# Patient Record
Sex: Male | Born: 1950 | Race: Black or African American | Hispanic: No | Marital: Single | Smoking: Current every day smoker
Health system: Southern US, Community
[De-identification: ages and names within clinical notes are randomized; demographics above are authoritative.]

## PROBLEM LIST (undated history)

## (undated) DIAGNOSIS — K469 Unspecified abdominal hernia without obstruction or gangrene: Secondary | ICD-10-CM

## (undated) DIAGNOSIS — K769 Liver disease, unspecified: Secondary | ICD-10-CM

## (undated) DIAGNOSIS — N1330 Unspecified hydronephrosis: Secondary | ICD-10-CM

## (undated) DIAGNOSIS — R569 Unspecified convulsions: Secondary | ICD-10-CM

## (undated) DIAGNOSIS — K8020 Calculus of gallbladder without cholecystitis without obstruction: Secondary | ICD-10-CM

## (undated) DIAGNOSIS — I8500 Esophageal varices without bleeding: Secondary | ICD-10-CM

## (undated) DIAGNOSIS — N888 Other specified noninflammatory disorders of cervix uteri: Secondary | ICD-10-CM

## (undated) DIAGNOSIS — F1020 Alcohol dependence, uncomplicated: Secondary | ICD-10-CM

## (undated) DIAGNOSIS — E7800 Pure hypercholesterolemia, unspecified: Secondary | ICD-10-CM

## (undated) DIAGNOSIS — C799 Secondary malignant neoplasm of unspecified site: Secondary | ICD-10-CM

## (undated) DIAGNOSIS — R918 Other nonspecific abnormal finding of lung field: Secondary | ICD-10-CM

## (undated) DIAGNOSIS — E46 Unspecified protein-calorie malnutrition: Secondary | ICD-10-CM

## (undated) DIAGNOSIS — N400 Enlarged prostate without lower urinary tract symptoms: Secondary | ICD-10-CM

## (undated) DIAGNOSIS — D649 Anemia, unspecified: Secondary | ICD-10-CM

## (undated) HISTORY — PX: APPENDECTOMY: SHX54

---

## 2000-11-24 ENCOUNTER — Encounter: Payer: Self-pay | Admitting: *Deleted

## 2000-11-24 ENCOUNTER — Emergency Department (HOSPITAL_COMMUNITY): Admission: EM | Admit: 2000-11-24 | Discharge: 2000-11-24 | Payer: Self-pay | Admitting: *Deleted

## 2003-06-19 ENCOUNTER — Emergency Department (HOSPITAL_COMMUNITY): Admission: EM | Admit: 2003-06-19 | Discharge: 2003-06-19 | Payer: Self-pay | Admitting: Emergency Medicine

## 2004-02-26 ENCOUNTER — Emergency Department (HOSPITAL_COMMUNITY): Admission: EM | Admit: 2004-02-26 | Discharge: 2004-02-26 | Payer: Self-pay | Admitting: Emergency Medicine

## 2004-06-23 ENCOUNTER — Emergency Department (HOSPITAL_COMMUNITY): Admission: EM | Admit: 2004-06-23 | Discharge: 2004-06-23 | Payer: Self-pay | Admitting: Emergency Medicine

## 2004-07-18 ENCOUNTER — Emergency Department (HOSPITAL_COMMUNITY): Admission: EM | Admit: 2004-07-18 | Discharge: 2004-07-18 | Payer: Self-pay | Admitting: Emergency Medicine

## 2006-12-03 ENCOUNTER — Emergency Department (HOSPITAL_COMMUNITY): Admission: EM | Admit: 2006-12-03 | Discharge: 2006-12-03 | Payer: Self-pay | Admitting: Emergency Medicine

## 2008-04-29 ENCOUNTER — Inpatient Hospital Stay (HOSPITAL_COMMUNITY): Admission: EM | Admit: 2008-04-29 | Discharge: 2008-05-07 | Payer: Self-pay | Admitting: Emergency Medicine

## 2008-05-04 ENCOUNTER — Ambulatory Visit: Payer: Self-pay | Admitting: Internal Medicine

## 2008-05-06 ENCOUNTER — Ambulatory Visit: Payer: Self-pay | Admitting: Internal Medicine

## 2008-06-03 ENCOUNTER — Encounter: Payer: Self-pay | Admitting: Internal Medicine

## 2008-07-31 ENCOUNTER — Emergency Department (HOSPITAL_COMMUNITY): Admission: EM | Admit: 2008-07-31 | Discharge: 2008-07-31 | Payer: Self-pay | Admitting: Emergency Medicine

## 2008-08-13 ENCOUNTER — Inpatient Hospital Stay (HOSPITAL_COMMUNITY): Admission: EM | Admit: 2008-08-13 | Discharge: 2008-08-20 | Payer: Self-pay | Admitting: Emergency Medicine

## 2008-08-21 ENCOUNTER — Emergency Department (HOSPITAL_COMMUNITY): Admission: EM | Admit: 2008-08-21 | Discharge: 2008-08-21 | Payer: Self-pay | Admitting: Emergency Medicine

## 2009-08-19 ENCOUNTER — Emergency Department (HOSPITAL_COMMUNITY): Admission: RE | Admit: 2009-08-19 | Discharge: 2009-08-19 | Payer: Self-pay | Admitting: Family Medicine

## 2010-01-03 ENCOUNTER — Ambulatory Visit: Payer: Self-pay | Admitting: Cardiology

## 2010-01-03 ENCOUNTER — Inpatient Hospital Stay (HOSPITAL_COMMUNITY): Admission: EM | Admit: 2010-01-03 | Discharge: 2010-01-06 | Payer: Self-pay | Admitting: Emergency Medicine

## 2010-01-04 ENCOUNTER — Encounter: Payer: Self-pay | Admitting: Cardiology

## 2010-01-13 ENCOUNTER — Ambulatory Visit: Payer: Self-pay | Admitting: Cardiology

## 2010-01-13 ENCOUNTER — Encounter (HOSPITAL_COMMUNITY)
Admission: RE | Admit: 2010-01-13 | Discharge: 2010-02-12 | Payer: Self-pay | Source: Home / Self Care | Admitting: Cardiology

## 2010-05-22 ENCOUNTER — Inpatient Hospital Stay (HOSPITAL_COMMUNITY)
Admission: RE | Admit: 2010-05-22 | Discharge: 2010-05-26 | DRG: 638 | Disposition: A | Discharge status: Home Health | Payer: Medicare Other | Source: Ambulatory Visit | Attending: Internal Medicine | Admitting: Internal Medicine

## 2010-05-22 DIAGNOSIS — N390 Urinary tract infection, site not specified: Secondary | ICD-10-CM | POA: Diagnosis present

## 2010-05-22 DIAGNOSIS — IMO0001 Reserved for inherently not codable concepts without codable children: Principal | ICD-10-CM | POA: Diagnosis present

## 2010-05-22 DIAGNOSIS — E871 Hypo-osmolality and hyponatremia: Secondary | ICD-10-CM | POA: Diagnosis present

## 2010-05-22 DIAGNOSIS — B379 Candidiasis, unspecified: Secondary | ICD-10-CM | POA: Diagnosis present

## 2010-05-22 DIAGNOSIS — R269 Unspecified abnormalities of gait and mobility: Secondary | ICD-10-CM | POA: Diagnosis present

## 2010-05-22 DIAGNOSIS — I10 Essential (primary) hypertension: Secondary | ICD-10-CM | POA: Diagnosis present

## 2010-05-22 DIAGNOSIS — Z794 Long term (current) use of insulin: Secondary | ICD-10-CM

## 2010-05-22 LAB — CBC
HCT: 41.5 % (ref 39.0–52.0)
Hemoglobin: 13.7 g/dL (ref 13.0–17.0)
MCHC: 33 g/dL (ref 30.0–36.0)
MCV: 79.3 fL (ref 78.0–100.0)
RDW: 13.1 % (ref 11.5–15.5)

## 2010-05-22 LAB — BASIC METABOLIC PANEL
CO2: 26 mEq/L (ref 19–32)
Calcium: 9.2 mg/dL (ref 8.4–10.5)
Creatinine, Ser: 0.96 mg/dL (ref 0.4–1.5)
GFR calc Af Amer: >60 mL/min (ref >60)
GFR calc non Af Amer: >60 mL/min (ref >60)
Sodium: 124 mEq/L — ABNORMAL LOW (ref 135–145)

## 2010-05-22 LAB — URINE MICROSCOPIC-ADD ON

## 2010-05-22 LAB — URINALYSIS, ROUTINE W REFLEX MICROSCOPIC
Bilirubin Urine: NEGATIVE
Glucose, UA: >1000 mg/dL — AB
Hgb urine dipstick: NEGATIVE
Specific Gravity, Urine: <1.005 — ABNORMAL LOW (ref 1.005–1.030)
Urobilinogen, UA: 0.2 mg/dL (ref 0.0–1.0)

## 2010-05-22 LAB — DIFFERENTIAL
Eosinophils Relative: 1 % (ref 0–5)
Lymphocytes Relative: 43 % (ref 12–46)
Lymphs Abs: 2.9 10*3/uL (ref 0.7–4.0)
Monocytes Absolute: 0.3 10*3/uL (ref 0.1–1.0)

## 2010-05-22 LAB — GLUCOSE, CAPILLARY
Glucose-Capillary: 309 mg/dL — ABNORMAL HIGH (ref 70–99)
Glucose-Capillary: 503 mg/dL — ABNORMAL HIGH (ref 70–99)
Glucose-Capillary: >600 mg/dL (ref 70–99)

## 2010-05-23 ENCOUNTER — Inpatient Hospital Stay (HOSPITAL_COMMUNITY): Payer: Medicare Other

## 2010-05-23 LAB — GLUCOSE, CAPILLARY
Glucose-Capillary: 198 mg/dL — ABNORMAL HIGH (ref 70–99)
Glucose-Capillary: 148 mg/dL — ABNORMAL HIGH (ref 70–99)
Glucose-Capillary: 159 mg/dL — ABNORMAL HIGH (ref 70–99)
Glucose-Capillary: 268 mg/dL — ABNORMAL HIGH (ref 70–99)

## 2010-05-23 LAB — LACTIC ACID, PLASMA: Lactic Acid, Venous: 1.1 mmol/L (ref 0.5–2.2)

## 2010-05-23 LAB — CARDIAC PANEL(CRET KIN+CKTOT+MB+TROPI)
CK, MB: 1.6 ng/mL (ref 0.3–4.0)
Relative Index: INVALID (ref 0.0–2.5)
Total CK: 27 U/L (ref 7–232)
Total CK: 31 U/L (ref 7–232)
Troponin I: 0.01 ng/mL (ref 0.00–0.06)
Troponin I: 0.02 ng/mL (ref 0.00–0.06)

## 2010-05-23 LAB — CBC
HCT: 38 % — ABNORMAL LOW (ref 39.0–52.0)
Hemoglobin: 13.2 g/dL (ref 13.0–17.0)
MCH: 27.2 pg (ref 26.0–34.0)
MCV: 78.2 fL (ref 78.0–100.0)
RBC: 4.86 MIL/uL (ref 4.22–5.81)

## 2010-05-23 LAB — DIFFERENTIAL
Lymphocytes Relative: 49 % — ABNORMAL HIGH (ref 12–46)
Lymphs Abs: 4.1 10*3/uL — ABNORMAL HIGH (ref 0.7–4.0)
Monocytes Relative: 8 % (ref 3–12)
Neutro Abs: 3.5 10*3/uL (ref 1.7–7.7)
Neutrophils Relative %: 41 % — ABNORMAL LOW (ref 43–77)

## 2010-05-23 LAB — COMPREHENSIVE METABOLIC PANEL
ALT: 30 U/L (ref 0–53)
AST: 28 U/L (ref 0–37)
Calcium: 9 mg/dL (ref 8.4–10.5)
GFR calc Af Amer: >60 mL/min (ref >60)
Sodium: 133 mEq/L — ABNORMAL LOW (ref 135–145)
Total Protein: 6.6 g/dL (ref 6.0–8.3)
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
## 2010-05-24 LAB — CBC
Hemoglobin: 12.3 g/dL — ABNORMAL LOW (ref 13.0–17.0)
MCH: 27.2 pg (ref 26.0–34.0)
Platelets: 176 10*3/uL (ref 150–400)
RBC: 4.52 MIL/uL (ref 4.22–5.81)
WBC: 6.5 10*3/uL (ref 4.0–10.5)

## 2010-05-24 LAB — DIFFERENTIAL
Basophils Absolute: 0 10*3/uL (ref 0.0–0.1)
Basophils Relative: 0 % (ref 0–1)
Monocytes Relative: 8 % (ref 3–12)
Neutro Abs: 2.6 10*3/uL (ref 1.7–7.7)
Neutrophils Relative %: 40 % — ABNORMAL LOW (ref 43–77)

## 2010-05-24 LAB — BASIC METABOLIC PANEL
CO2: 25 mEq/L (ref 19–32)
Chloride: 98 mEq/L (ref 96–112)
GFR calc Af Amer: >60 mL/min (ref >60)
Potassium: 3.5 mEq/L (ref 3.5–5.1)
Sodium: 129 mEq/L — ABNORMAL LOW (ref 135–145)

## 2010-05-24 LAB — URINE CULTURE
Colony Count: 75000
Culture  Setup Time: 201203042325

## 2010-05-24 LAB — HEMOGLOBIN A1C
Hgb A1c MFr Bld: >20 % — ABNORMAL HIGH (ref <5.7)
Mean Plasma Glucose: 484 mg/dL — ABNORMAL HIGH (ref <117)

## 2010-05-24 LAB — GLUCOSE, CAPILLARY
Glucose-Capillary: 58 mg/dL — ABNORMAL LOW (ref 70–99)
Glucose-Capillary: 281 mg/dL — ABNORMAL HIGH (ref 70–99)
Glucose-Capillary: 218 mg/dL — ABNORMAL HIGH (ref 70–99)

## 2010-05-25 LAB — BASIC METABOLIC PANEL
Chloride: 104 mEq/L (ref 96–112)
GFR calc Af Amer: >60 mL/min (ref >60)
Potassium: 3.5 mEq/L (ref 3.5–5.1)
Sodium: 137 mEq/L (ref 135–145)

## 2010-05-25 LAB — GLUCOSE, CAPILLARY
Glucose-Capillary: 131 mg/dL — ABNORMAL HIGH (ref 70–99)
Glucose-Capillary: 336 mg/dL — ABNORMAL HIGH (ref 70–99)
Glucose-Capillary: 182 mg/dL — ABNORMAL HIGH (ref 70–99)

## 2010-05-25 LAB — CBC
Hemoglobin: 11.9 g/dL — ABNORMAL LOW (ref 13.0–17.0)
MCH: 27.2 pg (ref 26.0–34.0)
MCV: 79.5 fL (ref 78.0–100.0)
RBC: 4.38 MIL/uL (ref 4.22–5.81)
WBC: 5.9 10*3/uL (ref 4.0–10.5)

## 2010-05-25 LAB — DIFFERENTIAL
Lymphocytes Relative: 42 % (ref 12–46)
Lymphs Abs: 2.5 10*3/uL (ref 0.7–4.0)
Monocytes Relative: 8 % (ref 3–12)
Neutro Abs: 2.8 10*3/uL (ref 1.7–7.7)
Neutrophils Relative %: 48 % (ref 43–77)

## 2010-05-26 LAB — CBC
HCT: 35.1 % — ABNORMAL LOW (ref 39.0–52.0)
MCHC: 33.9 g/dL (ref 30.0–36.0)
RDW: 13.6 % (ref 11.5–15.5)
WBC: 5.8 10*3/uL (ref 4.0–10.5)

## 2010-05-26 LAB — GLUCOSE, CAPILLARY
Glucose-Capillary: 125 mg/dL — ABNORMAL HIGH (ref 70–99)
Glucose-Capillary: 81 mg/dL (ref 70–99)

## 2010-05-26 LAB — DIFFERENTIAL
Basophils Absolute: 0 10*3/uL (ref 0.0–0.1)
Basophils Relative: 0 % (ref 0–1)
Eosinophils Relative: 2 % (ref 0–5)
Lymphocytes Relative: 45 % (ref 12–46)
Monocytes Absolute: 0.4 10*3/uL (ref 0.1–1.0)
Neutro Abs: 2.6 10*3/uL (ref 1.7–7.7)

## 2010-05-26 LAB — COMPREHENSIVE METABOLIC PANEL
ALT: 20 U/L (ref 0–53)
AST: 32 U/L (ref 0–37)
Albumin: 2.6 g/dL — ABNORMAL LOW (ref 3.5–5.2)
Alkaline Phosphatase: 109 U/L (ref 39–117)
Calcium: 8.6 mg/dL (ref 8.4–10.5)
GFR calc Af Amer: >60 mL/min (ref >60)
Glucose, Bld: 84 mg/dL (ref 70–99)
Potassium: 3.7 mEq/L (ref 3.5–5.1)
Sodium: 135 mEq/L (ref 135–145)
Total Protein: 5.8 g/dL — ABNORMAL LOW (ref 6.0–8.3)

## 2010-05-27 NOTE — Discharge Summary (Signed)
  NAME:  [name], [name]               ACCOUNT NO.:  [id number]  MEDICAL RECORD NO.:  [id number]           PATIENT TYPE:  I  LOCATION:  A209                          FACILITY:  [facility]  PHYSICIAN:  Wilson Singer, M.D.DATE OF BIRTH:  1950-07-20  DATE OF ADMISSION:  05/22/2010 DATE OF DISCHARGE:  03/08/2012LH                              DISCHARGE SUMMARY   FINAL DISCHARGE DIAGNOSIS:  Uncontrolled diabetes with initial blood glucose of 642, not in ketoacidosis, resolved.  CONDITION ON DISCHARGE:  Stable.  MEDICATIONS ON DISCHARGE:  Lantus insulin 10 units daily.  HISTORY:  This is an unfortunate 60 year old African American man with a history of hypertension and diabetes and apparently has seizure disorder, presented with difficulty in walking and excessive thirst of unspecified duration.  The patient when he presented had a bicarbonate of 26, but glucose of 642, BUN of 20, and creatinine 0.96.  Please see initial history and physical examination done by Dr. Beverly Gust.  HOSPITAL COURSE:  The patient was given intravenous fluids and intravenous insulin initially.  He did very well with this and stabilized over the next few days.  Unfortunately he cannot recall his medications but he does know that he is taking insulin injections.  As far as dosage is concerned it is not entirely clear and 10 units of Lantus insulin seems to have been controlling sugars reasonably well. Interestingly, his hemoglobin A1c is greater than 20%!  This would indicate severe lack of control of his diabetes.  Today he is doing well, eating well without any major problems.  PHYSICAL EXAMINATION:  VITAL SIGNS:  His temperature is 99, blood pressure 100/64, pulse 83, and saturation 98% on room air. 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 HEART: Sounds are present and normal. LUNG:  Fields are clear.  INVESTIGATIONS:   Today show sodium of 135, potassium 3.7, bicarbonate 26, BUN 9, creatinine 0.72, albumin 2.6, hemoglobin 11.9, white blood cell count 5.8, and platelets 152.  His CBGs are coming under much better control although there have been some high sugars in the upper 200s but overall they are in the range of 100-125.  DISPOSITION:  The patient appears to be stable to be discharged home.  I believe the home situation is a little bit challenging because he apparently lives with a niece to my understanding.  The niece apparently manages his finances and so we will try and work with Home Health Care on this in terms of checking his sugars, making sure he is getting the right amount of insulin at home.  He does need followup with a primary care physician.     Wilson Singer, M.D.     NCG/MEDQ  D:  05/26/2010  T:  05/27/2010  Job:  [id number]  Electronically Signed by Lilly Cove M.D. on 05/27/2010 12:40:00 PM

## 2010-05-28 LAB — CULTURE, BLOOD (ROUTINE X 2): Culture: NO GROWTH

## 2010-05-29 NOTE — Discharge Summary (Signed)
  NAME:  [name], [name]               ACCOUNT NO.:  [id number]  MEDICAL RECORD NO.:  [id number]           PATIENT TYPE:  LOCATION:                                 FACILITY:  PHYSICIAN:  Wilson Singer, M.D.DATE OF BIRTH:  Aug 24, 1950  DATE OF ADMISSION: DATE OF DISCHARGE:  LH                              DISCHARGE SUMMARY   ADDENDUM  I have located the patient's home medications and he should actually continue on home medications listed below:  1. Ativan 1 mg b.i.d. 2. Aspirin 81 mg daily. 3. Folic acid 1 mg daily. 4. Mucinex 1 tablet b.i.d. 5. Lantus insulin 28 units daily nightly. 6. Metoprolol 25 mg half a tablet b.i.d. 7. Multivitamins 1 tablet daily. 8. Saxagliptin 5 mg daily. 9. Thiamine 100 mg daily.  He clearly needs to follow up with his primary care physician in the next 1-2 weeks.     Wilson Singer, M.D.     NCG/MEDQ  D:  05/26/2010  T:  05/27/2010  Job:  [id number]  Electronically Signed by Lilly Cove M.D. on 05/29/2010 12:45:19 PM

## 2010-06-01 LAB — RAPID URINE DRUG SCREEN, HOSP PERFORMED
Amphetamines: NOT DETECTED
Barbiturates: NOT DETECTED
Benzodiazepines: NOT DETECTED
Cocaine: NOT DETECTED
Opiates: NOT DETECTED
Tetrahydrocannabinol: NOT DETECTED

## 2010-06-01 LAB — CBC
HCT: 34.3 % — ABNORMAL LOW (ref 39.0–52.0)
HCT: 39 % (ref 39.0–52.0)
HCT: 39.5 % (ref 39.0–52.0)
Hemoglobin: 11.4 g/dL — ABNORMAL LOW (ref 13.0–17.0)
Hemoglobin: 12.8 g/dL — ABNORMAL LOW (ref 13.0–17.0)
MCH: 28 pg (ref 26.0–34.0)
MCH: 28.1 pg (ref 26.0–34.0)
MCHC: 32.9 g/dL (ref 30.0–36.0)
MCHC: 33 g/dL (ref 30.0–36.0)
MCHC: 33.2 g/dL (ref 30.0–36.0)
MCV: 84.8 fL (ref 78.0–100.0)
MCV: 84.8 fL (ref 78.0–100.0)
MCV: 85.1 fL (ref 78.0–100.0)
Platelets: 114 10*3/uL — ABNORMAL LOW (ref 150–400)
Platelets: 138 10*3/uL — ABNORMAL LOW (ref 150–400)
RBC: 4.04 MIL/uL — ABNORMAL LOW (ref 4.22–5.81)
RBC: 4.59 MIL/uL (ref 4.22–5.81)
RDW: 13.3 % (ref 11.5–15.5)
RDW: 13.3 % (ref 11.5–15.5)
RDW: 13.5 % (ref 11.5–15.5)
WBC: 9.4 10*3/uL (ref 4.0–10.5)
WBC: 9.6 10*3/uL (ref 4.0–10.5)

## 2010-06-01 LAB — BASIC METABOLIC PANEL
BUN: 12 mg/dL (ref 6–23)
BUN: 7 mg/dL (ref 6–23)
BUN: 7 mg/dL (ref 6–23)
CO2: 25 mEq/L (ref 19–32)
CO2: 28 mEq/L (ref 19–32)
Calcium: 8 mg/dL — ABNORMAL LOW (ref 8.4–10.5)
Calcium: 9.1 mg/dL (ref 8.4–10.5)
Chloride: 100 mEq/L (ref 96–112)
Chloride: 87 mEq/L — ABNORMAL LOW (ref 96–112)
Chloride: 96 mEq/L (ref 96–112)
Creatinine, Ser: 0.55 mg/dL (ref 0.4–1.5)
Creatinine, Ser: 0.58 mg/dL (ref 0.4–1.5)
Creatinine, Ser: 0.68 mg/dL (ref 0.4–1.5)
GFR calc Af Amer: >60 mL/min (ref >60)
GFR calc Af Amer: >60 mL/min (ref >60)
GFR calc non Af Amer: >60 mL/min (ref >60)
GFR calc non Af Amer: >60 mL/min (ref >60)
GFR calc non Af Amer: >60 mL/min (ref >60)
Glucose, Bld: 195 mg/dL — ABNORMAL HIGH (ref 70–99)
Glucose, Bld: 317 mg/dL — ABNORMAL HIGH (ref 70–99)
Glucose, Bld: 401 mg/dL — ABNORMAL HIGH (ref 70–99)
Potassium: 3.8 mEq/L (ref 3.5–5.1)
Potassium: 4.1 mEq/L (ref 3.5–5.1)
Potassium: 4.2 mEq/L (ref 3.5–5.1)
Sodium: 128 mEq/L — ABNORMAL LOW (ref 135–145)
Sodium: 137 mEq/L (ref 135–145)

## 2010-06-01 LAB — CARDIAC PANEL(CRET KIN+CKTOT+MB+TROPI)
CK, MB: 10.1 ng/mL (ref 0.3–4.0)
CK, MB: 3.4 ng/mL (ref 0.3–4.0)
CK, MB: 4.1 ng/mL — ABNORMAL HIGH (ref 0.3–4.0)
CK, MB: 4.5 ng/mL — ABNORMAL HIGH (ref 0.3–4.0)
Relative Index: INVALID (ref 0.0–2.5)
Relative Index: INVALID (ref 0.0–2.5)
Total CK: 28 U/L (ref 7–232)
Total CK: 31 U/L (ref 7–232)
Troponin I: 0.01 ng/mL (ref 0.00–0.06)
Troponin I: 0.25 ng/mL — ABNORMAL HIGH (ref 0.00–0.06)

## 2010-06-01 LAB — GLUCOSE, CAPILLARY
Glucose-Capillary: 104 mg/dL — ABNORMAL HIGH (ref 70–99)
Glucose-Capillary: 141 mg/dL — ABNORMAL HIGH (ref 70–99)
Glucose-Capillary: 178 mg/dL — ABNORMAL HIGH (ref 70–99)
Glucose-Capillary: 180 mg/dL — ABNORMAL HIGH (ref 70–99)
Glucose-Capillary: 234 mg/dL — ABNORMAL HIGH (ref 70–99)
Glucose-Capillary: 279 mg/dL — ABNORMAL HIGH (ref 70–99)
Glucose-Capillary: 302 mg/dL — ABNORMAL HIGH (ref 70–99)
Glucose-Capillary: >600 mg/dL (ref 70–99)
Glucose-Capillary: >600 mg/dL (ref 70–99)

## 2010-06-01 LAB — DIFFERENTIAL
Basophils Absolute: 0 10*3/uL (ref 0.0–0.1)
Basophils Absolute: 0 10*3/uL (ref 0.0–0.1)
Basophils Absolute: 0 10*3/uL (ref 0.0–0.1)
Basophils Relative: 0 % (ref 0–1)
Basophils Relative: 0 % (ref 0–1)
Basophils Relative: 0 % (ref 0–1)
Eosinophils Absolute: 0 10*3/uL (ref 0.0–0.7)
Eosinophils Absolute: 0 10*3/uL (ref 0.0–0.7)
Eosinophils Relative: 0 % (ref 0–5)
Eosinophils Relative: 0 % (ref 0–5)
Eosinophils Relative: 0 % (ref 0–5)
Lymphocytes Relative: 29 % (ref 12–46)
Lymphocytes Relative: 7 % — ABNORMAL LOW (ref 12–46)
Lymphs Abs: 0.6 10*3/uL — ABNORMAL LOW (ref 0.7–4.0)
Lymphs Abs: 2.8 10*3/uL (ref 0.7–4.0)
Monocytes Absolute: 0.2 10*3/uL (ref 0.1–1.0)
Monocytes Absolute: 0.6 10*3/uL (ref 0.1–1.0)
Monocytes Absolute: 0.7 10*3/uL (ref 0.1–1.0)
Monocytes Relative: 3 % (ref 3–12)
Monocytes Relative: 6 % (ref 3–12)
Neutro Abs: 6.2 10*3/uL (ref 1.7–7.7)
Neutro Abs: 8.5 10*3/uL — ABNORMAL HIGH (ref 1.7–7.7)
Neutrophils Relative %: 65 % (ref 43–77)
Neutrophils Relative %: 91 % — ABNORMAL HIGH (ref 43–77)

## 2010-06-01 LAB — HEPARIN LEVEL (UNFRACTIONATED)
Heparin Unfractionated: 0.23 IU/mL — ABNORMAL LOW (ref 0.30–0.70)
Heparin Unfractionated: 0.31 IU/mL (ref 0.30–0.70)
Heparin Unfractionated: 0.45 IU/mL (ref 0.30–0.70)
Heparin Unfractionated: <0.1 IU/mL — ABNORMAL LOW (ref 0.30–0.70)

## 2010-06-01 LAB — POCT CARDIAC MARKERS
CKMB, poc: <1 ng/mL — ABNORMAL LOW (ref 1.0–8.0)
CKMB, poc: <1 ng/mL — ABNORMAL LOW (ref 1.0–8.0)
Myoglobin, poc: 50.4 ng/mL (ref 12–200)
Troponin i, poc: <0.05 ng/mL (ref 0.00–0.09)
Troponin i, poc: <0.05 ng/mL (ref 0.00–0.09)

## 2010-06-01 LAB — COMPREHENSIVE METABOLIC PANEL
ALT: 50 U/L (ref 0–53)
AST: 55 U/L — ABNORMAL HIGH (ref 0–37)
Albumin: 3 g/dL — ABNORMAL LOW (ref 3.5–5.2)
Alkaline Phosphatase: 196 U/L — ABNORMAL HIGH (ref 39–117)
BUN: 8 mg/dL (ref 6–23)
CO2: 27 mEq/L (ref 19–32)
Calcium: 8.7 mg/dL (ref 8.4–10.5)
Chloride: 91 mEq/L — ABNORMAL LOW (ref 96–112)
Creatinine, Ser: 0.65 mg/dL (ref 0.4–1.5)
GFR calc Af Amer: >60 mL/min (ref >60)
GFR calc non Af Amer: >60 mL/min (ref >60)
Glucose, Bld: 395 mg/dL — ABNORMAL HIGH (ref 70–99)
Potassium: 4.2 mEq/L (ref 3.5–5.1)
Sodium: 128 mEq/L — ABNORMAL LOW (ref 135–145)
Total Bilirubin: 0.9 mg/dL (ref 0.3–1.2)
Total Protein: 7.7 g/dL (ref 6.0–8.3)

## 2010-06-01 LAB — GLUCOSE, RANDOM
Glucose, Bld: 331 mg/dL — ABNORMAL HIGH (ref 70–99)
Glucose, Bld: 401 mg/dL — ABNORMAL HIGH (ref 70–99)
Glucose, Bld: 498 mg/dL — ABNORMAL HIGH (ref 70–99)

## 2010-06-01 LAB — HEMOGLOBIN A1C
Hgb A1c MFr Bld: 17.8 % — ABNORMAL HIGH (ref <5.7)
Mean Plasma Glucose: 464 mg/dL — ABNORMAL HIGH (ref <117)

## 2010-06-01 LAB — LIPID PANEL
LDL Cholesterol: 58 mg/dL (ref 0–99)
VLDL: 13 mg/dL (ref 0–40)

## 2010-06-01 LAB — BRAIN NATRIURETIC PEPTIDE
Pro B Natriuretic peptide (BNP): <30 pg/mL (ref 0.0–100.0)
Pro B Natriuretic peptide (BNP): <30 pg/mL (ref 0.0–100.0)

## 2010-06-01 LAB — ETHANOL: Alcohol, Ethyl (B): <5 mg/dL (ref 0–10)

## 2010-06-01 LAB — TSH: TSH: 0.413 u[IU]/mL (ref 0.350–4.500)

## 2010-06-01 LAB — D-DIMER, QUANTITATIVE: D-Dimer, Quant: 0.65 ug/mL-FEU — ABNORMAL HIGH (ref 0.00–0.48)

## 2010-06-27 LAB — BASIC METABOLIC PANEL
BUN: 6 mg/dL (ref 6–23)
Calcium: 9.1 mg/dL (ref 8.4–10.5)
Chloride: 96 mEq/L (ref 96–112)
Chloride: 98 mEq/L (ref 96–112)
Creatinine, Ser: 0.72 mg/dL (ref 0.4–1.5)
GFR calc Af Amer: >60 mL/min (ref >60)
GFR calc non Af Amer: >60 mL/min (ref >60)
GFR calc non Af Amer: >60 mL/min (ref >60)
Glucose, Bld: 167 mg/dL — ABNORMAL HIGH (ref 70–99)
Potassium: 3.8 mEq/L (ref 3.5–5.1)
Sodium: 133 mEq/L — ABNORMAL LOW (ref 135–145)

## 2010-06-27 LAB — GLUCOSE, CAPILLARY
Glucose-Capillary: 189 mg/dL — ABNORMAL HIGH (ref 70–99)
Glucose-Capillary: 198 mg/dL — ABNORMAL HIGH (ref 70–99)
Glucose-Capillary: 227 mg/dL — ABNORMAL HIGH (ref 70–99)
Glucose-Capillary: 273 mg/dL — ABNORMAL HIGH (ref 70–99)
Glucose-Capillary: 274 mg/dL — ABNORMAL HIGH (ref 70–99)
Glucose-Capillary: 352 mg/dL — ABNORMAL HIGH (ref 70–99)
Glucose-Capillary: 382 mg/dL — ABNORMAL HIGH (ref 70–99)

## 2010-06-27 LAB — DIFFERENTIAL
Basophils Absolute: 0 10*3/uL (ref 0.0–0.1)
Eosinophils Absolute: 0.2 10*3/uL (ref 0.0–0.7)
Eosinophils Relative: 3 % (ref 0–5)
Lymphocytes Relative: 36 % (ref 12–46)
Lymphocytes Relative: 41 % (ref 12–46)
Lymphs Abs: 2.1 10*3/uL (ref 0.7–4.0)
Lymphs Abs: 3.1 10*3/uL (ref 0.7–4.0)
Monocytes Absolute: 0.7 10*3/uL (ref 0.1–1.0)
Monocytes Relative: 14 % — ABNORMAL HIGH (ref 3–12)
Neutro Abs: 2.6 10*3/uL (ref 1.7–7.7)
Neutrophils Relative %: 46 % (ref 43–77)

## 2010-06-27 LAB — URINALYSIS, ROUTINE W REFLEX MICROSCOPIC
Bilirubin Urine: NEGATIVE
Leukocytes, UA: NEGATIVE
Nitrite: NEGATIVE
Specific Gravity, Urine: 1.01 (ref 1.005–1.030)
pH: 6 (ref 5.0–8.0)

## 2010-06-27 LAB — CBC
HCT: 37.4 % — ABNORMAL LOW (ref 39.0–52.0)
Hemoglobin: 12.6 g/dL — ABNORMAL LOW (ref 13.0–17.0)
MCV: 83.4 fL (ref 78.0–100.0)
Platelets: 174 10*3/uL (ref 150–400)
RBC: 4.08 MIL/uL — ABNORMAL LOW (ref 4.22–5.81)
RDW: 14.7 % (ref 11.5–15.5)
WBC: 5.7 10*3/uL (ref 4.0–10.5)

## 2010-06-27 LAB — URINE MICROSCOPIC-ADD ON: Urine-Other: NONE SEEN

## 2010-06-28 LAB — CARDIAC PANEL(CRET KIN+CKTOT+MB+TROPI)
CK, MB: 0.7 ng/mL (ref 0.3–4.0)
CK, MB: 0.8 ng/mL (ref 0.3–4.0)
CK, MB: 1.9 ng/mL (ref 0.3–4.0)
Relative Index: 1.1 (ref 0.0–2.5)
Relative Index: INVALID (ref 0.0–2.5)
Relative Index: INVALID (ref 0.0–2.5)
Relative Index: INVALID (ref 0.0–2.5)
Relative Index: INVALID (ref 0.0–2.5)
Total CK: 34 U/L (ref 7–232)
Total CK: 37 U/L (ref 7–232)
Total CK: 64 U/L (ref 7–232)
Troponin I: 0.02 ng/mL (ref 0.00–0.06)
Troponin I: 0.02 ng/mL (ref 0.00–0.06)
Troponin I: 0.02 ng/mL (ref 0.00–0.06)
Troponin I: 0.03 ng/mL (ref 0.00–0.06)
Troponin I: 0.03 ng/mL (ref 0.00–0.06)
Troponin I: 0.03 ng/mL (ref 0.00–0.06)
Troponin I: 0.03 ng/mL (ref 0.00–0.06)

## 2010-06-28 LAB — DIFFERENTIAL
Basophils Absolute: 0 10*3/uL (ref 0.0–0.1)
Basophils Absolute: 0.1 10*3/uL (ref 0.0–0.1)
Basophils Absolute: 0.1 10*3/uL (ref 0.0–0.1)
Basophils Absolute: 0 10*3/uL (ref 0.0–0.1)
Basophils Relative: 1 % (ref 0–1)
Basophils Relative: 1 % (ref 0–1)
Basophils Relative: 1 % (ref 0–1)
Eosinophils Absolute: 0.1 10*3/uL (ref 0.0–0.7)
Eosinophils Absolute: 0.2 10*3/uL (ref 0.0–0.7)
Eosinophils Absolute: 0.2 10*3/uL (ref 0.0–0.7)
Eosinophils Relative: 2 % (ref 0–5)
Eosinophils Relative: 2 % (ref 0–5)
Lymphocytes Relative: 39 % (ref 12–46)
Lymphs Abs: 3.3 10*3/uL (ref 0.7–4.0)
Lymphs Abs: 3.3 10*3/uL (ref 0.7–4.0)
Monocytes Absolute: 0.6 10*3/uL (ref 0.1–1.0)
Monocytes Absolute: 0.7 10*3/uL (ref 0.1–1.0)
Monocytes Relative: 6 % (ref 3–12)
Monocytes Relative: 7 % (ref 3–12)
Monocytes Relative: 8 % (ref 3–12)
Neutro Abs: 4.2 10*3/uL (ref 1.7–7.7)
Neutro Abs: 6.1 10*3/uL (ref 1.7–7.7)
Neutro Abs: 3.4 10*3/uL (ref 1.7–7.7)
Neutrophils Relative %: 52 % (ref 43–77)

## 2010-06-28 LAB — APTT: aPTT: 33 seconds (ref 24–37)

## 2010-06-28 LAB — GLUCOSE, CAPILLARY
Glucose-Capillary: 107 mg/dL — ABNORMAL HIGH (ref 70–99)
Glucose-Capillary: 109 mg/dL — ABNORMAL HIGH (ref 70–99)
Glucose-Capillary: 116 mg/dL — ABNORMAL HIGH (ref 70–99)
Glucose-Capillary: 124 mg/dL — ABNORMAL HIGH (ref 70–99)
Glucose-Capillary: 152 mg/dL — ABNORMAL HIGH (ref 70–99)
Glucose-Capillary: 189 mg/dL — ABNORMAL HIGH (ref 70–99)
Glucose-Capillary: 268 mg/dL — ABNORMAL HIGH (ref 70–99)
Glucose-Capillary: 270 mg/dL — ABNORMAL HIGH (ref 70–99)
Glucose-Capillary: 93 mg/dL (ref 70–99)
Glucose-Capillary: 95 mg/dL (ref 70–99)
Glucose-Capillary: 203 mg/dL — ABNORMAL HIGH (ref 70–99)

## 2010-06-28 LAB — URINALYSIS, ROUTINE W REFLEX MICROSCOPIC
Glucose, UA: >1000 mg/dL — AB
Hgb urine dipstick: NEGATIVE
Leukocytes, UA: NEGATIVE
Protein, ur: NEGATIVE mg/dL
pH: 5 (ref 5.0–8.0)

## 2010-06-28 LAB — CBC
HCT: 39.3 % (ref 39.0–52.0)
HCT: 39.8 % (ref 39.0–52.0)
Hemoglobin: 13.5 g/dL (ref 13.0–17.0)
MCHC: 34.2 g/dL (ref 30.0–36.0)
MCV: 82.9 fL (ref 78.0–100.0)
MCV: 83.9 fL (ref 78.0–100.0)
Platelets: 135 10*3/uL — ABNORMAL LOW (ref 150–400)
Platelets: 135 10*3/uL — ABNORMAL LOW (ref 150–400)
Platelets: 99 10*3/uL — ABNORMAL LOW (ref 150–400)
Platelets: 168 10*3/uL (ref 150–400)
RBC: 4.75 MIL/uL (ref 4.22–5.81)
RDW: 14.5 % (ref 11.5–15.5)
RDW: 14.6 % (ref 11.5–15.5)
RDW: 14.4 % (ref 11.5–15.5)
WBC: 10.4 10*3/uL (ref 4.0–10.5)
WBC: 11.3 10*3/uL — ABNORMAL HIGH (ref 4.0–10.5)
WBC: 9.8 10*3/uL (ref 4.0–10.5)

## 2010-06-28 LAB — BLOOD GAS, ARTERIAL
Patient temperature: 37
TCO2: 18.9 mmol/L (ref 0–100)
pCO2 arterial: 45.8 mmHg — ABNORMAL HIGH (ref 35.0–45.0)
pH, Arterial: 7.28 — ABNORMAL LOW (ref 7.350–7.450)

## 2010-06-28 LAB — BASIC METABOLIC PANEL
BUN: 2 mg/dL — ABNORMAL LOW (ref 6–23)
Calcium: 8.9 mg/dL (ref 8.4–10.5)
Calcium: 9 mg/dL (ref 8.4–10.5)
Calcium: 9.2 mg/dL (ref 8.4–10.5)
Chloride: 103 mEq/L (ref 96–112)
Creatinine, Ser: 0.66 mg/dL (ref 0.4–1.5)
Creatinine, Ser: 0.69 mg/dL (ref 0.4–1.5)
GFR calc Af Amer: >60 mL/min (ref >60)
GFR calc Af Amer: >60 mL/min (ref >60)
GFR calc non Af Amer: >60 mL/min (ref >60)
GFR calc non Af Amer: >60 mL/min (ref >60)
Glucose, Bld: 323 mg/dL — ABNORMAL HIGH (ref 70–99)
Potassium: 3.7 mEq/L (ref 3.5–5.1)
Sodium: 138 mEq/L (ref 135–145)
Sodium: 132 mEq/L — ABNORMAL LOW (ref 135–145)

## 2010-06-28 LAB — LIPID PANEL
Cholesterol: 145 mg/dL (ref 0–200)
LDL Cholesterol: 85 mg/dL (ref 0–99)
Total CHOL/HDL Ratio: 3 RATIO

## 2010-06-28 LAB — COMPREHENSIVE METABOLIC PANEL
ALT: 16 U/L (ref 0–53)
AST: 31 U/L (ref 0–37)
AST: 48 U/L — ABNORMAL HIGH (ref 0–37)
Albumin: 2.7 g/dL — ABNORMAL LOW (ref 3.5–5.2)
Albumin: 3 g/dL — ABNORMAL LOW (ref 3.5–5.2)
Alkaline Phosphatase: 152 U/L — ABNORMAL HIGH (ref 39–117)
BUN: 3 mg/dL — ABNORMAL LOW (ref 6–23)
BUN: 9 mg/dL (ref 6–23)
CO2: 23 mEq/L (ref 19–32)
Calcium: 9.4 mg/dL (ref 8.4–10.5)
Chloride: 104 mEq/L (ref 96–112)
Chloride: 104 mEq/L (ref 96–112)
Creatinine, Ser: 0.67 mg/dL (ref 0.4–1.5)
Creatinine, Ser: 0.95 mg/dL (ref 0.4–1.5)
GFR calc Af Amer: >60 mL/min (ref >60)
GFR calc Af Amer: >60 mL/min (ref >60)
GFR calc non Af Amer: >60 mL/min (ref >60)
Glucose, Bld: 76 mg/dL (ref 70–99)
Potassium: 3.1 mEq/L — ABNORMAL LOW (ref 3.5–5.1)
Potassium: 3.8 mEq/L (ref 3.5–5.1)
Sodium: 139 mEq/L (ref 135–145)
Total Bilirubin: 0.6 mg/dL (ref 0.3–1.2)
Total Bilirubin: 0.8 mg/dL (ref 0.3–1.2)
Total Bilirubin: 1.3 mg/dL — ABNORMAL HIGH (ref 0.3–1.2)
Total Protein: 6 g/dL (ref 6.0–8.3)

## 2010-06-28 LAB — PROTIME-INR
INR: 1.3 (ref 0.00–1.49)
Prothrombin Time: 16.1 seconds — ABNORMAL HIGH (ref 11.6–15.2)

## 2010-06-28 LAB — ETHANOL
Alcohol, Ethyl (B): <5 mg/dL (ref 0–10)
Alcohol, Ethyl (B): <5 mg/dL (ref 0–10)

## 2010-06-28 LAB — TSH: TSH: 0.942 u[IU]/mL (ref 0.350–4.500)

## 2010-06-28 LAB — URINE MICROSCOPIC-ADD ON

## 2010-06-28 LAB — URINE CULTURE
Colony Count: NO GROWTH
Colony Count: >=100000
Culture: NO GROWTH

## 2010-06-28 LAB — POCT CARDIAC MARKERS
CKMB, poc: <1 ng/mL — ABNORMAL LOW (ref 1.0–8.0)
Myoglobin, poc: 187 ng/mL (ref 12–200)
Troponin i, poc: <0.05 ng/mL (ref 0.00–0.09)

## 2010-06-28 LAB — HEPATIC FUNCTION PANEL
ALT: 35 U/L (ref 0–53)
AST: 45 U/L — ABNORMAL HIGH (ref 0–37)
Indirect Bilirubin: 0.8 mg/dL (ref 0.3–0.9)
Total Protein: 7.1 g/dL (ref 6.0–8.3)

## 2010-06-28 LAB — RAPID URINE DRUG SCREEN, HOSP PERFORMED
Cocaine: NOT DETECTED
Opiates: NOT DETECTED

## 2010-07-05 LAB — DIFFERENTIAL
Basophils Absolute: 0 10*3/uL (ref 0.0–0.1)
Basophils Absolute: 0 10*3/uL (ref 0.0–0.1)
Basophils Absolute: 0 10*3/uL (ref 0.0–0.1)
Basophils Absolute: 0.1 10*3/uL (ref 0.0–0.1)
Basophils Absolute: 0 10*3/uL (ref 0.0–0.1)
Basophils Relative: 0 % (ref 0–1)
Basophils Relative: 0 % (ref 0–1)
Basophils Relative: 1 % (ref 0–1)
Eosinophils Absolute: 0.1 10*3/uL (ref 0.0–0.7)
Eosinophils Absolute: 0.2 10*3/uL (ref 0.0–0.7)
Eosinophils Absolute: 0.3 10*3/uL (ref 0.0–0.7)
Eosinophils Relative: 2 % (ref 0–5)
Eosinophils Relative: 2 % (ref 0–5)
Eosinophils Relative: 3 % (ref 0–5)
Eosinophils Relative: 3 % (ref 0–5)
Lymphocytes Relative: 20 % (ref 12–46)
Lymphocytes Relative: 21 % (ref 12–46)
Lymphocytes Relative: 22 % (ref 12–46)
Lymphocytes Relative: 27 % (ref 12–46)
Lymphocytes Relative: 27 % (ref 12–46)
Lymphocytes Relative: 33 % (ref 12–46)
Lymphs Abs: 1.7 10*3/uL (ref 0.7–4.0)
Lymphs Abs: 2.1 10*3/uL (ref 0.7–4.0)
Monocytes Absolute: 0.3 10*3/uL (ref 0.1–1.0)
Monocytes Absolute: 0.5 10*3/uL (ref 0.1–1.0)
Monocytes Absolute: 0.8 10*3/uL (ref 0.1–1.0)
Monocytes Absolute: 0.8 10*3/uL (ref 0.1–1.0)
Monocytes Absolute: 0.9 10*3/uL (ref 0.1–1.0)
Monocytes Absolute: 0.9 10*3/uL (ref 0.1–1.0)
Monocytes Relative: 10 % (ref 3–12)
Monocytes Relative: 4 % (ref 3–12)
Monocytes Relative: 7 % (ref 3–12)
Neutro Abs: 5.6 10*3/uL (ref 1.7–7.7)
Neutro Abs: 5.7 10*3/uL (ref 1.7–7.7)
Neutro Abs: 6.3 10*3/uL (ref 1.7–7.7)
Neutro Abs: 6.6 10*3/uL (ref 1.7–7.7)
Neutrophils Relative %: 73 % (ref 43–77)
Neutrophils Relative %: 74 % (ref 43–77)
Neutrophils Relative %: 52 % (ref 43–77)

## 2010-07-05 LAB — CBC
HCT: 39.9 % (ref 39.0–52.0)
HCT: 40.1 % (ref 39.0–52.0)
HCT: 41.7 % (ref 39.0–52.0)
HCT: 43.1 % (ref 39.0–52.0)
HCT: 40.8 % (ref 39.0–52.0)
Hemoglobin: 13.5 g/dL (ref 13.0–17.0)
Hemoglobin: 16.5 g/dL (ref 13.0–17.0)
Hemoglobin: 13.8 g/dL (ref 13.0–17.0)
MCHC: 33.3 g/dL (ref 30.0–36.0)
MCHC: 33.9 g/dL (ref 30.0–36.0)
MCV: 93.7 fL (ref 78.0–100.0)
MCV: 94.2 fL (ref 78.0–100.0)
MCV: 94.4 fL (ref 78.0–100.0)
MCV: 95.3 fL (ref 78.0–100.0)
MCV: 96 fL (ref 78.0–100.0)
Platelets: 94 10*3/uL — ABNORMAL LOW (ref 150–400)
Platelets: 97 10*3/uL — ABNORMAL LOW (ref 150–400)
RBC: 4.18 MIL/uL — ABNORMAL LOW (ref 4.22–5.81)
RBC: 4.42 MIL/uL (ref 4.22–5.81)
RBC: 4.51 MIL/uL (ref 4.22–5.81)
RBC: 4.58 MIL/uL (ref 4.22–5.81)
RBC: 5.23 MIL/uL (ref 4.22–5.81)
RBC: 4.25 MIL/uL (ref 4.22–5.81)
RDW: 15.5 % (ref 11.5–15.5)
RDW: 15.8 % — ABNORMAL HIGH (ref 11.5–15.5)
WBC: 7.9 10*3/uL (ref 4.0–10.5)
WBC: 8.1 10*3/uL (ref 4.0–10.5)
WBC: 9.1 10*3/uL (ref 4.0–10.5)
WBC: 8.1 10*3/uL (ref 4.0–10.5)

## 2010-07-05 LAB — GLUCOSE, CAPILLARY
Glucose-Capillary: 121 mg/dL — ABNORMAL HIGH (ref 70–99)
Glucose-Capillary: 121 mg/dL — ABNORMAL HIGH (ref 70–99)
Glucose-Capillary: 133 mg/dL — ABNORMAL HIGH (ref 70–99)
Glucose-Capillary: 144 mg/dL — ABNORMAL HIGH (ref 70–99)
Glucose-Capillary: 145 mg/dL — ABNORMAL HIGH (ref 70–99)
Glucose-Capillary: 220 mg/dL — ABNORMAL HIGH (ref 70–99)
Glucose-Capillary: 86 mg/dL (ref 70–99)
Glucose-Capillary: 114 mg/dL — ABNORMAL HIGH (ref 70–99)
Glucose-Capillary: 130 mg/dL — ABNORMAL HIGH (ref 70–99)
Glucose-Capillary: 130 mg/dL — ABNORMAL HIGH (ref 70–99)
Glucose-Capillary: 132 mg/dL — ABNORMAL HIGH (ref 70–99)
Glucose-Capillary: 146 mg/dL — ABNORMAL HIGH (ref 70–99)
Glucose-Capillary: 153 mg/dL — ABNORMAL HIGH (ref 70–99)
Glucose-Capillary: 173 mg/dL — ABNORMAL HIGH (ref 70–99)

## 2010-07-05 LAB — HEPATITIS PANEL, ACUTE
Hep A IgM: NEGATIVE
Hep B C IgM: NEGATIVE

## 2010-07-05 LAB — URINE CULTURE

## 2010-07-05 LAB — BASIC METABOLIC PANEL
BUN: 1 mg/dL — ABNORMAL LOW (ref 6–23)
CO2: 25 mEq/L (ref 19–32)
CO2: 26 mEq/L (ref 19–32)
CO2: 31 mEq/L (ref 19–32)
Calcium: 8.5 mg/dL (ref 8.4–10.5)
Calcium: 8.6 mg/dL (ref 8.4–10.5)
Calcium: 9.2 mg/dL (ref 8.4–10.5)
Calcium: 8.5 mg/dL (ref 8.4–10.5)
Calcium: 9.4 mg/dL (ref 8.4–10.5)
Chloride: 97 mEq/L (ref 96–112)
Creatinine, Ser: 0.85 mg/dL (ref 0.4–1.5)
Creatinine, Ser: 0.67 mg/dL (ref 0.4–1.5)
GFR calc Af Amer: >60 mL/min (ref >60)
GFR calc Af Amer: >60 mL/min (ref >60)
GFR calc Af Amer: >60 mL/min (ref >60)
GFR calc Af Amer: >60 mL/min (ref >60)
GFR calc non Af Amer: >60 mL/min (ref >60)
GFR calc non Af Amer: >60 mL/min (ref >60)
GFR calc non Af Amer: >60 mL/min (ref >60)
Glucose, Bld: 116 mg/dL — ABNORMAL HIGH (ref 70–99)
Glucose, Bld: 150 mg/dL — ABNORMAL HIGH (ref 70–99)
Glucose, Bld: 103 mg/dL — ABNORMAL HIGH (ref 70–99)
Potassium: 4.1 mEq/L (ref 3.5–5.1)
Sodium: 129 mEq/L — ABNORMAL LOW (ref 135–145)
Sodium: 132 mEq/L — ABNORMAL LOW (ref 135–145)
Sodium: 132 mEq/L — ABNORMAL LOW (ref 135–145)

## 2010-07-05 LAB — COMPREHENSIVE METABOLIC PANEL
AST: 51 U/L — ABNORMAL HIGH (ref 0–37)
AST: 59 U/L — ABNORMAL HIGH (ref 0–37)
AST: 52 U/L — ABNORMAL HIGH (ref 0–37)
Albumin: 2.6 g/dL — ABNORMAL LOW (ref 3.5–5.2)
Albumin: 2.7 g/dL — ABNORMAL LOW (ref 3.5–5.2)
Albumin: 2.7 g/dL — ABNORMAL LOW (ref 3.5–5.2)
Alkaline Phosphatase: 116 U/L (ref 39–117)
Alkaline Phosphatase: 136 U/L — ABNORMAL HIGH (ref 39–117)
BUN: 3 mg/dL — ABNORMAL LOW (ref 6–23)
BUN: 6 mg/dL (ref 6–23)
CO2: 27 mEq/L (ref 19–32)
CO2: 27 mEq/L (ref 19–32)
CO2: 26 mEq/L (ref 19–32)
Calcium: 8.1 mg/dL — ABNORMAL LOW (ref 8.4–10.5)
Chloride: 100 mEq/L (ref 96–112)
Chloride: 96 mEq/L (ref 96–112)
Chloride: 103 mEq/L (ref 96–112)
Creatinine, Ser: 0.73 mg/dL (ref 0.4–1.5)
Creatinine, Ser: 0.79 mg/dL (ref 0.4–1.5)
Creatinine, Ser: 0.89 mg/dL (ref 0.4–1.5)
Creatinine, Ser: 0.81 mg/dL (ref 0.4–1.5)
GFR calc Af Amer: >60 mL/min (ref >60)
GFR calc Af Amer: >60 mL/min (ref >60)
GFR calc non Af Amer: >60 mL/min (ref >60)
GFR calc non Af Amer: >60 mL/min (ref >60)
GFR calc non Af Amer: >60 mL/min (ref >60)
Glucose, Bld: 119 mg/dL — ABNORMAL HIGH (ref 70–99)
Potassium: 3.7 mEq/L (ref 3.5–5.1)
Potassium: 4.1 mEq/L (ref 3.5–5.1)
Total Bilirubin: 2 mg/dL — ABNORMAL HIGH (ref 0.3–1.2)
Total Bilirubin: 2.2 mg/dL — ABNORMAL HIGH (ref 0.3–1.2)
Total Bilirubin: 2.4 mg/dL — ABNORMAL HIGH (ref 0.3–1.2)
Total Bilirubin: 1.3 mg/dL — ABNORMAL HIGH (ref 0.3–1.2)
Total Protein: 5.9 g/dL — ABNORMAL LOW (ref 6.0–8.3)

## 2010-07-05 LAB — URINALYSIS, ROUTINE W REFLEX MICROSCOPIC
Bilirubin Urine: NEGATIVE
Leukocytes, UA: NEGATIVE
Leukocytes, UA: NEGATIVE
Nitrite: NEGATIVE
Specific Gravity, Urine: 1.01 (ref 1.005–1.030)
Urobilinogen, UA: 2 mg/dL — ABNORMAL HIGH (ref 0.0–1.0)
Urobilinogen, UA: >8 mg/dL — ABNORMAL HIGH (ref 0.0–1.0)
pH: 7 (ref 5.0–8.0)

## 2010-07-05 LAB — URINE MICROSCOPIC-ADD ON

## 2010-07-05 LAB — MAGNESIUM
Magnesium: 1.2 mg/dL — ABNORMAL LOW (ref 1.5–2.5)
Magnesium: 1.4 mg/dL — ABNORMAL LOW (ref 1.5–2.5)

## 2010-07-05 LAB — CULTURE, BLOOD (ROUTINE X 2)
Culture: NO GROWTH
Report Status: 2182010

## 2010-07-05 LAB — HEPATIC FUNCTION PANEL
ALT: 24 U/L (ref 0–53)
AST: 69 U/L — ABNORMAL HIGH (ref 0–37)
AST: 65 U/L — ABNORMAL HIGH (ref 0–37)
Albumin: 2.7 g/dL — ABNORMAL LOW (ref 3.5–5.2)
Alkaline Phosphatase: 164 U/L — ABNORMAL HIGH (ref 39–117)
Bilirubin, Direct: 1 mg/dL — ABNORMAL HIGH (ref 0.0–0.3)
Bilirubin, Direct: 1.1 mg/dL — ABNORMAL HIGH (ref 0.0–0.3)
Bilirubin, Direct: 0.4 mg/dL — ABNORMAL HIGH (ref 0.0–0.3)
Indirect Bilirubin: 0.8 mg/dL (ref 0.3–0.9)
Total Bilirubin: 1.8 mg/dL — ABNORMAL HIGH (ref 0.3–1.2)
Total Bilirubin: 2.4 mg/dL — ABNORMAL HIGH (ref 0.3–1.2)
Total Bilirubin: 1 mg/dL (ref 0.3–1.2)

## 2010-07-05 LAB — AMMONIA
Ammonia: 19 umol/L (ref 11–35)
Ammonia: 50 umol/L — ABNORMAL HIGH (ref 11–35)

## 2010-07-05 LAB — VITAMIN B12: Vitamin B-12: 773 pg/mL (ref 211–911)

## 2010-07-05 LAB — HEMOGLOBIN A1C: Hgb A1c MFr Bld: 6.7 % — ABNORMAL HIGH (ref 4.6–6.1)

## 2010-07-05 LAB — RAPID URINE DRUG SCREEN, HOSP PERFORMED
Amphetamines: NOT DETECTED
Benzodiazepines: NOT DETECTED
Tetrahydrocannabinol: NOT DETECTED

## 2010-07-05 LAB — ETHANOL: Alcohol, Ethyl (B): <5 mg/dL (ref 0–10)

## 2010-07-05 LAB — LACTIC ACID, PLASMA: Lactic Acid, Venous: 1.6 mmol/L (ref 0.5–2.2)

## 2010-07-05 LAB — FOLATE: Folate: 9 ng/mL

## 2010-07-05 LAB — KETONES, QUALITATIVE: Acetone, Bld: NEGATIVE

## 2010-07-05 LAB — LIPASE, BLOOD: Lipase: 13 U/L (ref 11–59)

## 2010-07-05 LAB — TSH: TSH: 1.443 u[IU]/mL (ref 0.350–4.500)
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
## 2010-08-02 NOTE — Consult Note (Signed)
NAME:  [name], [name] NO.:  [id number]   MEDICAL RECORD NO.:  [id number]          PATIENT TYPE:  INP   LOCATION:  A323                          FACILITY:  [facility]   PHYSICIAN:  Barbaraann Barthel, M.D. DATE OF BIRTH:  31-May-1950   DATE OF CONSULTATION:  04/30/2008  DATE OF DISCHARGE:                                 CONSULTATION   Note, Surgery was asked to see this 60 year old black male who was  brought in from the emergency room to the Medical Service with history  of alcohol withdrawals and seizures secondary to the same, and while he  was here in the hospital undergoing detoxification for his ethanol  abuse, he was noted to have a rather large right inguinal hernia, so  Surgery was consulted.   The patient's chart has been reviewed, and the patient has been  examined.  He is resting comfortably.  His vital signs are stable.  He  has, in essence, a large right inguinal hernia, which is pliable and  reducible with effort.  This should be taken care, but we should wait  until this patient has completed his detox therapy in order to do this,  so that he has less chance of straining and less chance of recurrence.  The timing of the surgery depends upon when the Medical Service can have  him under control in a realistic manner, so that he can undergo surgery  and then perioperative recovery.  He needs a good 2 months  postoperatively to allow this hernia to heal without putting undue  strain on this.  I do not know what this patient's living conditions are  that would allow [place] to be able to do this in the hospital setting, but  obviously at present with undergoing detoxification, he is not a  candidate for surgery at this time.  I will coordinate these efforts or  if the Medical Service has any input on this, I will be glad to  coordinate my surgical efforts with them.      Barbaraann Barthel, M.D.  Electronically Signed     WB/MEDQ  D:  04/30/2008  T:   05/01/2008  Job:  [id number]

## 2010-08-02 NOTE — Discharge Summary (Signed)
NAME:  [name], [name]               ACCOUNT NO.:  [id number]   MEDICAL RECORD NO.:  [id number]          PATIENT TYPE:  INP   LOCATION:  A316                          FACILITY:  [facility]   PHYSICIAN:  Renee Ramus, MD       DATE OF BIRTH:  08-30-1950   DATE OF ADMISSION:  08/13/2008  DATE OF DISCHARGE:  LH                               DISCHARGE SUMMARY   PRIMARY CARE PHYSICIAN:  Dr. Jorene Guest.   PRIMARY DISCHARGE DIAGNOSIS:  Alcohol withdrawal seizures.   SECONDARY DIAGNOSES:  1. Alcohol abuse.  2. Tobacco abuse.  3. Diabetes type 2, poorly controlled.  4. Conjunctivitis.   HOSPITAL COURSE:  By problem:  1. Alcohol withdrawal seizures.  The patient is a 60 year old male who      was admitted secondary to seizure activity.  The patient was seen      in the emergency department and admitted to the Intensive Care      unit.  He was seen in consultation with Neurology.  Their      interpretation of his symptoms and his workup included alcohol      withdrawal seizures and not epilepsy.  The patient will not be      continued on anti-seizure medication postdischarge.  The patient      has been counseled with respect to alcohol abuse.  He does not seem      willing to quit drinking at this time.  2. Tobacco abuse.  The patient has had a nicotine patch on while in      the hospital.  He has been counseled as respect to tobacco abuse.      He does not seem willing to quit at this time.  3. Conjunctivitis.  The patient will continue antibiotics post      discharge and follow up with his primary care physician.  4. Diabetes mellitus type 2.  The patient had a grossly elevated      hemoglobin A1c at 17.  He is on metformin and glipizide and these      will be continued postdischarge.  Given his noncompliance and his      reticence to be compliant, I do not believe that putting him on      insulin at this time would be effective for him and I will defer      this to his primary care physician if  he chooses to follow up with      him for this.   LABS OF NOTE:  1. Hemoglobin A1c of 17.5.  2. Initial leukocytosis white count of 11.3 decreasing to 7.7 on day      of discharge.  3. Blood glucose ranging from 302-152.  4. Alcohol level upon admission was less than 5.  5. Cholesterol panel showing total cholesterol of 145 with      triglycerides of 56, HDL of 49, LDL of 85.  6. TSH of 0.94.  7. Negative troponin and CKs times 7.   STUDIES:  1. 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  2. Chest film shows interstitial densities which are chronic, but no      acute changes.   MEDICATIONS ON DISCHARGE:  1. Glipizide 2.5 mg p.o. b.i.d.  2. Metformin 500 mg p.o. b.i.d.  3. Neomycin/polymyxin ophthalmic solution 2 drops left eye daily times      5 days.   DISCHARGE DISPOSITION:  Of note, the patient was offered placement.  We  believe that this is in his best interest.  He has refused this  adamantly.  He has refused physical therapy in a subacute inpatient  rehab setting.  He wishes to go home.  He will be discharged with home  health, home physical therapy  and social work.  He is currently stable  and ready for discharge.   Time spent on discharge 35 minutes.      Renee Ramus, MD  Electronically Signed     JF/MEDQ  D:  08/20/2008  T:  08/20/2008  Job:  [id number]   cc:   Dr. Jorene Guest

## 2010-08-02 NOTE — Procedures (Signed)
NAME:  [name], [name]               ACCOUNT NO.:  [id number]   MEDICAL RECORD NO.:  [id number]          PATIENT TYPE:  INP   LOCATION:  A323                          FACILITY:  [facility]   PHYSICIAN:  Kofi A. Gerilyn Pilgrim, M.D. DATE OF BIRTH:  27-Nov-1950   DATE OF PROCEDURE:  DATE OF DISCHARGE:                              EEG INTERPRETATION   EEG Interpretation   HISTORY:  This is a 60 year old male who presents with confusion,  altered mental status, so he is being evaluated for possible  nonconvulsive seizures.   MEDICATIONS:  Ativan, vitamin B1, Dilantin, Zofran, Reglan, and  Protonix.   ANALYSIS:  A 16-channel recording is conducted for approximately 20  minutes.  There is a low-voltage electrocortical activity of 16 Hz.  Photic stimulation is carried out without significant changes in the  background activity.  There is no focal or lateralized slowing.  Some  sleep architecture is observed with sleep spindles.  There is no focal  or lateralized slowing.  There is no epileptiform activity.   IMPRESSION:  Normal recording.      Kofi A. Gerilyn Pilgrim, M.D.  Electronically Signed     KAD/MEDQ  D:  05/04/2008  T:  05/04/2008  Job:  [id number]

## 2010-08-02 NOTE — Group Therapy Note (Signed)
NAME:  [name], [name]               ACCOUNT NO.:  [id number]   MEDICAL RECORD NO.:  [id number]          PATIENT TYPE:  INP   LOCATION:  A323                          FACILITY:  [facility]   PHYSICIAN:  Kofi A. Gerilyn Pilgrim, M.D. DATE OF BIRTH:  Jun 22, 1950   DATE OF PROCEDURE:  DATE OF DISCHARGE:                                 PROGRESS NOTE   Temperature 97.1, pulse 97, blood pressure 138/89, respirations 18.  The  patient does not complain of anything today, but he continues to be  quite disoriented, he thinks he is at home.  He cannot give a good  history as far as medical background.  On examination today, he is noted  to have clear left facial weakness involving the left upper and lower  face muscles.  This was probably there yesterday.  He is also noted to  have a low lying atretic left ear.  The patient's history is unreliable.  He does not report any new facial weakness.  Given the left ear problem,  one wonders if this is a chronic process.  The right ear is normal.  He  does have significant purulent looking material into the left ear.   ASSESSMENT:  1. Withdrawal seizures due to alcohol withdrawal.  We went ahead and      discontinued the seizure medication.  2. 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  3. Evidence of otitis externa.  We will start the patient on Cipro      Otic solution.  4. The patient has an EEG pending.  Will continue to follow this up.      Kofi A. Gerilyn Pilgrim, M.D.  Electronically Signed     KAD/MEDQ  D:  05/05/2008  T:  05/05/2008  Job:  [id number]

## 2010-08-02 NOTE — H&P (Signed)
NAME:  [name], WINKLEMAN NO.:  [id number]   MEDICAL RECORD NO.:  [id number]          PATIENT TYPE:  INP   LOCATION:  IC09                          FACILITY:  [facility]   PHYSICIAN:  Dorris Singh, DO    DATE OF BIRTH:  [birth year]   DATE OF ADMISSION:  08/13/2008  DATE OF DISCHARGE:  LH                              HISTORY & PHYSICAL   CHIEF COMPLAINT/HISTORY OF PRESENT ILLNESS:  The patient is a 60-year-  old African American male who presented to the [facility] Emergency Room  with a chief complaint of seizure.  He was brought in via EMS.  He was  transported from home where he lives with his son.  Son said he had a  seizure followed by confusion, and then when EMS arrived, he had a  seizure that was witnessed there and also had a seizure in the ED.   PRIMARY CARE PHYSICIAN:  Dr. Jorene Guest.   PAST MEDICAL HISTORY:  Alcohol abuse, head injury, seizure disorder.   SOCIAL HISTORY:  Lives with family.  No drug abuse.  He is a current  smoker, heavy drinker.   ALLERGIES:  No known drug allergies.   REVIEW OF SYSTEMS:  Unable to assess due to a level 5 caveat.   PHYSICAL EXAM:  CURRENT VITAL SIGNS:  Reviewed.  GENERAL:  The patient is a thin African American male who looks older  than his stated age.  He is ill-appearing and lethargic, and with poor  hygiene.  He is dirty and has dirt or feces over his toes and toenails.  HEENT:  Normocephalic, atraumatic.  Eyes, his left eye is open and red  and appears to have had some trauma to it.  Ears, nose, mouth, and  throat as mentioned before, are malodorous.  His teeth are in poor  repair.  NECK:  Supple.  HEART:  Regular rate and rhythm.  LUNGS:  Clear to auscultation bilaterally.  ABDOMEN:  Soft, nontender, nondistended.  He does have a large, easily  reducible right inguinal hernia.  EXTREMITIES:  He does have discoloration of his toes as well.   LABORATORY STUDIES:  ABG shows blood gas 7.2, PCO2 45, oxygen 98.4,  blood sugar 186, sodium 137, potassium 3.6, CO2 23, glucose 368, which  came down originally, and BUN 9 and creatinine 0.95.  His drug screen is  negative.  His troponin is negative.   ASSESSMENT AND PLAN:  1. Seizure disorder.  2. History of alcohol abuse.  3. Cachectic appearance.   PLAN:  Will be to admit the patient to the ICU since he is lethargic.  Will also get Dr. Gerilyn Pilgrim to see him and order an EEG.  We will do DVT  and GI prophylaxis.  Currently we will make him n.p.o.  Also, will get a  CBC and an ABG on him to see if there are any other changes that we need  to consider.  Will do sliding scale on him as well.  Will continue to  monitor him and change therapy as necessary.      Dorris Singh, DO  Electronically Signed     CB/MEDQ  D:  08/13/2008  T:  08/13/2008  Job:  [id number]

## 2010-08-02 NOTE — Group Therapy Note (Signed)
NAME:  [name], [name]               ACCOUNT NO.:  [id number]   MEDICAL RECORD NO.:  [id number]          PATIENT TYPE:  INP   LOCATION:  A323                          FACILITY:  [facility]   PHYSICIAN:  Osvaldo Shipper, MD     DATE OF BIRTH:  10-18-1950   DATE OF PROCEDURE:  05/04/2008  DATE OF DISCHARGE:                                 PROGRESS NOTE   SUBJECTIVE:  The patient is awake, alert, but again very difficult to  understand what he is trying to say.  According to the nurse, he was  agitated all night and pulled his IV out.  Denies any pain at this time.   OBJECTIVE:  VITAL SIGNS:  Temperature 97.3, heart rate 103, respiratory  rate 18, blood pressure was 154/94 this morning, saturation 95% on room  air.  GENERAL EXAM:  Disheveled, black male in no distress, seems slightly  agitated.  HEENT:  There is no pallor, no icterus.  Oral mucous membranes is moist.  No lesions are noted.  NECK:  Soft and supple.  No thyromegaly is appreciated.  LUNGS:  Reveal a few rhonchi bilaterally, mostly clear to auscultation.  CARDIOVASCULAR SYSTEM:  S1-S2 normal regular.  ABDOMEN:  Soft, nontender, nondistended.  Bowel sounds are present.  No  masses or organomegaly is appreciated.  EXTREMITIES:  Show no edema.  NEUROLOGICAL:  I do not appreciate any focal deficits.   LABORATORY DATA:  His CBC reveals a stable WBC and hemoglobin; platelet  count is 118 which is improved.  His BMET is still pending from this  morning.  Ammonia level is 32.  Blood cultures have not grown anything.   ASSESSMENT AND PLAN:  1. Fever resolved.  He is on ceftriaxone empirically.  This is I      believe day 3.  Blood cultures have been negative so far.  2. Alcohol withdrawal.  The patient taking a long time to recover.  He      is on the Ativan protocol.  I will prescribe some Haldol for      agitation.  He is on thiamine as well.  3. Altered mental status.  I think it is still because of his      alcoholism.  His CT  head showed atrophy with nothing acute.  I am      going to go ahead and consult Dr. Gerilyn Pilgrim to see if there could be      some other reason for his alteration in mental status.  4. Thrombocytopenia, stable.  5. Sinus tachycardia, improved.  6. Mild hyponatremia.  His sodium levels yesterday had improved to      133.  The levels are pending this morning.  7. Hypokalemia was repeated.  8. Right-sided inguinal hernia.  Outpatient follow-up with surgery.  9. Abnormal LFTs with liver cirrhosis.  GI consult is pending at this      time.  LFTs are all stable as of yesterday.  Ammonia level is down      to normal.  He is on lactulose.  10.Slightly high blood pressure.  Will continue to  monitor this.  This      could be a result of his agitation.  Hopefully, with control of his      agitation, his blood pressure should improve.   His diet will be advanced today.  He will be monitored closely, and  maybe he will be moved out of bed to chair.  Then, hopefully the Haldol  should help calm him down a little bit.   His home situation will have to be sorted out by social worker before  discharge planning can be initiated.  In any case, the patient at this  time is not medically ready for discharge.      Osvaldo Shipper, MD  Electronically Signed     GK/MEDQ  D:  05/04/2008  T:  05/04/2008  Job:  [id number]

## 2010-08-02 NOTE — Consult Note (Signed)
NAME:  MURRELL, DOME               ACCOUNT NO.:  [id number]   MEDICAL RECORD NO.:  [id number]          PATIENT TYPE:  INP   LOCATION:  A323                          FACILITY:  [facility]   PHYSICIAN:  R. Roetta Sessions, M.D. DATE OF BIRTH:  October 12, 1950   DATE OF CONSULTATION:  DATE OF DISCHARGE:                                 CONSULTATION   REASON FOR CONSULTATION:  Abnormal LFTs.   HISTORY OF PRESENT ILLNESS:  Mr. Fontaine is a 60 year old African  American male.  He was admitted to [facility] on April 29, 2008 with seizures and alcohol withdrawal.  He tells me he has not had  anything to drink since 1 week ago.  Since hospitalization he has been  put on alcohol withdrawal protocol.  They have been having to give him  Haldol as well.  He was found to have a rise in his LFTs.  His total  bilirubin was 2, it is currently 1.8.  His alkaline phosphatase was 116,  it is now 164.  His AST was 51, now 69.  His ALT was 16, now 24.  He had  abdominal ultrasound on May 01, 2008.  He was found to have  cholelithiasis and 6 mm borderline large common bile duct and cirrhosis.  He was also diagnosed with a UTI.  He consumes about 1-1/2 gallons of  liquor or wine per day and has done this for years.  He denies any  nausea, vomiting or abdominal pain.  He is complaining of bilateral low  back pain.  He denies any rectal bleeding or melena.  Denies any  constipation or diarrhea.  He was about evaluated by Dr. Malvin Johns on  April 30, 2008 for a large right inguinal hernia.  It was felt that  given his unstable condition and extensive postop recovery, surgery  would need to be postponed until he was completely detoxified and in a  more stable condition.   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   PAST SURGICAL HISTORY:  Denies any, although does have a left artificial  eye.   MEDICATIONS PRIOR TO  ADMISSION:  He denies any.   ALLERGIES:  No known drug allergies.   FAMILY HISTORY:  There is no known family history of __________  carcinoma or chronic GI problems.   SOCIAL HISTORY:  Mr. Kumari has significant alcohol abuse.  He tells me  he lives on the streets, although a cousin, Trevonne Nyland, phone  number [phone], reports that he lives with his sister and brother.  He  tells me he has been a smoker for many years, unable to give me any  further details.  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  He gives remote history of crack use.  He is single, tells  me he has never been married.   REVIEW OF SYSTEMS:  See HPI, otherwise negative.   PHYSICAL EXAMINATION:  VITAL SIGNS:  Temperature 98.8, pulse 108,  respirations 20, blood pressure 153/92 and  O2 sat 95% on room air.  GENERAL:  Mr. Orrego is a disheveled appearing African American male who  is alert.  He is alert.  He is in no acute distress.  HEENT:  Sclera clear on the right.  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  Left eye  with the erythematous injection.  Oropharynx pink and moist.  Poor  dentition.  NECK:  Supple without any mass or thyromegaly.  CHEST:  Heart regular rate and rhythm with normal S1, S2 without  murmurs, clicks, rubs or gallops.  LUNGS:  Clear to auscultation bilaterally.  ABDOMEN:  Positive bowel sounds x4.  No bruits auscultated.  Soft,  nontender, nondistended without palpable hepatosplenomegaly.  He does  have a large bulging right inguinal hernia which is not easily  reducible, nontender.  EXTREMITIES:  With clubbing.  No edema.  NEUROLOGIC:  He is restless.  He has somewhat slurred speech and is  difficult to understand.   LABORATORY STUDIES:  LFTs as described in HPI.  Blood cultures were  negative for 2 days.  Magnesium 1.5.  Sodium 132, potassium 4.1,  chloride 101, CO2 28, glucose is 116, BUN is 2, creatinine is 0.75,  calcium is 8.6, ammonia 32.  White blood cell count  9.6, hemoglobin  13.5, hematocrit 39.9, platelet count 118.  Lipase 13.  Dilantin level  8.3.  Hemoglobin A1c 6.7.  Folate 9, B12 of 773.  TSH normal.  Lactic  acid 1.6.  Alcohol level less than 5 on April 29, 2008.   IMPRESSION:  Mr. Jeanpaul Biehl is a 60 year old African American male  with chronic alcohol abuse, seizure disorder, and recent diagnosis of  cirrhosis.  He has been on Haldol on delirium tremens prophylaxis.  Liver function test have climbs since admission by remain less than  twice normal.  I suspect elevation secondary to alcohol abuse, chronic  liver disease cirrhosis and medication effect.  He has known  cholelithiasis without typical biliary pain.  He has a large right  inguinal hernia.   PLAN:  1. Alcohol cessation urged.  2. Follow up hepatitis panels.  3. AFP and LFTs in a.m.  4. Agree with withdrawal protocol.  5. Further recommendations to follow.   We would like to thank [facility] for allowing [place] to participate  in the care of Mr. Uhrich.      Lorenza Burton, N.P.      Jonathon Bellows, M.D.  Electronically Signed    KJ/MEDQ  D:  05/04/2008  T:  05/04/2008  Job:  [id number]

## 2010-08-02 NOTE — Discharge Summary (Signed)
NAMEJAZIEL, [name]               ACCOUNT NO.:  [id number]   MEDICAL RECORD NO.:  [id number]          PATIENT TYPE:  INP   LOCATION:  A323                          FACILITY:  [facility]   PHYSICIAN:  Skeet Latch, DO    DATE OF BIRTH:  15-Jul-1950   DATE OF ADMISSION:  04/29/2008  DATE OF DISCHARGE:  02/18/2010LH                               DISCHARGE SUMMARY   DISCHARGE DIAGNOSES:  1. Alcohol withdrawal seizures.  2. History of alcohol abuse.  3. Mental status changes, resolved.  4. History of seizures.  5. History of hypertension.   BRIEF HOSPITAL COURSE:  This is a 60 year old African American male who  presented to the emergency room.  The patient's chief complaint was  seizures.  Apparently, the patient stopped taking his Dilantin 3 months  prior.  The patient has a history of seizure disorder presumably because  of his alcohol abuse.  The patient states that on Sunday prior to being  admitted he stopped drinking, states he drinks a lot of low liquor and  beer, but was unable to quantify how much that he drinks.  The patient  presented with some mental status changes, was unable to communicate  effectively upon his admission.  Apparently, the patient had one episode  of seizure on the day of admission.  The patient presented with above  symptoms.  Initial labs showed CBC he had a platelet count of 93,000,  sodium 129, potassium 4, chloride 89, bicarb 26, glucose 214, BUN 5,  creatinine 0.8, magnesium was 1.2.  Phenytoin level was less than 2.5.  His urine drug screen was negative.  Alcohol was less than +5.  CT of  his head did not show any acute findings.  He was admitted with alcohol  withdrawal seizures.  He was loaded with Dilantin.  His magnesium was  replenished and rechecked.  The patient was placed on Ativan withdrawal  protocol.  His thrombocytopenia was thought secondary to his alcohol  abuse.  The patient was placed on DVT as well as GI prophylaxis.  Social  work was  consulted regarding his home situation.  The patient was found  on exam to have a large inguinal hernia on the right side that was not  easily reducible.  The patient did not seem to be tender.  The patient  was subsequently switched from IV Ativan protocol to p.o.  The patient  was placed on thiamine and multivitamins on a daily basis.  The patient  also placed on antibiotics.  He was also started on lactulose twice  daily.  The patient seems to be stable at this time.  We feel the  patient can be discharged to home.  I had a conversation with General  Surgery regarding fixing his inguinal hernia prior to it becoming an  emergency basis.  General Surgery, Dr. Malvin Johns, saw the patient and  instructed the patient on severity of his hernia and that he probably  needs this fixed sooner rather than later.  The patient states that he  does not want his hernia repaired and has no interest in that  at this  time.  The patient was told to, since he does not have a physician, that  he either needs to [facility] or go to the  emergency room if he has problems with withdrawal from his alcohol abuse  or problems with his hernia.  Again, family is aware of the patient's  history with his alcohol abuse and the severity of his hernia and he was  advised to not get his hernia repaired at this time.   Medications on discharge include:  1. Lactulose 30 mL twice a day.  2. Multivitamin 1 tab daily.  3. Thiamine 100 mg daily.  4. Vantin 200 mg twice a day for 5 days.  5. Magnesium oxide 400 mg twice a day.   RADIOLOGIC STUDIES:  The patient did have a swallow study, and it was  recommended that the patient be on dysphagia III diet with mechanical  softs with thin liquids and his pills to be taken whole one at a time in  puree or crushes.   VITALS ON DISCHARGE:  Temperature is 98.8, pulse 88, respirations 18,  blood pressure 110/71, he is sating 94% on room air.    LABORATORY DATA:  Sodium 135, potassium 3.8, chloride 96, CO2 is 31,  glucose 103, BUN of 1, creatinine 0.67.  Total bilirubin 1.0, directed  bilirubin 0.4.  His alkaline phosphatase 221, AST 65, ALT 33, total  protein 7.8, albumin is 3.2.   CONDITION ON DISCHARGE:  Stable.   DISPOSITION:  The patient will be discharged to home with family.   DISCHARGE INSTRUCTIONS:  The patient is to maintain a regular diet.  The  patient is to follow up with Health since he does not have a primary  care physician at this time.  He is to increase his activity slowly.  The patient is to take his medications as directed.  The patient is to  stop all alcohol of any kind.  Again, the patient was instructed that he  needed his hernia repaired and the patient at this time does not wish to  have that done.  The patient aware of risks at this time.      Skeet Latch, DO  Electronically Signed     SM/MEDQ  D:  05/07/2008  T:  05/08/2008  Job:  [id number]

## 2010-08-02 NOTE — Group Therapy Note (Signed)
[name], [name]               ACCOUNT NO.:  [id number]   MEDICAL RECORD NO.:  [id number]          PATIENT TYPE:  INP   LOCATION:  A323                          FACILITY:  [facility]   PHYSICIAN:  Osvaldo Shipper, MD     DATE OF BIRTH:  May 10, 1950   DATE OF PROCEDURE:  05/01/2008  DATE OF DISCHARGE:                                 PROGRESS NOTE   SUBJECTIVE:  The patient is asleep.  He is sedated with Ativan and when  I try to wake him up he does not communicate much.   OBJECTIVE:  VITAL SIGNS:  His vital signs show that his temperature is  97.4 overnight, heart rate 104, respiratory rate 20, blood pressure  131/81, saturation 92% on room air.  He has been running low-grade  fevers, 100.2, 99.8 overnight and yesterday.  GENERAL:  A disheveled black male in no distress.  HEENT:  There is no pallor, no icterus.  LUNGS:  Clear to auscultation anteriorly bilaterally.  CARDIOVASCULAR:  S1, S2 normal, regular.  No murmurs appreciated.  ABDOMEN:  Soft, nontender, nondistended.  EXTREMITIES:  Show no edema.  NEUROLOGICAL:  He is somnolent.  No focal neurological deficits are  present.   LABS:  CBC is pretty stable except for a low platelet count which is  also stable.  His sodium is improved to 134.  His BUN and creatinine are  normal.  His bilirubin is 2.4, alkaline phosphatase is 136, AST 59, ALT  40.  His ammonia level is 31 this morning.   ASSESSMENT AND PLAN:  1. Alcohol withdrawal.  This appears to be stable at this time.  He is      on the Ativan IV protocol.  He is on thiamine.  2. Alcohol withdrawal seizure.  He is on Cerebyx t.i.d. for now until      he is able to take orally.  3. Thrombocytopenia.  Stable.  Continue to monitor.  4. Mild hyponatremia.  Improving.  5. Hypomagnesemia was corrected.  We will recheck tomorrow.  6. Hyperglycemia.  Hb1c is pending.  His blood sugars have been okay      except for the time of admission.  7. Right-sided inguinal hernia.  Appreciate  Dr. Daisy Blossom input.  No      need for urgent surgery.  8. Abnormal LFTs.  This requires an ultrasound of the abdomen which      hopefully will be done later today.  He is      on proton pump inhibitor.  He is on DVT prophylaxis with sequential      compressive devices.  If not he will need to start this.   So the patient is still going through DTs and we still have a few days  of hospital stay left in this individual.      Osvaldo Shipper, MD  Electronically Signed     GK/MEDQ  D:  05/01/2008  T:  05/01/2008  Job:  [id number]

## 2010-08-02 NOTE — Consult Note (Signed)
NAME:  [name], [name]               ACCOUNT NO.:  [id number]   MEDICAL RECORD NO.:  [id number]          PATIENT TYPE:  INP   LOCATION:  IC09                          FACILITY:  [facility]   PHYSICIAN:  Kofi A. Gerilyn Pilgrim, M.D. DATE OF BIRTH:  12/26/1950   DATE OF CONSULTATION:  DATE OF DISCHARGE:                                 CONSULTATION   REASON FOR CONSULTATION:  Seizures and altered mentation.   HISTORY OF PRESENT ILLNESS:  This is a 60 year old black male who has a  baseline history of alcohol withdrawal seizures.  The patient was last  seen here in February 2010, with an essentially similar presentation,  having seizures after the cessation of alcohol intake.  At that time,  the patient did have an EEG and was seen by me.  He was on dilantin  previously last admission, but had to stop taking this medication.  It  has not been clearly delineated that the patient has epilepsy.  The  impression at that time was that the seizures were due to alcohol  withdrawal.  He also had significant hypomagnesemia and  thrombocytopenia, along with elevated LFTs on the last admission.  The  patient is in the ICU because of being unresponsive after the ictus.  He  is getting Ativan per DT precautions 2 mg every 8 hours.  The last dose  was held because of significant decreased level of consciousness.   PAST MEDICAL HISTORY:  1. Alcohol withdrawal seizures.  2. Head injury in the past.  3. Alcoholism.   MEDICATIONS:  None listed.   SOCIAL HISTORY:  He lives with his family.  Consumes large amounts of  alcohol.  No illicit drug use.  He does use tobacco.   PHYSICAL EXAMINATION:  GENERAL:  Shows a somewhat unkempt man in no  acute distress.  VITAL SIGNS:  Afebrile with vital signs stable.  NECK:  Supple.  HEENT:  The patient has proptotic exotropic left eye with significant  conjunctival injection.  ABDOMEN:  Soft.  He does have a large inguinal hernia on the right.  EXTREMITIES:  No significant  edema.  MENTATION:  The patient lays in bed with eyes closed.  He does move his  eyes partially to sternal rub and groans and mumbles a couple words that  is incomprehensible.  He attempts to follow commands, but clearly doses  off quite easily.  CRANIAL NERVE EVALUATION:  The right pupil is 3 mm and reactive.  He  does have full visual movements, extraocular movement on the right  passively.  The patient's muscle strength is slightly asymmetric on the  left orbicularis oculi due to the left orbit, but otherwise normal.  Motor examination:  He does have antigravity strength bilaterally.  Bulk  and tone seems normal.  Reflexes are diminished in the legs  significantly, they are slightly diminished in the upper extremities.  Coordination shows no dyssymmetry, there is no tremors or parkinsonism  observed.   LABORATORY DATA:  Alcohol level undetected.  UDS negative.  Head CT scan  shows nothing acute.  Calcium 9.4, sodium 137, potassium 3.6, chloride  98, CO2 of 23, BUN 9, creatinine 0.9, glucose 368, myoglobin 103.  Arterial blood gas; pH 7.28, pCO2 of 48, pO2 of 98.   IMPRESSION:  1. Encephalopathy/altered mentation due to postictal state and      medication effect.  I suspect that the patient is somewhat sedated      from Ativan.  2. Alcohol withdrawal seizures.  At this point, I do not believe the      patient is epileptic.  In fact, he did have an EEG done yesterday      which was read and showed no epileptiform activity.  3. Alcoholism.   RECOMMENDATIONS:  Continue with primary care.  He is on thiamine.  We  probably should consider using the Ativan more on a p.r.n. basis to  reduce the risk of over sedation.  I would not recommend anti-epileptic  medication at this time as the patient has not clearly demonstrated to  him that he is epileptic, and I believe this spells are alcohol  withdrawal.  The issue of noncompliance is also a factor.  Thanks for  this  consultation.      Kofi A. Gerilyn Pilgrim, M.D.  Electronically Signed     KAD/MEDQ  D:  08/14/2008  T:  08/14/2008  Job:  [id number]

## 2010-08-02 NOTE — Group Therapy Note (Signed)
NAME:  [name], [name]               ACCOUNT NO.:  [id number]   MEDICAL RECORD NO.:  [id number]          PATIENT TYPE:  INP   LOCATION:  A316                          FACILITY:  [facility]   PHYSICIAN:  Kofi A. Gerilyn Pilgrim, M.D. DATE OF BIRTH:  Dec 27, 1950   DATE OF PROCEDURE:  08/18/2008  DATE OF DISCHARGE:                                 PROGRESS NOTE   SUBJECTIVE:  The patient is on the floor.  He is awake and alert.  He  converses fairly well.  He did have significant dysarthria which is  baseline.  The patient is oriented x2.  However, no further seizure  activities are observed.  The patient was counseled again about drinking  and the potential complications of a seizure.  Given that we believe  these are alcohol withdrawal, we do not recommend antiepileptic  medications.      Kofi A. Gerilyn Pilgrim, M.D.  Electronically Signed     KAD/MEDQ  D:  08/18/2008  T:  08/18/2008  Job:  [id number]

## 2010-08-02 NOTE — Procedures (Signed)
NAME:  REINHART, SAULTERS               ACCOUNT NO.:  [id number]   MEDICAL RECORD NO.:  [id number]        PATIENT TYPE:  PINP   LOCATION:  IC09                          FACILITY:  [facility]   PHYSICIAN:  Kofi A. Gerilyn Pilgrim, M.D. DATE OF BIRTH:  01-21-1951   DATE OF PROCEDURE:  DATE OF DISCHARGE:                              EEG INTERPRETATION   The patient is a 60 year old male who presents with seizures,  unresponsiveness, and altered mental status.   MEDICATIONS:  Glimepiride, metformin, Ativan, insulin.   ANALYSIS:  A 16-channel recording is conducted for approximately 20  minutes.  The recording is mostly replete with delta slowing about 3  hertz throughout.  The patient, however, does moan and groan and does  have increased activity that gets as high as 10 hertz background  activity.  The arousal activity is low voltage activity.  Photic  stimulation is carried out without significant changes in the background  activity.  The patient does have sleep with stage II and K-complex is  observed.  No focal or lateralized slowing.  No epileptiform activity  observed.   IMPRESSION:  Unremarkable recording of awake and sleep states.      Kofi A. Gerilyn Pilgrim, M.D.  Electronically Signed     KAD/MEDQ  D:  08/14/2008  T:  08/15/2008  Job:  [id number]

## 2010-08-02 NOTE — Consult Note (Signed)
NAME:  [name], [name] NO.:  [id number]   MEDICAL RECORD NO.:  [id number]          PATIENT TYPE:  EMS   LOCATION:  ED                            FACILITY:  [facility]   PHYSICIAN:  Osvaldo Shipper, MD     DATE OF BIRTH:  07-29-1950   DATE OF CONSULTATION:  07/31/2008  DATE OF DISCHARGE:                                 CONSULTATION   Person requesting consultation is the ED physician.   REASON FOR CONSULTATION:  Hyperglycemia.   The patient's primary medical doctor is Dr. Jorene Guest at [facility].   CHIEF COMPLAINT:  Frequent urination   HISTORY OF PRESENT ILLNESS:  The patient is a 60 year old African  American male who was last admitted to our service back in February when  he was diagnosed with alcohol abuse.  He underwent alcohol withdrawal  seizures.  He also has history of hypertension and possibly even liver  disease.  In any case the patient lives with his sister.  He apparently  has a habit of eating a lot of sweets and candies and ice cream.  And  then about a few days ago he started urinating quite frequently.  He  started feeling weak.  Denies any dysuria.  Denies any nausea, vomiting,  diarrhea.  No pain anywhere,.  These symptoms persisted so he decided to  come into the hospital.  He never has been diagnosed with diabetes in  the past.   MEDICATIONS AT HOME:  Unfortunately he does not remember what he takes  but he tells me that he is on Dilantin for his seizures and he also  takes multivitamins.   ALLERGIES:  NO KNOWN DRUG ALLERGIES.   PAST MEDICAL HISTORY:  Positive for seizures and hypertension although  he is not on any antihypertensive agent.  Actually I do not know if he  takes anything for blood pressure.  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   SURGICAL HISTORY:  Unremarkable.   FAMILY HISTORY AND SOCIAL HISTORY:  Unable to do on this gentleman who  is not able  to clearly provide history.   REVIEW OF SYSTEMS:  Also unable to do.   PHYSICAL EXAMINATION:  VITAL SIGNS:  Temperature 97.4, blood pressure  105/65, heart rate 64, respiratory 14, saturation 99% on room air.  GENERAL:  This is a well-developed, well-nourished black male in no  distress.  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  They say that this has been present  since early childhood. 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  His oral mucous membranes are moist.  No oral lesions are noted.  NECK:  Soft and supple.  No thyromegaly is appreciated.  LUNGS:  Clear to auscultation bilaterally.  No wheezing, rales or  rhonchi.  CARDIOVASCULAR:  S1 and S2  is normal.  Regular.  No murmurs  appreciated.  No S3-S4.  No rubs, no bruits.  ABDOMEN:  Soft, nontender, nondistended.  Bowel sounds present.  No  masses or organomegaly is appreciated.  GU:  He does have inguinal herniation, which has been evaluated by a  surgeon in the past.  I did not know if the patient has followed up or  not.  No edema is noted.  MUSCULOSKELETAL:  Exam otherwise unremarkable.  NEUROLOGICALLY:  No focal deficits are present.  The patient is alert,  however, sometimes incomprehensible.   Labs:  His CBC is unremarkable.  BMET showed a sodium of 132, chloride  of 92, potassium 3.7, bicarb is 27, glucose was 323, BUN is 9,  creatinine 0.8, bilirubin is normal, alk phos was 236, AST is 45.  UA  shows specific gravity less than 1.005.  Urine glucose greater than  1000.  Some ketones were noted, negative nitrite, negative leukocytes, 7-  10 WBCs,  some mucus was present, some yeast.   ASSESSMENT:  This is a 60 year old [place] American male who presents  with polyuria and appears to have new-onset diabetes.  The patient is  used to consuming a lot of sugar products.  He has abnormal UA but does  not have any evidence for UTI at this time.  He does have glucosuria.   PLAN:  1. New-onset diabetes.  We were called to  evaluate this patient for      admission.  He he is not acidotic. His bicarb on the BMET  is      normal.  He does not have an anion gap.  So this is type 2      diabetes.  The patient does not have any nausea, vomiting and he is      at his functional baseline.  He does have some evidence of mental      retardation.  However, he lives with his sister whom I spoke with      and he also has a niece who is also involved in taking care of him.      So at this time the patient, in view of good social support, does      not need to be admitted to the hospital.  He was given 20 units of      insulin and with which his blood sugar has come down to 203.  He      also was given a lot of IV fluids.  He will be started on metformin      500 mg b.i.d. and glipizide 2.5 mg b.i.d..  Prescription has been      provided to him for these medications.  He has been provided with      information sheet on diabetes including what kind of diet he needs      to follow.  He has been told to call his doctor's office either      tonight or tomorrow or on Monday to schedule an appointment.  I      have emphasized the importance of doing so.  I also explained to      the sister and the niece as well as the patient that if at any      point he appears to be tremulous, shaky, his behavior is abnormal      or if he starts having symptoms again or if he starts having      nausea, vomiting they need to bring him back to the hospital for      further evaluation.   A prescription for HbA1c was written and provided for the patient to  have it checked at his doctor's office.  So at this point the patient does not really meet criteria to be  admitted to the hospital and this can be managed as an outpatient.      Osvaldo Shipper, MD  Electronically Signed     GK/MEDQ  D:  07/31/2008  T:  07/31/2008  Job:  [id number]   cc:   [facility]/ Att:  Dr.

## 2010-08-02 NOTE — Group Therapy Note (Signed)
NAME:  [name], [name] NO.:  [id number]   MEDICAL RECORD NO.:  [id number]          PATIENT TYPE:  INP   LOCATION:  A323                          FACILITY:  [facility]   PHYSICIAN:  Osvaldo Shipper, MD     DATE OF BIRTH:  January 05, 1951   DATE OF PROCEDURE:  05/03/2008  DATE OF DISCHARGE:                                 PROGRESS NOTE   SUBJECTIVE:  The patient is a little bit more alert and lucid, although  he it is still very difficult to understand what he is saying because of  his slurred speech.   He denies any complaints but does not have any pain at this time.   OBJECTIVE:  VITAL SIGNS:  Temperature 99.9 this morning, it was 100.1  yesterday early morning, it was 99.3 last evening, respiratory rate is  20, heart rate 110, blood pressure is 134/83, saturation 94% on room  air.  Ins and outs he was positive for 5 liters yesterday, made about  1100 mL of urine yesterday.  HEENT:  There is no pallor, no icterus.  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  He has slurred speech.  NECK:  Soft and supple.  LUNGS:  Clear to auscultation bilaterally with diffuse air entry at the  bases.  CARDIOVASCULAR:  S1, S2 is tachycardic, regular, no murmurs appreciated.  No S3, S4.  No rubs, no bruits.  ABDOMEN:  Soft, nontender, nondistended.  Bowel sounds are present.  No  masses or organomegaly is appreciated.  EXTREMITIES:  No edema.  NEUROLOGIC:  He is awake, alert, orientation could not be tested.  Does  not seem to have any focal deficits.   LAB DATA:  CBG was 67 last night, 148 this morning.  A CBC, BMET,  magnesium, ammonia level are all pending at this time.   ASSESSMENT AND PLAN:  1. Fever, seems to be improving.  He is on ceftriaxone empirically.      Etiology is unclear.  2. Alcohol withdrawal.  He is stable.  He is a little bit tachycardic.      He is on the Ativan protocol and is on thiamine.  3. Alcohol withdrawal seizure.  He is on  Dilantin three times daily      which should be continued for now, I think they can switch the      medication to by mouth.  4. He has thrombocytopenia which had been stable up until yesterday.      Today's laboratories are pending.  5. Tachycardia, regular, likely sinus.  Continue to monitor.  Etiology      likely is alcohol withdrawal.  6. Mild hyponatremia.  Also pending from this morning.  Sodium was 129      yesterday.  Intravenous fluids were changed.  7. Right-sided inguinal hernia.  Outpatient follow-up.  8. Abnormal LFTs with liver cirrhosis.  A GI consult is pending.  He      had a slightly elevated ammonia level for which lactulose was      initiated.  9. He is  a Full Code.  I am hoping he would start coming out of his      withdrawal soon.  I think he already is, but he should stabilize a      little bit more in the next day or so.   His home environment will need to be clarified before he can  be  discharged when stable.      Osvaldo Shipper, MD  Electronically Signed     GK/MEDQ  D:  05/03/2008  T:  05/03/2008  Job:  [id number]

## 2010-08-02 NOTE — Group Therapy Note (Signed)
NAME:  [name], [name]               ACCOUNT NO.:  [id number]   MEDICAL RECORD NO.:  [id number]          PATIENT TYPE:  INP   LOCATION:  A316                          FACILITY:  [facility]   PHYSICIAN:  Kofi A. Gerilyn Pilgrim, M.D. DATE OF BIRTH:  Feb 22, 1951   DATE OF PROCEDURE:  DATE OF DISCHARGE:                                 PROGRESS NOTE   No seizure reported.  The patient is awake and eating breakfast, again  we think he has alcohol withdrawal seizures.  Therefore, we do not want  to recommend antiepileptic medications.  We are going to go ahead and  sign off, reconsult as needed.      Kofi A. Gerilyn Pilgrim, M.D.  Electronically Signed     KAD/MEDQ  D:  08/19/2008  T:  08/20/2008  Job:  [id number]
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
## 2010-08-02 NOTE — Group Therapy Note (Signed)
NAME:  [name], [name]               ACCOUNT NO.:  [id number]   MEDICAL RECORD NO.:  [id number]          PATIENT TYPE:  INP   LOCATION:  A323                          FACILITY:  [facility]   PHYSICIAN:  Osvaldo Shipper, MD     DATE OF BIRTH:  [birth year]   DATE OF PROCEDURE:  05/02/2008  DATE OF DISCHARGE:                                 PROGRESS NOTE   SUBJECTIVE:  The patient is a little bit more responsive this morning.  However, it is very difficult to understand what he is trying to say,  his speech is very incomprehensible.  He does not seem to be having any  complaints to offer at this time.   OBJECTIVE:  VITAL SIGNS:  He spiked temperature to 101.6 last night,  temperature after that was 100.1.  Heart rate 114, respiratory 18, blood  pressure 131/89.  Saturation 92% on room air.  GENERAL:  This is a disheveled male in no distress.  HEENT:  There is no pallor, no icterus.  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  His lips are swollen which appear to be his baseline.  LUNGS:  Poor air entry at the bases, but mostly clear to auscultation  bilaterally.  CARDIOVASCULAR:  S1-S2 is normal, regular.  No murmurs appreciated.  No  S3-S4, no rubs, no bruits.  ABDOMEN:  Soft, nontender, nondistended.  Bowel sounds are present.  No  masses or organomegaly appreciated.  EXTREMITIES:  Show no edema.  NEUROLOGIC:  He is drowsy with arousable, but I do not understand what  he is saying, but I do not see any neurological deficits.   LABORATORY DATA:  His CBC shows a white count elevation of 11.4,  hemoglobin is 14.3, platelet count is 89.  His BMET and his LFTs are  still pending.  Dilantin level was 8.3 yesterday night, but his albumin  is low, so dilantin level probably in the therapeutic range.  Ammonia  level yesterday was 31, pending from this morning.  UA was negative for  nitrite and leukocytes, did show 7-10 WBCs and rare bacteria.  Chest x-  ray showed low lung volumes with bibasilar  atelectasis, slight improved  aeration on the left.  He also had an ultrasound of the abdomen which  showed cholelithiasis with borderline CBD dilatation and minimally  prominent intrahepatic biliary radicles, cirrhotic appearing liver.  Inadequate visualization of the pancreatic head was noted.  Otherwise,  no other abnormalities appreciated.   ASSESSMENT/PLAN:  1. Fever, etiology unclear.  It could be because of atelectasis.  I      have empirically started him on ceftriaxone.  A blood culture will      be sent off.  2. Alcohol withdrawal, stable.  He is on the Ativan protocol.  He is      on thiamine.  3. Alcohol withdrawal seizure.  He is on Cerebyx t.i.d.  I think in      the next day or so we can switch it to oral.  4. Thrombocytopenia, stable.  5. Mild hyponatremia.  It was improving as of yesterday.  Labs are      pending this morning.  6. Hyperglycemia.  HbA1c is 6.7.  His CBGs have been running      reasonably well at this time.  I do not think it requires any oral      agents for now.  7. Right-sided inguinal hernia.  Elective procedure in the near future      as an outpatient.  8. Abnormal liver function tests.  Hepatitis panel is pending.      Ultrasound did show cholelithiasis, although I do      not think he definitely has cholecystitis.  However, we will      monitor him closely.  If his fevers continue, this is something      that may have to be considered.   So, continue current treatment and monitor this patient closely.      Osvaldo Shipper, MD  Electronically Signed     GK/MEDQ  D:  05/02/2008  T:  05/02/2008  Job:  [id number]

## 2010-08-02 NOTE — Group Therapy Note (Signed)
NAME:  [name], [name]               ACCOUNT NO.:  [id number]   MEDICAL RECORD NO.:  [id number]          PATIENT TYPE:  INP   LOCATION:  A323                          FACILITY:  [facility]   PHYSICIAN:  Osvaldo Shipper, MD     DATE OF BIRTH:  10-01-1950   DATE OF PROCEDURE:  04/30/2008  DATE OF DISCHARGE:                                 PROGRESS NOTE   SUBJECTIVE:  The patient is asleep this morning.  Upon trying to wake  him up, he did move and budge, but he would not open his eyes.  This is  most likely medication effect.   OBJECTIVE:  VITAL SIGNS:  His heart rate was 103 this morning.  His  temperature overnight was 100.0.  The rest of the vital signs are all  pending.  GENERAL:  This is a disheveled African American male who appears to be  in no distress.  He appears to be quite comfortable.  HEENT:  There is no pallor, no icterus.  His left eye as mentioned  earlier is artificial.  He has got some swelling of his lips, which is  also not new compared to yesterday.  Unclear what his baseline is.  LUNGS:  Clear to auscultation bilaterally anteriorly.  CARDIOVASCULAR:  S1-S2 normal regular.  No murmurs appreciated.  No S3-  S4.  No rubs, no bruits.  ABDOMEN:  Soft, nontender, nondistended.  Bowel sounds are present.  No  masses or organomegaly appreciated.  GU:  Reveals an inguinal herniation on the right side.  EXTREMITIES:  Show no edema.  SKIN:  Multiple excoriations.   LABORATORY DATA:  All labs are pending from this morning.   ASSESSMENT/PLAN:  1. Seizure, likely alcohol withdrawal.  He continues to be on Dilantin      t.i.d., which we will continue for now.  EEG is pending.  Neurology      consultation may be obtained in the next day or two.  2. Alcohol withdrawal.  He is on the Ativan protocol.  He is on      thiamine.  We will start him on clonidine as needed in case he      becomes hypertensive.  We will monitor him on telemetry.  3. Thrombocytopenia, likely a result of his  alcoholism.  This will      also be monitored quite closely.  4. Hypomagnesemia.  We are awaiting the peak levels from this morning.  5. Hyperglycemia.  HbA1c is pending.  He is on CBGs q.a.c. and h.s.  6. Inguinal hernia.  We are awaiting a non-urgent surgical      consultation to evaluate him for possible elective procedure in the      near future.  7. Liver function tests are pending.  Deep venous thrombosis      prophylaxis with sequential compressive devices.  8. He is also on proton pump inhibitor.  9. So at this point, the patient continues to be medically stable but      requiring inpatient treatment.      Osvaldo Shipper, MD  Electronically Signed  GK/MEDQ  D:  04/30/2008  T:  04/30/2008  Job:  [id number]   cc:   Malvin Johns, MD

## 2010-08-02 NOTE — Group Therapy Note (Signed)
NAMEBRASON, [name]               ACCOUNT NO.:  [id number]   MEDICAL RECORD NO.:  [id number]          PATIENT TYPE:  INP   LOCATION:  A323                          FACILITY:  [facility]   PHYSICIAN:  Osvaldo Shipper, MD     DATE OF BIRTH:  August 24, 1950   DATE OF PROCEDURE:  05/05/2008  DATE OF DISCHARGE:                                 PROGRESS NOTE   SUBJECTIVE:  The patient is asleep.  He wakes up, but again it is very  difficult to understand him.   OBJECTIVE:  VITAL SIGNS:  Show that he is afebrile 97.1, heart rate 97,  blood pressure 138/89, respiratory rate 18, saturation 95% on room air.  LUNGS:  Clear to auscultation bilaterally anteriorly.  CARDIOVASCULAR:  S1-S2 is normal, regular.  No murmurs appreciated.  No  S3-S4.  No rubs, no bruits.  ABDOMEN:  Soft, nontender, nondistended.  Bowel sounds are present.  No  masses or organomegaly appreciated.  EXTREMITIES:  No pedal edema is present.  NEUROLOGICAL:  He is somnolent, arousable, disoriented.   No other focal deficits present.   LABORATORY DATA:  His platelet count has improved to 140,000.  The rest  of the parameters on the CBC are normal.  Sodium has improved to 133,  glucose 119, potassium is 4.0.  Renal function is normal.  Bilirubin is  1.3, alk phos 181, AST 52, albumin is 2.6.   EEG has been read and was a normal recording.   ASSESSMENT/PLAN:  1. Fever, resolved.  It looks like the ceftriaxone was discontinued by      mistake.  I will go ahead and start this patient on Vantin.  This      will be hopefully day 4 of his antibiotics.  2. Alcohol withdrawal.  I think he has most recovered from this.  His      Ativan protocol is almost over.  He is also on Haldol p.r.n. for      his behavioral issues.  I am hoping that this can be switched over      to p.o. soon or maybe we can just start him on a low-dose of      Risperdal.  3. Alcohol withdrawal seizures.  He was seen by Dr. Gerilyn Pilgrim yesterday      who has decided to  discontinue the Dilantin which we agree with at      this time.  4. 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      Gerilyn Pilgrim also feels that he could also have Wernicke's      encephalopathy.  He is on thiamine which will be continued.  We      will also put him on multivitamins.  5. Thrombocytopenia, improving.  6. 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  7. Mild hyponatremia, stable.  8. Right-sided inguinal hernia.  Outpatient follow-up with Dr.      Malvin Johns.  9. His fluid rate will be decreased today.  His swallow evaluation is      still pending.  Electrolytes  were all stable.   We also appreciate GI input on this matter.   Today, I will ask social worker to pursue placement and discuss with the  family regarding placement options.  I am not sure what this patient's  baseline is regarding his mental status and this will also need to be  clarified with the patient's family.  In the stage that he is at this  time, it is very unlikely he will be able to safely go home.  So, we  will continue to monitor him and we will reevaluate the whole situation  tomorrow.      Osvaldo Shipper, MD  Electronically Signed     GK/MEDQ  D:  05/05/2008  T:  05/05/2008  Job:  [id number]
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
## 2010-12-19 ENCOUNTER — Observation Stay (HOSPITAL_COMMUNITY)
Admission: EM | Admit: 2010-12-19 | Discharge: 2010-12-22 | Disposition: A | Discharge status: Home Health | Payer: Medicare Other | Attending: Internal Medicine | Admitting: Internal Medicine

## 2010-12-19 ENCOUNTER — Emergency Department (HOSPITAL_COMMUNITY): Payer: Medicare Other

## 2010-12-19 ENCOUNTER — Encounter: Payer: Self-pay | Admitting: Emergency Medicine

## 2010-12-19 DIAGNOSIS — Z79899 Other long term (current) drug therapy: Secondary | ICD-10-CM | POA: Insufficient documentation

## 2010-12-19 DIAGNOSIS — R269 Unspecified abnormalities of gait and mobility: Secondary | ICD-10-CM | POA: Insufficient documentation

## 2010-12-19 DIAGNOSIS — R739 Hyperglycemia, unspecified: Secondary | ICD-10-CM

## 2010-12-19 DIAGNOSIS — E1165 Type 2 diabetes mellitus with hyperglycemia: Secondary | ICD-10-CM | POA: Diagnosis present

## 2010-12-19 DIAGNOSIS — E871 Hypo-osmolality and hyponatremia: Secondary | ICD-10-CM | POA: Insufficient documentation

## 2010-12-19 DIAGNOSIS — IMO0001 Reserved for inherently not codable concepts without codable children: Principal | ICD-10-CM | POA: Insufficient documentation

## 2010-12-19 DIAGNOSIS — K4091 Unilateral inguinal hernia, without obstruction or gangrene, recurrent: Secondary | ICD-10-CM | POA: Insufficient documentation

## 2010-12-19 DIAGNOSIS — I10 Essential (primary) hypertension: Secondary | ICD-10-CM | POA: Insufficient documentation

## 2010-12-19 DIAGNOSIS — G40909 Epilepsy, unspecified, not intractable, without status epilepticus: Secondary | ICD-10-CM | POA: Insufficient documentation

## 2010-12-19 HISTORY — DX: Liver disease, unspecified: K76.9

## 2010-12-19 HISTORY — DX: Unspecified abdominal hernia without obstruction or gangrene: K46.9

## 2010-12-19 HISTORY — DX: Unspecified convulsions: R56.9

## 2010-12-19 HISTORY — DX: Alcohol dependence, uncomplicated: F10.20

## 2010-12-19 HISTORY — DX: Benign prostatic hyperplasia without lower urinary tract symptoms: N40.0

## 2010-12-19 LAB — DIFFERENTIAL
Eosinophils Absolute: 0.1 10*3/uL (ref 0.0–0.7)
Eosinophils Relative: 1 % (ref 0–5)
Lymphs Abs: 2.7 10*3/uL (ref 0.7–4.0)
Monocytes Relative: 7 % (ref 3–12)

## 2010-12-19 LAB — URINE MICROSCOPIC-ADD ON

## 2010-12-19 LAB — GLUCOSE, CAPILLARY
Glucose-Capillary: 383 mg/dL — ABNORMAL HIGH (ref 70–99)
Glucose-Capillary: 343 mg/dL — ABNORMAL HIGH (ref 70–99)

## 2010-12-19 LAB — BASIC METABOLIC PANEL
BUN: 6 mg/dL (ref 6–23)
Calcium: 9.3 mg/dL (ref 8.4–10.5)
GFR calc non Af Amer: >90 mL/min (ref >90)
Glucose, Bld: 389 mg/dL — ABNORMAL HIGH (ref 70–99)

## 2010-12-19 LAB — RAPID URINE DRUG SCREEN, HOSP PERFORMED
Barbiturates: NOT DETECTED
Benzodiazepines: NOT DETECTED

## 2010-12-19 LAB — URINALYSIS, ROUTINE W REFLEX MICROSCOPIC
Bilirubin Urine: NEGATIVE
Specific Gravity, Urine: 1.01 (ref 1.005–1.030)
Urobilinogen, UA: 0.2 mg/dL (ref 0.0–1.0)

## 2010-12-19 LAB — CBC
Hemoglobin: 12.4 g/dL — ABNORMAL LOW (ref 13.0–17.0)
MCH: 25.8 pg — ABNORMAL LOW (ref 26.0–34.0)
MCV: 77.1 fL — ABNORMAL LOW (ref 78.0–100.0)
RBC: 4.8 MIL/uL (ref 4.22–5.81)

## 2010-12-19 MED ORDER — INSULIN ASPART 100 UNIT/ML ~~LOC~~ SOLN
0.0000 [IU] | Freq: Three times a day (TID) | SUBCUTANEOUS | Status: DC
  Administered 2010-12-20 – 2010-12-21 (×4): 11 [IU] via SUBCUTANEOUS
  Administered 2010-12-21: 8 [IU] via SUBCUTANEOUS
  Administered 2010-12-22: 3 [IU] via SUBCUTANEOUS
  Administered 2010-12-22: 11 [IU] via SUBCUTANEOUS
  Filled 2010-12-19: qty 3

## 2010-12-19 MED ORDER — SODIUM CHLORIDE 0.9 % IV SOLN
INTRAVENOUS | Status: AC
  Administered 2010-12-20 (×2): via INTRAVENOUS

## 2010-12-19 MED ORDER — ACETAMINOPHEN 650 MG RE SUPP: 650.0000 mg | Freq: Four times a day (QID) | RECTAL | Status: DC | PRN

## 2010-12-19 MED ORDER — INSULIN GLARGINE 100 UNIT/ML ~~LOC~~ SOLN
34.0000 [IU] | Freq: Every day | SUBCUTANEOUS | Status: DC
  Administered 2010-12-20: 34 [IU] via SUBCUTANEOUS
  Filled 2010-12-19: qty 3

## 2010-12-19 MED ORDER — SODIUM CHLORIDE 0.9 % IV BOLUS (SEPSIS)
1000.0000 mL | Freq: Once | INTRAVENOUS | Status: AC
  Administered 2010-12-19: 1000 mL via INTRAVENOUS

## 2010-12-19 MED ORDER — NICOTINE 14 MG/24HR TD PT24
14.0000 mg | MEDICATED_PATCH | Freq: Every day | TRANSDERMAL | Status: DC
  Administered 2010-12-20 – 2010-12-22 (×4): 14 mg via TRANSDERMAL
  Filled 2010-12-19 (×3): qty 1

## 2010-12-19 MED ORDER — ACETAMINOPHEN 325 MG PO TABS: 650.0000 mg | ORAL_TABLET | Freq: Four times a day (QID) | ORAL | Status: DC | PRN

## 2010-12-19 MED ORDER — ALBUTEROL SULFATE (5 MG/ML) 0.5% IN NEBU
2.5000 mg | INHALATION_SOLUTION | Freq: Four times a day (QID) | RESPIRATORY_TRACT | Status: DC
  Administered 2010-12-19 – 2010-12-22 (×8): 2.5 mg via RESPIRATORY_TRACT
  Filled 2010-12-19 (×9): qty 0.5

## 2010-12-19 MED ORDER — INSULIN ASPART 100 UNIT/ML ~~LOC~~ SOLN
0.0000 [IU] | Freq: Every day | SUBCUTANEOUS | Status: DC
  Administered 2010-12-20: 4 [IU] via SUBCUTANEOUS
  Administered 2010-12-21: 2 [IU] via SUBCUTANEOUS

## 2010-12-19 MED ORDER — ONDANSETRON HCL 4 MG PO TABS: 4.0000 mg | ORAL_TABLET | Freq: Four times a day (QID) | ORAL | Status: DC | PRN

## 2010-12-19 MED ORDER — ASPIRIN EC 81 MG PO TBEC
81.0000 mg | DELAYED_RELEASE_TABLET | Freq: Every day | ORAL | Status: DC
  Administered 2010-12-20 – 2010-12-22 (×3): 81 mg via ORAL
  Filled 2010-12-19 (×3): qty 1

## 2010-12-19 MED ORDER — THERA M PLUS PO TABS
1.0000 | ORAL_TABLET | Freq: Every day | ORAL | Status: DC
  Administered 2010-12-20 – 2010-12-22 (×3): 1 via ORAL
  Filled 2010-12-19 (×3): qty 1

## 2010-12-19 MED ORDER — LINAGLIPTIN 5 MG PO TABS
5.0000 mg | ORAL_TABLET | Freq: Every day | ORAL | Status: DC
  Administered 2010-12-20 – 2010-12-22 (×3): 5 mg via ORAL
  Filled 2010-12-19 (×4): qty 1

## 2010-12-19 MED ORDER — INSULIN ASPART 100 UNIT/ML ~~LOC~~ SOLN
10.0000 [IU] | Freq: Once | SUBCUTANEOUS | Status: AC
  Administered 2010-12-19: 10 [IU] via SUBCUTANEOUS

## 2010-12-19 MED ORDER — INSULIN ASPART 100 UNIT/ML ~~LOC~~ SOLN
3.0000 [IU] | Freq: Three times a day (TID) | SUBCUTANEOUS | Status: DC
  Administered 2010-12-20 – 2010-12-22 (×7): 3 [IU] via SUBCUTANEOUS

## 2010-12-19 MED ORDER — METOPROLOL TARTRATE 25 MG PO TABS
12.5000 mg | ORAL_TABLET | Freq: Two times a day (BID) | ORAL | Status: DC
  Administered 2010-12-20 – 2010-12-22 (×6): 12.5 mg via ORAL
  Filled 2010-12-19 (×6): qty 1

## 2010-12-19 MED ORDER — ONDANSETRON HCL 4 MG/2ML IJ SOLN: 4.0000 mg | Freq: Four times a day (QID) | INTRAMUSCULAR | Status: DC | PRN

## 2010-12-19 NOTE — ED Notes (Signed)
Jim Shi, MD at the bedside for evaluation of the patient.

## 2010-12-19 NOTE — ED Notes (Addendum)
Patient c/o pain in right testicle. Patient seen at [facility] and was sent here. Patient has a hx of large hernia with bowel into right testicle as well as enlarged prostate. Patient denies any abd pain, nausea, vomiting, or diarrhea.

## 2010-12-19 NOTE — H&P (Signed)
[name] is an 60 y.o. male.    PCP: Reynolds Bowl, MD   Chief Complaint: Groin pain  HPI: This is a 60 year old, African American male, with a past medical history of diabetes, who was in his usual state of health earlier today when he went to his physician's office with complaints of pain in his groin. Apparently, this is been going on for the last couple weeks or so. He also mentioned feeling weak. Denies any nausea, vomiting. The pain is not, severe. It's about 3-4 of 10 in intensity. Patient, unfortunately, is a poor historian. He reports a normal bowel movement in the last 2 days. He reports passing gas. He reports having seizure disorder and he tells me that he supposedly takes Dilantin 3 times a day. However, I am not able to verify this history. The note from his PCP's office does not mention Dilantin. We will need to verify this from his pharmacy. However, he tells me his last seizure was greater than a month ago. He does tell me that he does take Dilantin 3 times a day. In the emergency department his blood sugars was found to be greater than 350. He was given insulin however, his blood sugars are not improving and that's why the hospitalist was called for evaluation.   Prior to Admission medications   Medication Sig Start Date End Date Taking? Authorizing Provider  aspirin EC 81 MG tablet Take 81 mg by mouth daily.     Yes Historical Provider, MD  glipiZIDE (GLUCOTROL) 10 MG tablet Take 10 mg by mouth 2 (two) times daily before a meal.     Yes Historical Provider, MD  insulin glargine (LANTUS) 100 UNIT/ML injection Inject 28 Units into the skin at bedtime.     Yes Historical Provider, MD  metoprolol tartrate (LOPRESSOR) 25 MG tablet Take 12.5 mg by mouth 2 (two) times daily.     Yes Historical Provider, MD  Multiple Vitamins-Minerals (MULTIVITAMINS THER. W/MINERALS) TABS Take 1 tablet by mouth daily.     Yes Historical Provider, MD  saxagliptin HCl (ONGLYZA) 2.5 MG TABS tablet  Take 5 mg by mouth daily.     Yes Historical Provider, MD   The dose of his Dilantin and if he indeed is taking it needs to be verified.  Allergies: No Known Allergies  Past Medical History  Diagnosis Date  . Diabetes mellitus   . Liver disease   . Enlarged prostate   . Hernia   . Malignant neoplasm of liver   . Alcohol dependence   . Seizures     Past Surgical History  Procedure Date  . Appendectomy     Social History:  reports that he has been smoking Cigarettes.  He has a 60 pack-year smoking history. He has never used smokeless tobacco. He reports that he drinks alcohol. He reports that he does not use illicit drugs.  Family History:  Family History  Problem Relation Age of Onset  . Diabetes Mother   . Hypertension Mother   . Cancer Father   . Stomach cancer Father   . Cancer Sister   . Hypertension Other     Review of Systems  Constitutional: Negative.   HENT: Negative.   Eyes: Negative.   Respiratory: Negative.   Cardiovascular: Negative.   Gastrointestinal: Positive for abdominal pain. Negative for nausea and vomiting.  Genitourinary: Negative.   Musculoskeletal: Negative.   Skin: Negative.   Neurological: Positive for seizures.  Endo/Heme/Allergies: Negative.   Psychiatric/Behavioral: Negative.  Blood pressure 106/71, pulse 86, temperature 98.3 F (36.8 C), temperature source Oral, resp. rate 18, height 5\' 9"  (1.753 m), weight 51.71 kg (114 lb), SpO2 95.00%. Physical Exam  Vitals reviewed. Constitutional: He is oriented to person, place, and time. He appears well-developed and well-nourished. No distress.  HENT:  Head: Normocephalic and atraumatic.  Mouth/Throat: No oropharyngeal exudate.  Eyes: Right eye exhibits no discharge. Left eye exhibits no discharge. No scleral icterus. Left eye exhibits abnormal extraocular motion. Left pupil is not round and not reactive. Pupils are unequal.  Neck: Normal range of motion. Neck supple. No JVD present.  No tracheal deviation present. No thyromegaly present.  Cardiovascular: Normal rate, regular rhythm and normal heart sounds.  Exam reveals no gallop and no friction rub.   No murmur heard. Pulmonary/Chest: Effort normal and breath sounds normal. No stridor. No respiratory distress. He has no wheezes. He has no rales. He exhibits no tenderness.  Abdominal: Soft. Normal appearance and bowel sounds are normal. He exhibits no shifting dullness, no fluid wave, no abdominal bruit and no mass. There is no hepatosplenomegaly. There is no tenderness. A hernia is present. Hernia confirmed positive in the right inguinal area.       Right inguinal hernia is easily reducible partially. Non tender.  Genitourinary: Penis normal.  Musculoskeletal: Normal range of motion.  Lymphadenopathy:    He has no cervical adenopathy.  Neurological: He is alert and oriented to person, place, and time. No cranial nerve deficit.  Skin: Skin is warm and dry. He is not diaphoretic.  Psychiatric: He has a normal mood and affect.    Results for orders placed during the hospital encounter of 12/19/10 (from the past 48 hour(s))  URINALYSIS, ROUTINE W REFLEX MICROSCOPIC     Status: Abnormal   Collection Time   12/19/10  5:01 PM      Component Value Range Comment   Color, Urine YELLOW  YELLOW     Appearance CLEAR  CLEAR     Specific Gravity, Urine 1.010  1.005 - 1.030     pH 6.0  5.0 - 8.0     Glucose, UA >1000 (*) NEGATIVE (mg/dL)    Hgb urine dipstick TRACE (*) NEGATIVE     Bilirubin Urine NEGATIVE  NEGATIVE     Ketones, ur NEGATIVE  NEGATIVE (mg/dL)    Protein, ur NEGATIVE  NEGATIVE (mg/dL)    Urobilinogen, UA 0.2  0.0 - 1.0 (mg/dL)    Nitrite NEGATIVE  NEGATIVE     Leukocytes, UA NEGATIVE  NEGATIVE    URINE MICROSCOPIC-ADD ON     Status: Abnormal   Collection Time   12/19/10  5:01 PM      Component Value Range Comment   Squamous Epithelial / LPF FEW (*) RARE     WBC, UA 0-2  <3 (WBC/hpf)    RBC / HPF 0-2  <3  (RBC/hpf)    Bacteria, UA RARE  RARE     Urine-Other FEW YEAST     URINE RAPID DRUG SCREEN (HOSP PERFORMED)     Status: Normal   Collection Time   12/19/10  5:58 PM      Component Value Range Comment   Opiates NONE DETECTED  NONE DETECTED     Cocaine NONE DETECTED  NONE DETECTED     Benzodiazepines NONE DETECTED  NONE DETECTED     Amphetamines NONE DETECTED  NONE DETECTED     Tetrahydrocannabinol NONE DETECTED  NONE DETECTED     Barbiturates NONE  DETECTED  NONE DETECTED    GLUCOSE, CAPILLARY     Status: Abnormal   Collection Time   12/19/10  6:05 PM      Component Value Range Comment   Glucose-Capillary 383 (*) 70 - 99 (mg/dL)    Comment 1 Documented in Chart      Comment 2 Notify RN     CBC     Status: Abnormal   Collection Time   12/19/10  6:10 PM      Component Value Range Comment   WBC 6.6  4.0 - 10.5 (K/uL)    RBC 4.80  4.22 - 5.81 (MIL/uL)    Hemoglobin 12.4 (*) 13.0 - 17.0 (g/dL)    HCT 62.1 (*) 30.8 - 52.0 (%)    MCV 77.1 (*) 78.0 - 100.0 (fL)    MCH 25.8 (*) 26.0 - 34.0 (pg)    MCHC 33.5  30.0 - 36.0 (g/dL)    RDW 65.7  84.6 - 96.2 (%)    Platelets 177  150 - 400 (K/uL)   DIFFERENTIAL     Status: Normal   Collection Time   12/19/10  6:10 PM      Component Value Range Comment   Neutrophils Relative 51  43 - 77 (%)    Neutro Abs 3.4  1.7 - 7.7 (K/uL)    Lymphocytes Relative 40  12 - 46 (%)    Lymphs Abs 2.7  0.7 - 4.0 (K/uL)    Monocytes Relative 7  3 - 12 (%)    Monocytes Absolute 0.5  0.1 - 1.0 (K/uL)    Eosinophils Relative 1  0 - 5 (%)    Eosinophils Absolute 0.1  0.0 - 0.7 (K/uL)    Basophils Relative 1  0 - 1 (%)    Basophils Absolute 0.1  0.0 - 0.1 (K/uL)   BASIC METABOLIC PANEL     Status: Abnormal   Collection Time   12/19/10  6:10 PM      Component Value Range Comment   Sodium 131 (*) 135 - 145 (mEq/L)    Potassium 3.5  3.5 - 5.1 (mEq/L)    Chloride 92 (*) 96 - 112 (mEq/L)    CO2 32  19 - 32 (mEq/L)    Glucose, Bld 389 (*) 70 - 99 (mg/dL)    BUN 6   6 - 23 (mg/dL)    Creatinine, Ser 9.52  0.50 - 1.35 (mg/dL)    Calcium 9.3  8.4 - 10.5 (mg/dL)    GFR calc non Af Amer >90  >90 (mL/min)    GFR calc Af Amer >90  >90 (mL/min)   GLUCOSE, CAPILLARY     Status: Abnormal   Collection Time   12/19/10  6:57 PM      Component Value Range Comment   Glucose-Capillary 343 (*) 70 - 99 (mg/dL)    Comment 1 Documented in Chart      Comment 2 Notify RN      No results found.   Assessment/Plan  Principal Problem:  *DM (diabetes mellitus), type 2, uncontrolled Active Problems:  Inguinal hernia recurrent unilateral  Seizure disorder  HTN (hypertension)   #1 uncontrolled diabetes, type II: We will put him back on his Lantus insulin at a slightly higher dose. We'll provide coverage with his meals. We'll check HbA1c. I think we can avoid putting the patient on an insulin infusion. Diabetes coordinator will be asked to see this patient tomorrow.  #2 inguinal hernia. Right-sided: Will have a  general surgeon evaluate this patient. We'll do an acute abdominal series. However, the hernia seems to be easily reducible at this time and so, I don't suspect any obstruction or incarceration. At this point, we can hold back on the CT. He did have a CAT scan in the last few months.  #3 questionable history of seizure disorder: We'll check a Dilantin level to see if there is any Dilantin in his system. We'll ask pharmacy to verify that he indeed is taking Dilantin. We'll put him on seizure precautions. If Dilantin is detected in his blood I will put him on Dilantin 3 times a day. If it is not detected we'll need to wait to verify that he isn't yet on Dilantin. In the, meantime, we'll check an EEG.  #4 history of hypertension: Blood pressure stable. Continue with current medications.  #5 mild hyponatremia: This is probably from hyperglycemia. We'll recheck levels in the morning.  DVT, prophylaxis with provided. Physical therapy and occupational therapy will be asked  to evaluate this patient.   We will await the input from general surgery as well. Since he does not have any symptoms of signs of obstruction we will give him a liquids and some diet tonight.  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  Patient is a full code.  Further management decisions will depend on results of further testing and patient's response to treatment.  [name] 12/19/2010, 8:37 PM

## 2010-12-19 NOTE — ED Notes (Signed)
Dr. Radford Pax informed of pt's re-check cbg.  IVF locked until further fluid order obtained.

## 2010-12-19 NOTE — ED Notes (Signed)
Patient lying in bed resting comfortably.  Patient awaiting bed assignment.

## 2010-12-19 NOTE — ED Provider Notes (Signed)
Scribed for Nelia Shi, MD, the patient was seen in room [facility] . This chart was scribed by Ellie Lunch. This patient's care was started at 5:38 PM.   CSN: [id number] Arrival date & time: 12/19/2010  4:37 PM  Chief Complaint  Patient presents with  . Testicle Pain    (Consider location/radiation/quality/duration/timing/severity/associated sxs/prior treatment) HPI Pt seen at 17:38 [name] is a 60 y.o. male referred to the emergency department by Dr. Margo Aye at [facility] for increased inguinal hernia swelling and <500 blood sugar with increase in thirst and eating.   Pt visited PCP today for painful testicle starting one week ago. Pain is located in testicle is constant rated 9/10 in severity. Pain is improved by nothing. Pt denies abd pain, n/v/d. Pt has history of large hernia since 5/12 with bowel into RT testicle, enlarged prostate, uncontrolled diabetes, and liver disease.   Additionally Family reports that Pt was suppose to have hernia surgery in march 2012 at [facility]  but it was not performed because attending Dr was unable to get permission from Pt's primary caretaker.   Past Medical History  Diagnosis Date  . Diabetes mellitus   . Liver disease   . Enlarged prostate   . Hernia   . Malignant neoplasm of liver   . Alcohol dependence   . Seizures     Past Surgical History  Procedure Date  . Appendectomy     Family History  Problem Relation Age of Onset  . Diabetes Mother   . Hypertension Mother   . Cancer Father   . Cancer Sister   . Hypertension Other     History  Substance Use Topics  . Smoking status: Current Everyday Smoker -- 1.5 packs/day for 40 years    Types: Cigarettes  . Smokeless tobacco: Never Used  . Alcohol Use: Yes     was alcohol dependent    Review of Systems 10 Systems reviewed and are negative for acute change except as noted in the HPI.  Allergies  Review of patient's allergies indicates no known  allergies.  Home Medications   Current Outpatient Rx  Name Route Sig Dispense Refill  . ASPIRIN EC 81 MG PO TBEC Oral Take 81 mg by mouth daily.      . INSULIN GLARGINE 100 UNIT/ML [place] SOLN Subcutaneous Inject 28 Units into the skin at bedtime.      Marland Kitchen METOPROLOL TARTRATE 25 MG PO TABS Oral Take 12.5 mg by mouth 2 (two) times daily.      Carma Leaven M PLUS PO TABS Oral Take 1 tablet by mouth daily.      Marland Kitchen SAXAGLIPTIN HCL 2.5 MG PO TABS Oral Take 5 mg by mouth daily.        BP 109/79  Pulse 81  Temp(Src) 98.3 F (36.8 C) (Oral)  Resp 16  Ht 5\' 9"  (1.753 m)  Wt 114 lb (51.71 kg)  BMI 16.83 kg/m2  SpO2 100%  Physical Exam  Nursing note and vitals reviewed. Constitutional: He is oriented to person, place, and time. He appears well-developed and well-nourished.  HENT:  Head: Normocephalic and atraumatic.  Eyes: Conjunctivae are normal. No scleral icterus.  Neck: Neck supple.  Cardiovascular: Normal rate and regular rhythm.   Pulmonary/Chest: Effort normal. No accessory muscle usage. No respiratory distress.  Abdominal: Soft. There is no tenderness.  Genitourinary:       Large right inguinal hernia.   Musculoskeletal: Normal range of motion.  Neurological: He is  alert and oriented to person, place, and time.  Skin: Skin is warm and dry.    Procedures (including critical care time)  OTHER DATA REVIEWED: Nursing notes, vital signs, and past medical records reviewed.  DIAGNOSTIC STUDIES: Oxygen Saturation is 100% on room air, normal by my interpretation.    LABS / RADIOLOGY:  Labs Reviewed  URINALYSIS, ROUTINE W REFLEX MICROSCOPIC - Abnormal; Notable for the following:    Glucose, UA >1000 (*)    Hgb urine dipstick TRACE (*)    All other components within normal limits  URINE MICROSCOPIC-ADD ON - Abnormal; Notable for the following:    Squamous Epithelial / LPF FEW (*)    All other components within normal limits  CBC - Abnormal; Notable for the following:    Hemoglobin  12.4 (*)    HCT 37.0 (*)    MCV 77.1 (*)    MCH 25.8 (*)    All other components within normal limits  BASIC METABOLIC PANEL - Abnormal; Notable for the following:    Sodium 131 (*)    Chloride 92 (*)    Glucose, Bld 389 (*)    All other components within normal limits  GLUCOSE, CAPILLARY - Abnormal; Notable for the following:    Glucose-Capillary 383 (*)    All other components within normal limits  GLUCOSE, CAPILLARY - Abnormal; Notable for the following:    Glucose-Capillary 343 (*)    All other components within normal limits  DIFFERENTIAL  URINE RAPID DRUG SCREEN (HOSP PERFORMED)   ED COURSE / COORDINATION OF CARE: 17:41 EDP at PT bedside. Reviewed lab results from [facility].  19:20 Consult to Internal Medicine ordered. 19:33: EDP consult with Internal Medicine.   MDM:   SCRIBE ATTESTATION: I personally performed the services described in this documentation, which was scribed in my presence. The recorded information has been reviewed and considered. Joelyn Oms, MD 12/20/10 [phone]

## 2010-12-20 ENCOUNTER — Other Ambulatory Visit (HOSPITAL_COMMUNITY): Payer: Medicare Other

## 2010-12-20 LAB — CBC
HCT: 36.3 % — ABNORMAL LOW (ref 39.0–52.0)
Hemoglobin: 12 g/dL — ABNORMAL LOW (ref 13.0–17.0)
MCV: 77.6 fL — ABNORMAL LOW (ref 78.0–100.0)
RBC: 4.68 MIL/uL (ref 4.22–5.81)
WBC: 6.4 10*3/uL (ref 4.0–10.5)

## 2010-12-20 LAB — COMPREHENSIVE METABOLIC PANEL
Albumin: 2.9 g/dL — ABNORMAL LOW (ref 3.5–5.2)
BUN: 6 mg/dL (ref 6–23)
Creatinine, Ser: 0.48 mg/dL — ABNORMAL LOW (ref 0.50–1.35)
Total Protein: 6.7 g/dL (ref 6.0–8.3)

## 2010-12-20 LAB — MRSA PCR SCREENING: MRSA by PCR: NEGATIVE

## 2010-12-20 LAB — GLUCOSE, CAPILLARY: Glucose-Capillary: 405 mg/dL — ABNORMAL HIGH (ref 70–99)

## 2010-12-20 LAB — HEMOGLOBIN A1C: Hgb A1c MFr Bld: 18.1 % — ABNORMAL HIGH (ref <5.7)

## 2010-12-20 MED ORDER — SODIUM CHLORIDE 0.9 % IN NEBU
INHALATION_SOLUTION | RESPIRATORY_TRACT | Status: AC
  Administered 2010-12-20: 3 mL
  Filled 2010-12-20: qty 3

## 2010-12-20 MED ORDER — INSULIN ASPART 100 UNIT/ML ~~LOC~~ SOLN
20.0000 [IU] | Freq: Once | SUBCUTANEOUS | Status: AC
  Administered 2010-12-20: 20 [IU] via SUBCUTANEOUS

## 2010-12-20 MED ORDER — POTASSIUM CHLORIDE CRYS ER 20 MEQ PO TBCR
40.0000 meq | EXTENDED_RELEASE_TABLET | Freq: Once | ORAL | Status: AC
  Administered 2010-12-20: 40 meq via ORAL
  Filled 2010-12-20: qty 2

## 2010-12-20 NOTE — Consult Note (Signed)
Reason for Consult: Right inguinal hernia Referring Physician: [facility]  [name] is an 60 y.o. male.  HPI: Patient is a 60 year old black male who presented to [facility] with worsening abdominal pain. He was the hospital for uncontrolled diabetes mellitus. He also gives a history of a nonspecific seizure disorder. This is being worked up. He was noted on physical examination to have a reducible right inguinal hernia. The patient is a poor historian. He denies any nausea or vomiting. He denies any pain in the right groin region. Patient lives in a group home.  Past Medical History  Diagnosis Date  . Diabetes mellitus   . Liver disease   . Enlarged prostate   . Hernia   . Malignant neoplasm of liver   . Alcohol dependence   . Seizures     Past Surgical History  Procedure Date  . Appendectomy     Family History  Problem Relation Age of Onset  . Diabetes Mother   . Hypertension Mother   . Cancer Father   . Stomach cancer Father   . Cancer Sister   . Hypertension Other     Social History:  reports that he has been smoking Cigarettes.  He has a 60 pack-year smoking history. He has never used smokeless tobacco. He reports that he drinks alcohol. He reports that he does not use illicit drugs.  Allergies: No Known Allergies  Medications: I have reviewed the patient's current medications.  Results for orders placed during the hospital encounter of 12/19/10 (from the past 48 hour(s))  URINALYSIS, ROUTINE W REFLEX MICROSCOPIC     Status: Abnormal   Collection Time   12/19/10  5:01 PM      Component Value Range Comment   Color, Urine YELLOW  YELLOW     Appearance CLEAR  CLEAR     Specific Gravity, Urine 1.010  1.005 - 1.030     pH 6.0  5.0 - 8.0     Glucose, UA >1000 (*) NEGATIVE (mg/dL)    Hgb urine dipstick TRACE (*) NEGATIVE     Bilirubin Urine NEGATIVE  NEGATIVE     Ketones, ur NEGATIVE  NEGATIVE (mg/dL)    Protein, ur NEGATIVE  NEGATIVE (mg/dL)      Urobilinogen, UA 0.2  0.0 - 1.0 (mg/dL)    Nitrite NEGATIVE  NEGATIVE     Leukocytes, UA NEGATIVE  NEGATIVE    URINE MICROSCOPIC-ADD ON     Status: Abnormal   Collection Time   12/19/10  5:01 PM      Component Value Range Comment   Squamous Epithelial / LPF FEW (*) RARE     WBC, UA 0-2  <3 (WBC/hpf)    RBC / HPF 0-2  <3 (RBC/hpf)    Bacteria, UA RARE  RARE     Urine-Other FEW YEAST     URINE RAPID DRUG SCREEN (HOSP PERFORMED)     Status: Normal   Collection Time   12/19/10  5:58 PM      Component Value Range Comment   Opiates NONE DETECTED  NONE DETECTED     Cocaine NONE DETECTED  NONE DETECTED     Benzodiazepines NONE DETECTED  NONE DETECTED     Amphetamines NONE DETECTED  NONE DETECTED     Tetrahydrocannabinol NONE DETECTED  NONE DETECTED     Barbiturates NONE DETECTED  NONE DETECTED    GLUCOSE, CAPILLARY     Status: Abnormal   Collection Time   12/19/10  6:05 PM      Component Value Range Comment   Glucose-Capillary 383 (*) 70 - 99 (mg/dL)    Comment 1 Documented in Chart      Comment 2 Notify RN     CBC     Status: Abnormal   Collection Time   12/19/10  6:10 PM      Component Value Range Comment   WBC 6.6  4.0 - 10.5 (K/uL)    RBC 4.80  4.22 - 5.81 (MIL/uL)    Hemoglobin 12.4 (*) 13.0 - 17.0 (g/dL)    HCT 21.3 (*) 08.6 - 52.0 (%)    MCV 77.1 (*) 78.0 - 100.0 (fL)    MCH 25.8 (*) 26.0 - 34.0 (pg)    MCHC 33.5  30.0 - 36.0 (g/dL)    RDW 57.8  46.9 - 62.9 (%)    Platelets 177  150 - 400 (K/uL)   DIFFERENTIAL     Status: Normal   Collection Time   12/19/10  6:10 PM      Component Value Range Comment   Neutrophils Relative 51  43 - 77 (%)    Neutro Abs 3.4  1.7 - 7.7 (K/uL)    Lymphocytes Relative 40  12 - 46 (%)    Lymphs Abs 2.7  0.7 - 4.0 (K/uL)    Monocytes Relative 7  3 - 12 (%)    Monocytes Absolute 0.5  0.1 - 1.0 (K/uL)    Eosinophils Relative 1  0 - 5 (%)    Eosinophils Absolute 0.1  0.0 - 0.7 (K/uL)    Basophils Relative 1  0 - 1 (%)    Basophils Absolute  0.1  0.0 - 0.1 (K/uL)   BASIC METABOLIC PANEL     Status: Abnormal   Collection Time   12/19/10  6:10 PM      Component Value Range Comment   Sodium 131 (*) 135 - 145 (mEq/L)    Potassium 3.5  3.5 - 5.1 (mEq/L)    Chloride 92 (*) 96 - 112 (mEq/L)    CO2 32  19 - 32 (mEq/L)    Glucose, Bld 389 (*) 70 - 99 (mg/dL)    BUN 6  6 - 23 (mg/dL)    Creatinine, Ser 5.28  0.50 - 1.35 (mg/dL)    Calcium 9.3  8.4 - 10.5 (mg/dL)    GFR calc non Af Amer >90  >90 (mL/min)    GFR calc Af Amer >90  >90 (mL/min)   GLUCOSE, CAPILLARY     Status: Abnormal   Collection Time   12/19/10  6:57 PM      Component Value Range Comment   Glucose-Capillary 343 (*) 70 - 99 (mg/dL)    Comment 1 Documented in Chart      Comment 2 Notify RN     PHENYTOIN LEVEL, TOTAL     Status: Abnormal   Collection Time   12/19/10  8:41 PM      Component Value Range Comment   Phenytoin Lvl <2.5 (*) 10.0 - 20.0 (ug/mL)   COMPREHENSIVE METABOLIC PANEL     Status: Abnormal   Collection Time   12/20/10  5:20 AM      Component Value Range Comment   Sodium 133 (*) 135 - 145 (mEq/L)    Potassium 3.5  3.5 - 5.1 (mEq/L)    Chloride 95 (*) 96 - 112 (mEq/L)    CO2 29  19 - 32 (mEq/L)    Glucose, Bld 389 (*)  70 - 99 (mg/dL)    BUN 6  6 - 23 (mg/dL)    Creatinine, Ser 0.96 (*) 0.50 - 1.35 (mg/dL)    Calcium 8.9  8.4 - 10.5 (mg/dL)    Total Protein 6.7  6.0 - 8.3 (g/dL)    Albumin 2.9 (*) 3.5 - 5.2 (g/dL)    AST 12  0 - 37 (U/L)    ALT 8  0 - 53 (U/L)    Alkaline Phosphatase 127 (*) 39 - 117 (U/L)    Total Bilirubin 0.3  0.3 - 1.2 (mg/dL)    GFR calc non Af Amer >90  >90 (mL/min)    GFR calc Af Amer >90  >90 (mL/min)   CBC     Status: Abnormal   Collection Time   12/20/10  5:20 AM      Component Value Range Comment   WBC 6.4  4.0 - 10.5 (K/uL)    RBC 4.68  4.22 - 5.81 (MIL/uL)    Hemoglobin 12.0 (*) 13.0 - 17.0 (g/dL)    HCT 04.5 (*) 40.9 - 52.0 (%)    MCV 77.6 (*) 78.0 - 100.0 (fL)    MCH 25.6 (*) 26.0 - 34.0 (pg)    MCHC 33.1   30.0 - 36.0 (g/dL)    RDW 81.1  91.4 - 78.2 (%)    Platelets 170  150 - 400 (K/uL)   GLUCOSE, CAPILLARY     Status: Abnormal   Collection Time   12/20/10  7:16 AM      Component Value Range Comment   Glucose-Capillary 333 (*) 70 - 99 (mg/dL)    Comment 1 Notify RN      Comment 2 Documented in Chart       Dg Abd Acute W/chest  12/19/2010  *RADIOLOGY REPORT*  Clinical Data: Mid/lower abdominal pain, groin pain  ACUTE ABDOMEN SERIES (ABDOMEN 2 VIEW & CHEST 1 VIEW)  Comparison: Abdominal CT - 08/19/2009; chest radiograph - 05/23/2010; chest CTA - 01/05/2010  Findings:  Paucity of bowel gas without definite evidence of obstruction.  No pneumoperitoneum, pneumatosis or portal venous gas.  Punctate calcification overlying the right upper abdominal quadrant likely represent the gallstones seen on prior abdominal CT.  Additional calcifications overlying the right mid hemiabdomen likely represent the calcified retroperitoneal lymph nodes seen on prior abdominal CT.  Unchanged cardiac silhouette and mediastinal contours with atherosclerotic calcifications within the aortic arch.  There is persistent elevation of the right hemidiaphragm, and blunting of the right costophrenic angle without definite pleural effusion.  No pneumothorax.  Grossly unchanged right mid lung linear heterogeneous opacities.  No focal airspace opacities   No acute osseous abnormalities.  IMPRESSION: 1.  Paucity of bowel gas without definite evidence of obstruction.  2.  Cholelithiasis.  3.  No acute cardiopulmonary disease.  Original Report Authenticated By: Waynard Reeds, M.D.    ROS: See chart Blood pressure 114/70, pulse 69, temperature 98.1 F (36.7 C), temperature source Oral, resp. rate 20, height 6\' 1"  (1.854 m), weight 52.1 kg (114 lb 13.8 oz), SpO2 95.00%. Physical Exam: Pleasant black male in no acute distress. Lungs: Clear to auscultation with equal breath sounds bilaterally. Heart: Regular rate and rhythm without S3,  S4, murmurs.  Abdomen: Soft, flat, nontender, nondistended. A large easily reducible right inguinal hernias noted. No left inguinal hernias noted. GU: Within normal limits.  Assessment/Plan: Impression: Large right inguinal hernia. Multiple comorbidities including uncontrolled diabetes mellitus and nonspecific seizure disorder. Plan: Given the size of  the hernia, it may be advantageous to fix this while he is in the hospital. Obviously, his blood sugars need to be controlled and his nonspecific seizure disorder dressed. We'll follow with you.  Mosella Kasa A 12/20/2010, 10:40 AM

## 2010-12-20 NOTE — Progress Notes (Signed)
Called Steward Drone Robert's home, patient's cousin, and she was not home.  Left message with her friend to have Steward Drone to call to the floor to speak with the nurse about Mr. Dripps's medications.

## 2010-12-20 NOTE — Progress Notes (Signed)
Mr [name] is cognitvely unable to answer questions regarding his dose for dilantin. I spoke with patient's niece, who agreed to bring in the patient's medicine bottle today.  There is currently an order for pharmacy  To dose patient's dilantin. I  Will make Am nurse aware  Of conversation with family member.

## 2010-12-20 NOTE — Progress Notes (Signed)
Subjective: Patient denies any chest pain no shortness of breath. Patient denies any abdominal pain.  Objective: Vital signs in last 24 hours: Filed Vitals:   12/20/10 0545 12/20/10 0900 12/20/10 1054 12/20/10 1100  BP: 114/70     Pulse: 69  88 90  Temp: 98.1 F (36.7 C)     TempSrc: Oral     Resp: 20     Height:      Weight:      SpO2: 98% 95% 97% 98%    Intake/Output Summary (Last 24 hours) at 12/20/10 1407 Last data filed at 12/20/10 1231  Gross per 24 hour  Intake 2949.67 ml  Output   1125 ml  Net 1824.67 ml    Weight change:   General: Alert, awake, oriented x3, in no acute distress. HEENT: No bruits, no goiter. Heart: Regular rate and rhythm, without murmurs, rubs, gallops. Lungs: Clear to auscultation bilaterally. Abdomen: Soft, nontender, nondistended, positive bowel sounds. Large reducible inguinal right hernia Extremities: No clubbing cyanosis or edema with positive pedal pulses. Neuro: Grossly intact, nonfocal.    Lab Results: Results for orders placed during the hospital encounter of 12/19/10 (from the past 24 hour(s))  URINALYSIS, ROUTINE W REFLEX MICROSCOPIC     Status: Abnormal   Collection Time   12/19/10  5:01 PM      Component Value Range   Color, Urine YELLOW  YELLOW    Appearance CLEAR  CLEAR    Specific Gravity, Urine 1.010  1.005 - 1.030    pH 6.0  5.0 - 8.0    Glucose, UA >1000 (*) NEGATIVE (mg/dL)   Hgb urine dipstick TRACE (*) NEGATIVE    Bilirubin Urine NEGATIVE  NEGATIVE    Ketones, ur NEGATIVE  NEGATIVE (mg/dL)   Protein, ur NEGATIVE  NEGATIVE (mg/dL)   Urobilinogen, UA 0.2  0.0 - 1.0 (mg/dL)   Nitrite NEGATIVE  NEGATIVE    Leukocytes, UA NEGATIVE  NEGATIVE   URINE MICROSCOPIC-ADD ON     Status: Abnormal   Collection Time   12/19/10  5:01 PM      Component Value Range   Squamous Epithelial / LPF FEW (*) RARE    WBC, UA 0-2  <3 (WBC/hpf)   RBC / HPF 0-2  <3 (RBC/hpf)   Bacteria, UA RARE  RARE    Urine-Other FEW YEAST    URINE  RAPID DRUG SCREEN (HOSP PERFORMED)     Status: Normal   Collection Time   12/19/10  5:58 PM      Component Value Range   Opiates NONE DETECTED  NONE DETECTED    Cocaine NONE DETECTED  NONE DETECTED    Benzodiazepines NONE DETECTED  NONE DETECTED    Amphetamines NONE DETECTED  NONE DETECTED    Tetrahydrocannabinol NONE DETECTED  NONE DETECTED    Barbiturates NONE DETECTED  NONE DETECTED   GLUCOSE, CAPILLARY     Status: Abnormal   Collection Time   12/19/10  6:05 PM      Component Value Range   Glucose-Capillary 383 (*) 70 - 99 (mg/dL)   Comment 1 Documented in Chart     Comment 2 Notify RN    CBC     Status: Abnormal   Collection Time   12/19/10  6:10 PM      Component Value Range   WBC 6.6  4.0 - 10.5 (K/uL)   RBC 4.80  4.22 - 5.81 (MIL/uL)   Hemoglobin 12.4 (*) 13.0 - 17.0 (g/dL)   HCT 16.1 (*)  39.0 - 52.0 (%)   MCV 77.1 (*) 78.0 - 100.0 (fL)   MCH 25.8 (*) 26.0 - 34.0 (pg)   MCHC 33.5  30.0 - 36.0 (g/dL)   RDW 96.0  45.4 - 09.8 (%)   Platelets 177  150 - 400 (K/uL)  DIFFERENTIAL     Status: Normal   Collection Time   12/19/10  6:10 PM      Component Value Range   Neutrophils Relative 51  43 - 77 (%)   Neutro Abs 3.4  1.7 - 7.7 (K/uL)   Lymphocytes Relative 40  12 - 46 (%)   Lymphs Abs 2.7  0.7 - 4.0 (K/uL)   Monocytes Relative 7  3 - 12 (%)   Monocytes Absolute 0.5  0.1 - 1.0 (K/uL)   Eosinophils Relative 1  0 - 5 (%)   Eosinophils Absolute 0.1  0.0 - 0.7 (K/uL)   Basophils Relative 1  0 - 1 (%)   Basophils Absolute 0.1  0.0 - 0.1 (K/uL)  BASIC METABOLIC PANEL     Status: Abnormal   Collection Time   12/19/10  6:10 PM      Component Value Range   Sodium 131 (*) 135 - 145 (mEq/L)   Potassium 3.5  3.5 - 5.1 (mEq/L)   Chloride 92 (*) 96 - 112 (mEq/L)   CO2 32  19 - 32 (mEq/L)   Glucose, Bld 389 (*) 70 - 99 (mg/dL)   BUN 6  6 - 23 (mg/dL)   Creatinine, Ser 1.19  0.50 - 1.35 (mg/dL)   Calcium 9.3  8.4 - 14.7 (mg/dL)   GFR calc non Af Amer >90  >90 (mL/min)   GFR calc  Af Amer >90  >90 (mL/min)  GLUCOSE, CAPILLARY     Status: Abnormal   Collection Time   12/19/10  6:57 PM      Component Value Range   Glucose-Capillary 343 (*) 70 - 99 (mg/dL)   Comment 1 Documented in Chart     Comment 2 Notify RN    PHENYTOIN LEVEL, TOTAL     Status: Abnormal   Collection Time   12/19/10  8:41 PM      Component Value Range   Phenytoin Lvl <2.5 (*) 10.0 - 20.0 (ug/mL)  COMPREHENSIVE METABOLIC PANEL     Status: Abnormal   Collection Time   12/20/10  5:20 AM      Component Value Range   Sodium 133 (*) 135 - 145 (mEq/L)   Potassium 3.5  3.5 - 5.1 (mEq/L)   Chloride 95 (*) 96 - 112 (mEq/L)   CO2 29  19 - 32 (mEq/L)   Glucose, Bld 389 (*) 70 - 99 (mg/dL)   BUN 6  6 - 23 (mg/dL)   Creatinine, Ser 8.29 (*) 0.50 - 1.35 (mg/dL)   Calcium 8.9  8.4 - 56.2 (mg/dL)   Total Protein 6.7  6.0 - 8.3 (g/dL)   Albumin 2.9 (*) 3.5 - 5.2 (g/dL)   AST 12  0 - 37 (U/L)   ALT 8  0 - 53 (U/L)   Alkaline Phosphatase 127 (*) 39 - 117 (U/L)   Total Bilirubin 0.3  0.3 - 1.2 (mg/dL)   GFR calc non Af Amer >90  >90 (mL/min)   GFR calc Af Amer >90  >90 (mL/min)  CBC     Status: Abnormal   Collection Time   12/20/10  5:20 AM      Component Value Range   WBC 6.4  4.0 - 10.5 (K/uL)   RBC 4.68  4.22 - 5.81 (MIL/uL)   Hemoglobin 12.0 (*) 13.0 - 17.0 (g/dL)   HCT 16.1 (*) 09.6 - 52.0 (%)   MCV 77.6 (*) 78.0 - 100.0 (fL)   MCH 25.6 (*) 26.0 - 34.0 (pg)   MCHC 33.1  30.0 - 36.0 (g/dL)   RDW 04.5  40.9 - 81.1 (%)   Platelets 170  150 - 400 (K/uL)  GLUCOSE, CAPILLARY     Status: Abnormal   Collection Time   12/20/10  7:16 AM      Component Value Range   Glucose-Capillary 333 (*) 70 - 99 (mg/dL)   Comment 1 Notify RN     Comment 2 Documented in Chart    MRSA PCR SCREENING     Status: Normal   Collection Time   12/20/10  8:30 AM      Component Value Range   MRSA by PCR NEGATIVE  NEGATIVE   GLUCOSE, CAPILLARY     Status: Abnormal   Collection Time   12/20/10 11:45 AM      Component Value  Range   Glucose-Capillary 405 (*) 70 - 99 (mg/dL)   Comment 1 Notify RN     Comment 2 Documented in Chart       Micro: Recent Results (from the past 240 hour(s))  MRSA PCR SCREENING     Status: Normal   Collection Time   12/20/10  8:30 AM      Component Value Range Status Comment   MRSA by PCR NEGATIVE  NEGATIVE  Final     Studies/Results: Dg Abd Acute W/chest  12/19/2010  *RADIOLOGY REPORT*  Clinical Data: Mid/lower abdominal pain, groin pain  ACUTE ABDOMEN SERIES (ABDOMEN 2 VIEW & CHEST 1 VIEW)  Comparison: Abdominal CT - 08/19/2009; chest radiograph - 05/23/2010; chest CTA - 01/05/2010  Findings:  Paucity of bowel gas without definite evidence of obstruction.  No pneumoperitoneum, pneumatosis or portal venous gas.  Punctate calcification overlying the right upper abdominal quadrant likely represent the gallstones seen on prior abdominal CT.  Additional calcifications overlying the right mid hemiabdomen likely represent the calcified retroperitoneal lymph nodes seen on prior abdominal CT.  Unchanged cardiac silhouette and mediastinal contours with atherosclerotic calcifications within the aortic arch.  There is persistent elevation of the right hemidiaphragm, and blunting of the right costophrenic angle without definite pleural effusion.  No pneumothorax.  Grossly unchanged right mid lung linear heterogeneous opacities.  No focal airspace opacities   No acute osseous abnormalities.  IMPRESSION: 1.  Paucity of bowel gas without definite evidence of obstruction.  2.  Cholelithiasis.  3.  No acute cardiopulmonary disease.  Original Report Authenticated By: Waynard Reeds, M.D.    Medications:     . albuterol  2.5 mg Nebulization Q6H  . aspirin EC  81 mg Oral Daily  . insulin aspart  0-15 Units Subcutaneous TID WC  . insulin aspart  0-5 Units Subcutaneous QHS  . insulin aspart  10 Units Subcutaneous Once  . insulin aspart  20 Units Subcutaneous Once  . insulin aspart  3 Units Subcutaneous  TID WC  . insulin glargine  34 Units Subcutaneous QHS  . linagliptin  5 mg Oral Daily  . metoprolol tartrate  12.5 mg Oral BID  . multivitamins ther. w/minerals  1 tablet Oral Daily  . nicotine  14 mg Transdermal Daily  . sodium chloride  1,000 mL Intravenous Once     Assessment/plan Principal Problem:  *  DM (diabetes mellitus), type 2, uncontrolled Active Problems:  Inguinal hernia recurrent unilateral  Seizure disorder  HTN (hypertension)  #1 uncontrolled diabetes, type II:  CBGs are in the 300-400 range. HGb A1c is pending.  Continue current dose of Lantus. Change sliding scale insulin to resistant scale. Continue meal coverage. Diabetes  education is pending.     #2 inguinal hernia. Right-sided:  acute abdominal series is negative for bowel obstruction or incarceration. Hernia is easily reducible. Patient has been seen by Dr. Lovell Sheehan of general surgery, and recommendations are to fix his hernia during this hospitalization once his sugars have been controlled. General surgery is following and I appreciate the input and recommendations..  #3 questionable history of seizure disorder:  Dilantin level is pending. Awaiting med rec to be completed to resume his home regimen of Dilantin. EEG is pending. Continue seizure precautions.    #4 history of hypertension: Blood pressure stable. Continue with current medications.  #5 mild hyponatremia: This is probably from hyperglycemia. Improved. Follow.   DVT, prophylaxis with provided. Physical therapy and occupational therapy pending. 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  Patient is a full code.        LOS: 1 day   [name] 12/20/2010, 2:07 PM

## 2010-12-20 NOTE — Progress Notes (Signed)
Physical Therapy Evaluation Patient Name: [name] Date: 12/20/2010 Charges: 1 EV Time: 10:52-11:32 Problem List:  Patient Active Problem List  Diagnoses  . DM (diabetes mellitus), type 2, uncontrolled  . Inguinal hernia recurrent unilateral  . Seizure disorder  . HTN (hypertension)   Past Medical History:  Past Medical History  Diagnosis Date  . Diabetes mellitus   . Liver disease   . Enlarged prostate   . Hernia   . Malignant neoplasm of liver   . Alcohol dependence   . Seizures    Past Surgical History:  Past Surgical History  Procedure Date  . Appendectomy     Precautions/Restrictions    Prior Functioning  Home Living Type of Home: House Lives With: Family (sister) Receives Help From: Family (at times family helps) Home Layout: One level Home Access: Stairs to enter Entrance Stairs-Rails: None Entrance Stairs-Number of Steps: 1 Bathroom Shower/Tub:  (Doesn't have running water uses a bath bason) Home Adaptive Equipment: None Additional Comments: no outhouse, uses the bathroom in the woods per patient.   Prior Function Level of Independence: Independent with basic ADLs Able to Take Stairs?: Yes Driving: No Vocation: Other (comment) ("social security") Cognition Cognition Orientation Level: Oriented to place;Oriented to situation;Disoriented to time Sensation/Coordination   Extremity Assessment RLE Assessment RLE Assessment: Exceptions to [facility] RLE Strength RLE Overall Strength: Deficits RLE Overall Strength Comments: 4/5 LLE Assessment LLE Assessment: Exceptions to [facility] LLE Strength LLE Overall Strength: Deficits LLE Overall Strength Comments: 4/5 Mobility (including Balance) Bed Mobility Bed Mobility: Yes Supine to Sit: 6: Modified independent (Device/Increase time) Sitting - Scoot to Edge of Bed: 7: Independent Transfers Transfers: Yes Sit to Stand: 5: Supervision;From bed;From chair/3-in-1;From toilet Sit to Stand Details  (indicate cue type and reason): supervision for safety Stand to Sit: 5: Supervision;To chair/3-in-1;To toilet Stand to Sit Details: supervision for safety Ambulation/Gait Ambulation/Gait: Yes Ambulation/Gait Assistance: 4: Min assist Ambulation/Gait Assistance Details (indicate cue type and reason): min assist for the occational LOB and obstacle negotiation.  Patient has decreased vision in his left eye making him at high rist for falls and running into obstacles on his left.   Ambulation Distance (Feet): 25 Feet Assistive device: None Gait Pattern: Shuffle    Exercise     End of Session PT - End of Session Activity Tolerance: Patient tolerated treatment well Patient left: in chair;with call bell in reach;Other (comment) (chair alarm in place.  ) General Behavior During Session: [facility] for tasks performed PT Assessment/Plan/Recommendation PT Assessment Clinical Impression Statement: 60 y.o. male presents with inguinal hernia.  He presents with decreased balance, safety, and mobility.  He would benefit from skilled PT to maximize independence, functional mobility and safety so that he may return home after extensive physical therapy.   PT Recommendation/Assessment: Patient will need skilled PT in the acute care venue PT Problem List: Decreased strength;Decreased activity tolerance;Decreased balance;Decreased mobility;Decreased coordination;Decreased knowledge of use of DME;Decreased safety awareness;Other (comment) (impaired vision left eye.  ) Barriers to Discharge: Decreased caregiver support Barriers to Discharge Comments: he will need 24 hour supervision to be safe at discharge.   PT Therapy Diagnosis : Difficulty walking;Abnormality of gait;Generalized weakness PT Plan PT Frequency: Min 3X/week PT Treatment/Interventions: DME instruction;Gait training;Stair training;Functional mobility training;Therapeutic exercise;Balance training;Neuromuscular re-education PT Recommendation Follow  Up Recommendations: Home health PT;24 hour supervision/assistance;Other (comment) (if no 24 hour assistance, then patient would need SNF) Equipment Recommended: Other (comment) (to be determined.  ) PT Goals  Acute Rehab PT Goals PT Goal  Formulation: With patient Time For Goal Achievement: 2 weeks Pt will go Supine/Side to Sit: Independently Pt will go Sit to Supine/Side: Independently Pt will Transfer Sit to Stand/Stand to Sit: Independently Pt will Transfer Bed to Chair/Chair to Bed: Independently Pt will Ambulate: 51 - 150 feet;Independently (independently negotiating obstacles on his left side.  ) Pt will Go Up / Down Stairs: 3-5 stairs;with modified independence;with least restrictive assistive device Rachyl Wuebker, Claretta Fraise 12/20/2010, 4:44 PM

## 2010-12-20 NOTE — Plan of Care (Signed)
Problem: Phase II Progression Outcomes Goal: Progress activity as tolerated unless otherwise ordered Outcome: Completed/Met Date Met:  12/20/10 See PT evaluation and progress notes for details.    Goal: Discharge plan established Outcome: Progressing HHPT with 24 hour supervision, if no 24 hour supervision, then patient would be SNF appropriate.

## 2010-12-21 ENCOUNTER — Ambulatory Visit (HOSPITAL_COMMUNITY)
Admission: RE | Admit: 2010-12-21 | Discharge: 2010-12-21 | Disposition: A | Discharge status: Home / Self Care | Payer: Medicare Other | Source: Ambulatory Visit | Attending: Internal Medicine | Admitting: Internal Medicine

## 2010-12-21 DIAGNOSIS — R569 Unspecified convulsions: Secondary | ICD-10-CM | POA: Insufficient documentation

## 2010-12-21 DIAGNOSIS — Z1389 Encounter for screening for other disorder: Secondary | ICD-10-CM | POA: Insufficient documentation

## 2010-12-21 LAB — GLUCOSE, CAPILLARY: Glucose-Capillary: 279 mg/dL — ABNORMAL HIGH (ref 70–99)

## 2010-12-21 LAB — CBC
HCT: 36.6 % — ABNORMAL LOW (ref 39.0–52.0)
MCV: 77.5 fL — ABNORMAL LOW (ref 78.0–100.0)
RDW: 14.2 % (ref 11.5–15.5)
WBC: 6.3 10*3/uL (ref 4.0–10.5)

## 2010-12-21 LAB — BASIC METABOLIC PANEL
BUN: 9 mg/dL (ref 6–23)
Chloride: 92 mEq/L — ABNORMAL LOW (ref 96–112)
Creatinine, Ser: 0.57 mg/dL (ref 0.50–1.35)
GFR calc Af Amer: >90 mL/min (ref >90)

## 2010-12-21 MED ORDER — INSULIN GLARGINE 100 UNIT/ML ~~LOC~~ SOLN
40.0000 [IU] | Freq: Every day | SUBCUTANEOUS | Status: DC
  Administered 2010-12-21: 40 [IU] via SUBCUTANEOUS

## 2010-12-21 NOTE — Plan of Care (Signed)
Problem: Consults Goal: Diabetes Guidelines if Diabetic/Glucose > 140 If diabetic or lab glucose is > 140 mg/dl - Initiate Diabetes/Hyperglycemia Guidelines & Document Interventions  Outcome: Progressing Path reviewed and patient assessed.  Gave patient outpatient diabetes class information.  Recommendations made.

## 2010-12-21 NOTE — Progress Notes (Signed)
Inpatient Diabetes Program Recommendations  AACE/ADA: New Consensus Statement on Inpatient Glycemic Control (2009)  Target Ranges:  Prepandial:   less than 140 mg/dL      Peak postprandial:   less than 180 mg/dL (1-2 hours)      Critically ill patients:  140 - 180 mg/dL   Reason for Visit: Consult for poorly controlled Diabetes  Inpatient Diabetes Program Recommendations Insulin - Basal: Increase Lantus to 40 units qhs Insulin - Meal Coverage: Increase meal coverage to 5 units TID Outpatient Referral: Gave patient information about free Outpatient Diabetes Classes Diet: Consider RD consult  Note: Spoke with patient at length concerning his blood glucose.  Patient stated that he takes his insulin every night, but usually takes all of his pills at the same time each day.    Patient would possibly benefit from 70/30 insulin.  Patient willing to see endocrinologist and states his niece could bring him if needed.   Consider consulting Dr. Fransico Him due to elevated HgbA1c of 18.1%

## 2010-12-21 NOTE — Progress Notes (Signed)
Subjective: Interim history the patient still has blood sugars in the high 300s. However it's down approximately 50 points from yesterday. No active complaints. He however is unable to give any real details about his home medication regimen patient states that he lives with his 2 sisters and his niece. He states that his niece helps administer his medications however she works usually between 3 and 11. There is some question as to whether the patient is getting his medications are regular basis at home Objective: Filed Vitals:   12/21/10 0442 12/21/10 0755 12/21/10 1414 12/21/10 1500  BP: 118/69   99/62  Pulse: 76   71  Temp: 97.9 F (36.6 C)   98.3 F (36.8 C)  TempSrc: Oral     Resp: 18   16  Height:      Weight:      SpO2: 100% 97% 97% 99%   Weight change:   Intake/Output Summary (Last 24 hours) at 12/21/10 1703 Last data filed at 12/21/10 1500  Gross per 24 hour  Intake   1740 ml  Output   2400 ml  Net   -660 ml    General: Alert, awake, oriented x3, in no acute distress.  HEENT: [place]/AT PEERL, EOMI patient has esotropia of the left eye  Neck: Trachea midline,  no masses, no thyromegal,y no JVD, no carotid bruit OROPHARYNX:  Moist, No exudate/ erythema/lesions.  Heart: Regular rate and rhythm, without murmurs, rubs, gallops, PMI non-displaced, no heaves or thrills on palpation.  Lungs: Clear to auscultation, no wheezing or rhonchi noted. No increased vocal fremitus resonant to percussion  Abdomen: Soft, nontender, nondistended, positive bowel sounds, no masses no hepatosplenomegaly noted..  Neuro: No focal neurological deficits noted cranial nerves II through XII grossly intact. DTRs 2+ bilaterally upper and lower extremities. Strength 5 out of 5 in bilateral upper and lower extremities. Musculoskeletal: No warm swelling or erythema around joints, no spinal tenderness noted. Psychiatric: Patient alert and oriented x3, good insight and cognition, good recent to remote  recall. Lymph node survey: No cervical axillary or inguinal lymphadenopathy noted.     Lab Results:  [facility] 12/21/10 0553 12/20/10 0520  NA 132* 133*  K 3.3* 3.5  CL 92* 95*  CO2 32 29  GLUCOSE 240* 389*  BUN 9 6  CREATININE 0.57 0.48*  CALCIUM 9.2 8.9  MG -- --  PHOS -- --    Basename 12/20/10 0520  AST 12  ALT 8  ALKPHOS 127*  BILITOT 0.3  PROT 6.7  ALBUMIN 2.9*   No results found for this basename: LIPASE:2,AMYLASE:2 in the last 72 hours  Basename 12/21/10 0553 12/20/10 0520 12/19/10 1810  WBC 6.3 6.4 --  NEUTROABS -- -- 3.4  HGB 12.2* 12.0* --  HCT 36.6* 36.3* --  MCV 77.5* 77.6* --  PLT 173 170 --       Basename 12/20/10 0520  HGBA1C 18.1*       Micro Results: Recent Results (from the past 240 hour(s))  MRSA PCR SCREENING     Status: Normal   Collection Time   12/20/10  8:30 AM      Component Value Range Status Comment   MRSA by PCR NEGATIVE  NEGATIVE  Final     Studies/Results: Dg Abd Acute W/chest  12/19/2010  *RADIOLOGY REPORT*  Clinical Data: Mid/lower abdominal pain, groin pain  ACUTE ABDOMEN SERIES (ABDOMEN 2 VIEW & CHEST 1 VIEW)  Comparison: Abdominal CT - 08/19/2009; chest radiograph - 05/23/2010; chest CTA - 01/05/2010  Findings:  Paucity of bowel gas without definite evidence of obstruction.  No pneumoperitoneum, pneumatosis or portal venous gas.  Punctate calcification overlying the right upper abdominal quadrant likely represent the gallstones seen on prior abdominal CT.  Additional calcifications overlying the right mid hemiabdomen likely represent the calcified retroperitoneal lymph nodes seen on prior abdominal CT.  Unchanged cardiac silhouette and mediastinal contours with atherosclerotic calcifications within the aortic arch.  There is persistent elevation of the right hemidiaphragm, and blunting of the right costophrenic angle without definite pleural effusion.  No pneumothorax.  Grossly unchanged right mid lung linear heterogeneous  opacities.  No focal airspace opacities   No acute osseous abnormalities.  IMPRESSION: 1.  Paucity of bowel gas without definite evidence of obstruction.  2.  Cholelithiasis.  3.  No acute cardiopulmonary disease.  Original Report Authenticated By: Waynard Reeds, M.D.    Medications: I have reviewed the patient's current medications. Scheduled Meds:   . albuterol  2.5 mg Nebulization Q6H  . aspirin EC  81 mg Oral Daily  . insulin aspart  0-15 Units Subcutaneous TID WC  . insulin aspart  0-5 Units Subcutaneous QHS  . insulin aspart  3 Units Subcutaneous TID WC  . insulin glargine  34 Units Subcutaneous QHS  . linagliptin  5 mg Oral Daily  . metoprolol tartrate  12.5 mg Oral BID  . multivitamins ther. w/minerals  1 tablet Oral Daily  . nicotine  14 mg Transdermal Daily  . sodium chloride       Continuous Infusions:  PRN Meds:.acetaminophen, acetaminophen, ondansetron (ZOFRAN) IV, ondansetron Assessment/Plan: Patient Active Hospital Problem List: DM (diabetes mellitus), type 2, uncontrolled (12/19/2010)   Assessment: Pressure is still poorly controlled however at [facility] they can be managed at home. However particularly this gentleman is a question as to how well he's receiving his medications at home. I've asked that his niece who lives with him he presents tomorrow morning to insure that she understands the regimen that is appropriate for this gentleman. I also spoke with social work and asked that home health be sent in to the home and social worker also be sent to the home to assist the family with their needs. Presently the patient shows no signs of hyperosmolar state her DKA.    Plan: The plan is to further adjust his insulins and any oral medications and he likely discharge home tomorrow to  Inguinal hernia recurrent unilateral (12/19/2010)   Assessment: This gentleman has an inguinal hernia for which he was scheduled to receive an elective repair.    Plan: We spoke with Dr. Lovell Sheehan  and the patient will have this done electively as an outpatient once his blood  sugars are under better control  Seizure disorder (12/19/2010)   Assessment: The patient has a history of seizure disorder however has had no  seizures here in the hospital    Plan: Continue current medication  HTN (hypertension) (12/19/2010)   Assessment: Blood pressure well controlled no further intervention   Best practice: Continue SCDs for DVT prophylaxis   LOS: 2 days

## 2010-12-22 LAB — GLUCOSE, CAPILLARY: Glucose-Capillary: 228 mg/dL — ABNORMAL HIGH (ref 70–99)

## 2010-12-22 LAB — LIPID PANEL
Cholesterol: 181 mg/dL (ref 0–200)
LDL Cholesterol: 105 mg/dL — ABNORMAL HIGH (ref 0–99)
Triglycerides: 70 mg/dL (ref <150)
VLDL: 14 mg/dL (ref 0–40)

## 2010-12-22 MED ORDER — LANTUS 100 UNIT/ML ~~LOC~~ SOLN: 40.0000 [IU] | Freq: Every day | SUBCUTANEOUS | Status: DC

## 2010-12-22 MED ORDER — POTASSIUM CHLORIDE ER 10 MEQ PO TBCR: 20.0000 meq | EXTENDED_RELEASE_TABLET | Freq: Every day | ORAL | Status: DC

## 2010-12-22 MED ORDER — POTASSIUM CHLORIDE CRYS ER 20 MEQ PO TBCR: 40.0000 meq | EXTENDED_RELEASE_TABLET | ORAL | Status: DC

## 2010-12-22 MED ORDER — METOPROLOL TARTRATE 12.5 MG HALF TABLET: 12.5000 mg | ORAL_TABLET | Freq: Two times a day (BID) | ORAL | Status: DC

## 2010-12-22 MED ORDER — METOPROLOL TARTRATE 25 MG PO TABS: 12.5000 mg | ORAL_TABLET | Freq: Two times a day (BID) | ORAL | Status: DC

## 2010-12-22 NOTE — Consult Note (Signed)
CSW spoke with MD again regarding pt. Pt's niece plans to meet with MD this afternoon to discuss pt's care.  CSW confirmed with pt that he has no intention of going to ALF and plans to return home.  He is not willing to discuss placement at this time regardless of home situation.  CSW to sign off.   Karn Cassis

## 2010-12-22 NOTE — Discharge Summary (Signed)
[name] MRN: [id number] DOB/AGE: 62-Jun-1952 62 y.o.  Admit date: 12/19/2010 Discharge date: 12/22/2010  Primary Care Physician:  Reynolds Bowl, MD   Discharge Diagnoses:   Patient Active Problem List  Diagnoses  . DM (diabetes mellitus), type 2, uncontrolled  . Inguinal hernia recurrent unilateral  . Seizure disorder  . HTN (hypertension)    DISCHARGE MEDICATION: Current Discharge Medication List    CONTINUE these medications which have CHANGED   Details  LANTUS 100 UNIT/ML injection Inject 40 Units into the skin at bedtime. Qty: 10 mL, Refills: 12    metoprolol tartrate (LOPRESSOR) 12.5 mg TABS Take 0.5 tablets (12.5 mg total) by mouth 2 (two) times daily. Qty: 30 tablet, Refills: 0      CONTINUE these medications which have NOT CHANGED   Details  aspirin EC 81 MG tablet Take 81 mg by mouth daily.      glipiZIDE (GLUCOTROL) 10 MG tablet Take 10 mg by mouth 2 (two) times daily before a meal.      Multiple Vitamins-Minerals (MULTIVITAMINS THER. W/MINERALS) TABS Take 1 tablet by mouth daily.      saxagliptin HCl (ONGLYZA) 2.5 MG TABS tablet Take 5 mg by mouth daily.            Consults: Treatment Team:  Senaida Ores   SIGNIFICANT DIAGNOSTIC STUDIES:  Dg Abd Acute W/chest  12/19/2010  *RADIOLOGY REPORT*  Clinical Data: Mid/lower abdominal pain, groin pain  ACUTE ABDOMEN SERIES (ABDOMEN 2 VIEW & CHEST 1 VIEW)  Comparison: Abdominal CT - 08/19/2009; chest radiograph - 05/23/2010; chest CTA - 01/05/2010  Findings:  Paucity of bowel gas without definite evidence of obstruction.  No pneumoperitoneum, pneumatosis or portal venous gas.  Punctate calcification overlying the right upper abdominal quadrant likely represent the gallstones seen on prior abdominal CT.  Additional calcifications overlying the right mid hemiabdomen likely represent the calcified retroperitoneal lymph nodes seen on prior abdominal CT.  Unchanged cardiac silhouette and  mediastinal contours with atherosclerotic calcifications within the aortic arch.  There is persistent elevation of the right hemidiaphragm, and blunting of the right costophrenic angle without definite pleural effusion.  No pneumothorax.  Grossly unchanged right mid lung linear heterogeneous opacities.  No focal airspace opacities   No acute osseous abnormalities.  IMPRESSION: 1.  Paucity of bowel gas without definite evidence of obstruction.  2.  Cholelithiasis.  3.  No acute cardiopulmonary disease.  Original Report Authenticated By: Waynard Reeds, M.D.           Recent Results (from the past 240 hour(s))  MRSA PCR SCREENING     Status: Normal   Collection Time   12/20/10  8:30 AM      Component Value Range Status Comment   MRSA by PCR NEGATIVE  NEGATIVE  Final     BRIEF ADMITTING H & P :This is a 60 year old, African American male, with a past medical history of diabetes, who was in his usual state of health earlier today when he went to his physician's office with complaints of pain in his groin. Apparently, this is been going on for the last couple weeks or so. He also mentioned feeling weak. Denies any nausea, vomiting. The pain is not, severe. It's about 3-4 of 10 in intensity. Patient, unfortunately, is a poor historian. He reports a normal bowel movement in the last 2 days. He reports passing gas. He reports having seizure disorder and he tells me that he supposedly takes Dilantin 3 times  a day. However, I am not able to verify this history. The note from his PCP's office does not mention Dilantin. We will need to verify this from his pharmacy. However, he tells me his last seizure was greater than a month ago. He does tell me that he does take Dilantin 3 times a day. In the emergency department his blood sugars was found to be greater than 350. He was given insulin however, his blood sugars are not improving and that's why the hospitalist was called for evaluation.     Hospital  Course:  Present on Admission:  .DM (diabetes mellitus), type 2, uncontrolled: Mr. Loughmiller was referred to the hospital and he was seen in the surgeon's office for a preevaluation for hernia repair. The patient was found to have a markedly elevated blood sugar refer to the hospital during this hospitalization the patient was found to have a hemoglobin A1c of 18.1 narrowing of poorly controlled diabetes type 2 appear the patient had no symptoms of her hyperosmolar state. However there is great concern as to the patient's and his family's ability to manage his diabetes at home effectively appear.the patient does have capacity to determine his post discharge disposition, however I question the patient's ability to understand the consequences of the disease and his insight into his disease process. All l the patient's last admission he was set up for an assisted living facility and on the day of discharge he declined to go to the assisted living facility. Presently with his current regimen of medications his blood sugars are ranging in the 160s. However there is concern as to whether or not the patient will be able to have his blood sugars checked on a 3 times a day basis before me and correction dose insulin administered. In light of this concern the patient is being  Prescribed Glucotrol as he had been taking previously to hospitalization. The patient is being given a prescription for a glucometer so that he can check his blood sugars on a 3 times a day basis. One of his primary caregivers, his niece Shanda Bumps is coming in this afternoon at 1:30 prior to discharge for further instructions.  .Inguinal hernia recurrent unilateral: Dr. Lovell Sheehan saw the patient in consultation here in the hospital. I have discussed this patient with Dr. Lovell Sheehan and he will see the patient in followup as an outpatient for an elective repair of his inguinal hernia.  .Seizure disorder: The patient reports a history of seizure disorder,  however the patient could not verify  Any home medications specific to seizure disorder. The patient had no seizures while hospitalized and the notes from his primary care physician's office did not mention any medications for seizures.  Marland KitchenHTN (hypertension): Blood pressures were well-controlled on current dose of metoprolol while hospitalized. The patient had no signs or symptoms of orthostasis.  Disposition and Follow-up: The patient has been counseled at length about the options for discharge. It is a beautiful health professionals involved in the patient's care that the patient would be best served in the assisted living facility. At this point the patient possesses capacity to determine his discharge disposition and chooses to go back to his prior living arrangement. At this time the social worker is again attempting to persuade the patient to consider assisted living facility. I have spoken with the patient's cousin Steward Drone who appears to be the person most equipped to discuss the patient's home living situation. She agrees that the patient will be best served with in an  assisted living facility and questions the ability of the residence within his home to assist with the management of this disease as an outpatient. Despite all these arguments the patient still wants to be discharged back to his prior living arrangement. Discharge Orders    Future Orders Please Complete By Expires   Ambulatory referral to Home Health      Comments:   Please evaluate [name] for admission to [facility].  Disciplines requested: Nursing, physical therapy, social work, occupational therapy.  Services to provide: Evaluate and treat.  Physician to follow patient's care (the person listed here will be responsible for signing ongoing orders): PCP  Requested Start of Care Date: Tomorrow  Special Instructions:  Please give special attention to patient's diabetic regimen and blood sugars. The patient to check  blood sugars a.c. and at bedtime.   Diet - low sodium heart healthy      Diet Carb Modified      Increase activity slowly         DISCHARGE EXAM:  General: Alert, awake, oriented x3, in no acute distress.  HEENT: [place]/AT PEERL, EOMI patient has esotropia of the left eye  Neck: Trachea midline, no masses, no thyromegal,y no JVD, no carotid bruit  OROPHARYNX: Moist, No exudate/ erythema/lesions.  Heart: Regular rate and rhythm, without murmurs, rubs, gallops, PMI non-displaced, no heaves or thrills on palpation.  Lungs: Clear to auscultation, no wheezing or rhonchi noted. No increased vocal fremitus resonant to percussion  Abdomen: Soft, nontender, nondistended, positive bowel sounds, no masses no hepatosplenomegaly noted..  Neuro: No focal neurological deficits noted cranial nerves II through XII grossly intact. DTRs 2+ bilaterally upper and lower extremities. Strength 5 out of 5 in bilateral upper and lower extremities.  Musculoskeletal: No warm swelling or erythema around joints, no spinal tenderness noted.  Psychiatric: Patient alert and oriented x3, good insight and cognition, good recent to remote recall.  Lymph node survey: No cervical axillary or inguinal lymphadenopathy noted.     Blood pressure 104/69, pulse 75, temperature 98.6 F (37 C), temperature source Oral, resp. rate 16, height 6\' 1"  (1.854 m), weight 52.1 kg (114 lb 13.8 oz), SpO2 95.00%.   Basename 12/21/10 0553 12/20/10 0520  NA 132* 133*  K 3.3* 3.5  CL 92* 95*  CO2 32 29  GLUCOSE 240* 389*  BUN 9 6  CREATININE 0.57 0.48*  CALCIUM 9.2 8.9  MG -- --  PHOS -- --    Basename 12/20/10 0520  AST 12  ALT 8  ALKPHOS 127*  BILITOT 0.3  PROT 6.7  ALBUMIN 2.9*    Basename 12/21/10 0553 12/20/10 0520 12/19/10 1810  WBC 6.3 6.4 --  NEUTROABS -- -- 3.4  HGB 12.2* 12.0* --  HCT 36.6* 36.3* --  MCV 77.5* 77.6* --  PLT 173 170 --    Signed: Mayrin Schmuck A. 12/22/2010, 11:20 AM

## 2010-12-22 NOTE — Consult Note (Signed)
CSW made report to [facility]PS, caseworker Dannette Barbara at request of Dr. Ashley Royalty due to poorly controlled diabetes.  Pt d/c today home with home health. Amy made aware of home situation.   Karn Cassis

## 2010-12-23 NOTE — Procedures (Signed)
NAME:  [name], [name]               ACCOUNT NO.:  [id number]  MEDICAL RECORD NO.:  [id number]  LOCATION:  EE                           FACILITY:  [facility]  PHYSICIAN:  Lameshia Hypolite A. Gerilyn Pilgrim, M.D. DATE OF BIRTH:  1950/06/02  DATE OF PROCEDURE: DATE OF DISCHARGE:  12/21/2010                             EEG INTERPRETATION   HISTORY:  This is a 60 year old man who presents with history of seizures, a complaint of weakness.  MEDICATIONS:  Aspirin, glipizide, metoprolol, insulin.  ANALYSIS:  A 16-channel recording using standard 10/20 measurements is conducted for approximately 20 minutes.  The patient is noted to have very low electrocortical voltage activity.  The background activity gets as high as 11-1/2 Hz in posterior leads, although most of it runs between 9 and 10 Hz.  Photic stimulation is carried out without abnormal changes in the background activity.  There is a brief sleep activity noted with K complexes and sleep spindles observed.  There is no focal or lateralized slowing.  There is no epileptiform activity.  IMPRESSION:  Very low electrocortical voltage background activity, likely a normal variant, otherwise unremarkable study.     Joi Leyva A. Gerilyn Pilgrim, M.D.     KAD/MEDQ  D:  12/23/2010  T:  12/23/2010  Job:  [id number]

## 2010-12-29 LAB — BASIC METABOLIC PANEL
BUN: 4 — ABNORMAL LOW
Chloride: 99
Creatinine, Ser: 0.89
Glucose, Bld: 178 — ABNORMAL HIGH
Potassium: 4.2

## 2010-12-29 LAB — CBC
HCT: 46.2
MCV: 96.3
Platelets: 176
RDW: 16.7 — ABNORMAL HIGH

## 2010-12-29 LAB — DIFFERENTIAL
Basophils Absolute: 0
Basophils Relative: 0
Eosinophils Absolute: 0.1
Eosinophils Relative: 1
Neutrophils Relative %: 86 — ABNORMAL HIGH

## 2010-12-29 LAB — ETHANOL: Alcohol, Ethyl (B): <5

## 2010-12-29 LAB — PHENYTOIN LEVEL, TOTAL: Phenytoin Lvl: <2.5 — ABNORMAL LOW

## 2011-05-07 ENCOUNTER — Encounter (HOSPITAL_COMMUNITY): Payer: Self-pay | Admitting: *Deleted

## 2011-05-07 ENCOUNTER — Other Ambulatory Visit: Payer: Self-pay

## 2011-05-07 ENCOUNTER — Emergency Department (HOSPITAL_COMMUNITY)
Admission: EM | Admit: 2011-05-07 | Discharge: 2011-05-07 | Disposition: A | Discharge status: Home / Self Care | Payer: Medicare Other | Attending: Emergency Medicine | Admitting: Emergency Medicine

## 2011-05-07 DIAGNOSIS — R4182 Altered mental status, unspecified: Secondary | ICD-10-CM | POA: Insufficient documentation

## 2011-05-07 DIAGNOSIS — Z7982 Long term (current) use of aspirin: Secondary | ICD-10-CM | POA: Insufficient documentation

## 2011-05-07 DIAGNOSIS — E162 Hypoglycemia, unspecified: Secondary | ICD-10-CM

## 2011-05-07 DIAGNOSIS — Z794 Long term (current) use of insulin: Secondary | ICD-10-CM | POA: Insufficient documentation

## 2011-05-07 DIAGNOSIS — E1169 Type 2 diabetes mellitus with other specified complication: Secondary | ICD-10-CM | POA: Insufficient documentation

## 2011-05-07 LAB — BASIC METABOLIC PANEL
CO2: 28 mEq/L (ref 19–32)
GFR calc non Af Amer: >90 mL/min (ref >90)
Glucose, Bld: 39 mg/dL — CL (ref 70–99)
Potassium: 3.6 mEq/L (ref 3.5–5.1)
Sodium: 134 mEq/L — ABNORMAL LOW (ref 135–145)

## 2011-05-07 LAB — URINALYSIS, ROUTINE W REFLEX MICROSCOPIC
Leukocytes, UA: NEGATIVE
Protein, ur: NEGATIVE mg/dL
Specific Gravity, Urine: 1.015 (ref 1.005–1.030)
Urobilinogen, UA: 0.2 mg/dL (ref 0.0–1.0)

## 2011-05-07 LAB — GLUCOSE, CAPILLARY
Glucose-Capillary: 28 mg/dL — CL (ref 70–99)
Glucose-Capillary: 56 mg/dL — ABNORMAL LOW (ref 70–99)

## 2011-05-07 LAB — CBC
Platelets: 274 10*3/uL (ref 150–400)
RBC: 4.76 MIL/uL (ref 4.22–5.81)
RDW: 13.4 % (ref 11.5–15.5)
WBC: 7.3 10*3/uL (ref 4.0–10.5)

## 2011-05-07 LAB — DIFFERENTIAL
Basophils Absolute: 0 10*3/uL (ref 0.0–0.1)
Lymphocytes Relative: 28 % (ref 12–46)
Lymphs Abs: 2.1 10*3/uL (ref 0.7–4.0)
Neutrophils Relative %: 63 % (ref 43–77)

## 2011-05-07 LAB — HEPATIC FUNCTION PANEL
ALT: 33 U/L (ref 0–53)
Bilirubin, Direct: <0.1 mg/dL (ref 0.0–0.3)
Total Protein: 7.7 g/dL (ref 6.0–8.3)

## 2011-05-07 LAB — CARDIAC PANEL(CRET KIN+CKTOT+MB+TROPI)
CK, MB: 2.4 ng/mL (ref 0.3–4.0)
Total CK: 92 U/L (ref 7–232)
Troponin I: <0.3 ng/mL (ref <0.30)

## 2011-05-07 LAB — URINE MICROSCOPIC-ADD ON

## 2011-05-07 LAB — AMMONIA: Ammonia: 19 umol/L (ref 11–60)

## 2011-05-07 NOTE — ED Notes (Addendum)
Pt to department via EMS.  Reports "just not acting normal".  Pt alert to person and place, not time.  Per EMS, blood glucose was 63 on scene.  Rechecked in departiemtn, results 28.  Pt did drink 2 containers of orange juice with no difficulty.  IV started.  EDP at bedside.

## 2011-05-07 NOTE — ED Provider Notes (Signed)
History     CSN: [id number]  Arrival date & time 05/07/11  4098   First MD Initiated Contact with Patient 05/07/11 [phone]      Chief Complaint  Patient presents with  . Altered Mental Status     Patient is a 61 y.o. male presenting with altered mental status. The history is provided by the patient and a relative.  Altered Mental Status This is a new problem. Episode onset: last night. The problem occurs constantly. The problem has been gradually worsening. Pertinent negatives include no chest pain, no abdominal pain, no headaches and no shortness of breath. The symptoms are aggravated by nothing. The symptoms are relieved by nothing.  Pt presents from home for altered mental status He presents via EMS with his niece The niece reports that she woke up to him arguing with his other family members and he was combative and police were called.  EMS reported their initial glucose was >60.   Pt currently has no complaints, denies headache/cp/sob/abdominal pain   Past Medical History  Diagnosis Date  . Diabetes mellitus   . Liver disease   . Enlarged prostate   . Hernia   . Malignant neoplasm of liver   . Alcohol dependence   . Seizures     Past Surgical History  Procedure Date  . Appendectomy     Family History  Problem Relation Age of Onset  . Diabetes Mother   . Hypertension Mother   . Cancer Father   . Stomach cancer Father   . Cancer Sister   . Hypertension Other     History  Substance Use Topics  . Smoking status: Current Everyday Smoker -- 1.5 packs/day for 40 years    Types: Cigarettes  . Smokeless tobacco: Never Used  . Alcohol Use: Yes     was alcohol dependent. SAYS HE HASNT HAD ANY IN LAST 1 MONTH      Review of Systems  Respiratory: Negative for shortness of breath.   Cardiovascular: Negative for chest pain.  Gastrointestinal: Negative for abdominal pain.  Neurological: Negative for headaches.  Psychiatric/Behavioral: Positive for altered mental  status.  All other systems reviewed and are negative.    Allergies  Review of patient's allergies indicates no known allergies.  Home Medications   Current Outpatient Rx  Name Route Sig Dispense Refill  . GLIPIZIDE 10 MG PO TABS Oral Take 10 mg by mouth 2 (two) times daily before a meal.      . INSULIN ASPART 100 UNIT/ML [place] SOLN Subcutaneous Inject 9 Units into the skin 3 (three) times daily before meals.    Marland Kitchen LANTUS 100 UNIT/ML [place] SOLN Subcutaneous Inject 40 Units into the skin at bedtime. 10 mL 12    Dispense as written.    Please dispense as Solostar Pen.  . ASPIRIN EC 81 MG PO TBEC Oral Take 81 mg by mouth daily.      Marland Kitchen METOPROLOL TARTRATE 25 MG PO TABS Oral Take 0.5 tablets (12.5 mg total) by mouth 2 (two) times daily. 30 tablet 0  . THERA M PLUS PO TABS Oral Take 1 tablet by mouth daily.      Marland Kitchen POTASSIUM CHLORIDE ER 10 MEQ PO TBCR Oral Take 2 tablets (20 mEq total) by mouth daily. 60 tablet 0  . SAXAGLIPTIN HCL 2.5 MG PO TABS Oral Take 5 mg by mouth daily.       BP 150/85  Pulse 76  Temp 97.6 F (36.4 C)  Resp 20  Wt 120 lb (54.432 kg)  SpO2 100%  Physical Exam CONSTITUTIONAL: Well developed/well nourished HEAD AND FACE: Normocephalic/atraumatic EYES: EOMI ENMT: Mucous membranes moist, deformity of left ear (chronic) NECK: supple no meningeal signs SPINE:entire spine nontender CV: S1/S2 noted, no murmurs/rubs/gallops noted LUNGS: Lungs are clear to auscultation bilaterally, no apparent distress ABDOMEN: soft, nontender, no rebound or guarding GU:no cva tenderness NEURO: Pt is awake/alert, moves all extremitiesx4, no arm/leg drift He is oriented to person/place but not time.  He is cooperative EXTREMITIES:  full ROM SKIN: warm, color normal PSYCH: no abnormalities of mood noted  ED Course  Procedures  Labs Reviewed  GLUCOSE, CAPILLARY - Abnormal; Notable for the following:    Glucose-Capillary 28 (*)    All other components within normal limits  CBC    DIFFERENTIAL  BASIC METABOLIC PANEL  URINALYSIS, ROUTINE W REFLEX MICROSCOPIC  HEPATIC FUNCTION PANEL  AMMONIA   7:08 AM Pt from home for AMS.  Glucose here at 28.  He is awake/alert but is confused on time.  He was given juice for his hypoglycemia.  He reports that he did not take any of his diabetic meds this morning but did take them yesterday.   Labs pending at this time.  Will need to be monitored and recheck glucose over the next 4 hours  Discussed case with dr strand who will take over care of patient  MDM  Nursing notes reviewed and considered in documentation Previous records reviewed and considered    Date: 05/07/2011  Rate: 85  Rhythm: normal sinus rhythm  QRS Axis: normal  Intervals: normal  ST/T Wave abnormalities: nonspecific ST changes  Conduction Disutrbances:none  Narrative Interpretation:   Old EKG Reviewed: unchanged          Joya Gaskins, MD 05/07/11 0710

## 2011-05-07 NOTE — ED Notes (Signed)
CRITICAL VALUE ALERT  Critical value received:  Glucose 39  Date of notification:  05/07/11  Time of notification:  0755  Critical value read back:yes  Nurse who received alert:  Primitivo Gauze  MD notified (1st page):  [phone]  Time of first page:  0756  MD notified (2nd page):  Time of second page:  Responding MD:  Dr Meriel Pica  Time MD responded:  [phone]

## 2011-05-07 NOTE — ED Notes (Signed)
Pt awake and alert at time. Oriented to person, place and time. Pt lying on stretcher with no complaints. Family at bedside.

## 2011-05-07 NOTE — ED Notes (Signed)
Pt remains awake and oriented x 3. Skin warm and dry. Color pink. Waiting for breakfast tray. Given orange juice while waiting for tray.

## 2011-05-07 NOTE — ED Provider Notes (Signed)
Physical Exam  BP 136/92  Pulse 88  Temp 97.6 F (36.4 C)  Resp 16  Wt 120 lb (54.432 kg)  SpO2 99%  Physical Exam HEENT: PERLA EOMI COR: RRR CHEST: Clear NEURO: Awake, alert, answering questions appropriately. ED Course  Procedures Results for orders placed during the hospital encounter of 05/07/11  CBC      Component Value Range   WBC 7.3  4.0 - 10.5 (K/uL)   RBC 4.76  4.22 - 5.81 (MIL/uL)   Hemoglobin 12.4 (*) 13.0 - 17.0 (g/dL)   HCT 96.0 (*) 45.4 - 52.0 (%)   MCV 80.5  78.0 - 100.0 (fL)   MCH 26.1  26.0 - 34.0 (pg)   MCHC 32.4  30.0 - 36.0 (g/dL)   RDW 09.8  11.9 - 14.7 (%)   Platelets 274  150 - 400 (K/uL)  DIFFERENTIAL      Component Value Range   Neutrophils Relative 63  43 - 77 (%)   Neutro Abs 4.6  1.7 - 7.7 (K/uL)   Lymphocytes Relative 28  12 - 46 (%)   Lymphs Abs 2.1  0.7 - 4.0 (K/uL)   Monocytes Relative 8  3 - 12 (%)   Monocytes Absolute 0.6  0.1 - 1.0 (K/uL)   Eosinophils Relative 0  0 - 5 (%)   Eosinophils Absolute 0.0  0.0 - 0.7 (K/uL)   Basophils Relative 0  0 - 1 (%)   Basophils Absolute 0.0  0.0 - 0.1 (K/uL)  BASIC METABOLIC PANEL      Component Value Range   Sodium 134 (*) 135 - 145 (mEq/L)   Potassium 3.6  3.5 - 5.1 (mEq/L)   Chloride 97  96 - 112 (mEq/L)   CO2 28  19 - 32 (mEq/L)   Glucose, Bld 39 (*) 70 - 99 (mg/dL)   BUN 8  6 - 23 (mg/dL)   Creatinine, Ser 8.29  0.50 - 1.35 (mg/dL)   Calcium 9.8  8.4 - 56.2 (mg/dL)   GFR calc non Af Amer >90  >90 (mL/min)   GFR calc Af Amer >90  >90 (mL/min)  URINALYSIS, ROUTINE W REFLEX MICROSCOPIC      Component Value Range   Color, Urine YELLOW  YELLOW    APPearance CLEAR  CLEAR    Specific Gravity, Urine 1.015  1.005 - 1.030    pH 5.5  5.0 - 8.0    Glucose, UA NEGATIVE  NEGATIVE (mg/dL)   Hgb urine dipstick TRACE (*) NEGATIVE    Bilirubin Urine NEGATIVE  NEGATIVE    Ketones, ur NEGATIVE  NEGATIVE (mg/dL)   Protein, ur NEGATIVE  NEGATIVE (mg/dL)   Urobilinogen, UA 0.2  0.0 - 1.0 (mg/dL)   Nitrite NEGATIVE  NEGATIVE    Leukocytes, UA NEGATIVE  NEGATIVE   GLUCOSE, CAPILLARY      Component Value Range   Glucose-Capillary 28 (*) 70 - 99 (mg/dL)  HEPATIC FUNCTION PANEL      Component Value Range   Total Protein 7.7  6.0 - 8.3 (g/dL)   Albumin 3.3 (*) 3.5 - 5.2 (g/dL)   AST 51 (*) 0 - 37 (U/L)   ALT 33  0 - 53 (U/L)   Alkaline Phosphatase 196 (*) 39 - 117 (U/L)   Total Bilirubin 0.2 (*) 0.3 - 1.2 (mg/dL)   Bilirubin, Direct <1.3  0.0 - 0.3 (mg/dL)   Indirect Bilirubin NOT CALCULATED  0.3 - 0.9 (mg/dL)  AMMONIA      Component Value Range  Ammonia 19  11 - 60 (umol/L)  CARDIAC PANEL(CRET KIN+CKTOT+MB+TROPI)      Component Value Range   Total CK 92  7 - 232 (U/L)   CK, MB 2.4  0.3 - 4.0 (ng/mL)   Troponin I <0.30  <0.30 (ng/mL)   Relative Index RELATIVE INDEX IS INVALID  0.0 - 2.5   GLUCOSE, CAPILLARY      Component Value Range   Glucose-Capillary 87  70 - 99 (mg/dL)   Comment 1 Documented in Chart     Comment 2 Notify RN    URINE MICROSCOPIC-ADD ON      Component Value Range   Squamous Epithelial / LPF FEW (*) RARE    WBC, UA 0-2  <3 (WBC/hpf)   RBC / HPF 0-2  <3 (RBC/hpf)   Bacteria, UA RARE  RARE   GLUCOSE, CAPILLARY      Component Value Range   Glucose-Capillary 56 (*) 70 - 99 (mg/dL)   Comment 1 Documented in Chart     Comment 2 Notify RN    GLUCOSE, CAPILLARY      Component Value Range   Glucose-Capillary 106 (*) 70 - 99 (mg/dL)   Comment 1 Documented in Chart     Comment 2 Notify RN    GLUCOSE, CAPILLARY      Component Value Range   Glucose-Capillary 173 (*) 70 - 99 (mg/dL)   Comment 1 Call MD NNP PA CNM     MDM Patient with was found to have low blood sugar at home resulting in combative behavior and confusion.Glucose was 63 in the home. Glucose upon arrival in the ER 28. Given juice. Glucose 56. Given meal x 2. Glucose 106. Patient has remained alert, oriented appropriate. After eating two meal and a snack, patient's glucose is 173. Pt stable in ED  with no significant deterioration in condition. Niece here to take him home.The patient appears reasonably screened and/or stabilized for discharge and I doubt any other medical condition or other [facility] requiring further screening, evaluation, or treatment in the ED at this time prior to discharge.  MDM Reviewed: nursing note and vitals Reviewed previous: labs Interpretation: labs        Nicoletta Dress. Colon Branch, MD 05/07/11 1008

## 2011-05-07 NOTE — ED Notes (Addendum)
EMS reports pts family said pt was acting confused. Pt alert and oriented. CBG 63.

## 2011-05-07 NOTE — ED Notes (Signed)
Pt up to bedside commode to have a BM. Alert and oriented x 3. No complaints.

## 2013-07-20 ENCOUNTER — Emergency Department (HOSPITAL_COMMUNITY)
Admission: EM | Admit: 2013-07-20 | Discharge: 2013-07-21 | Disposition: A | Discharge status: Home / Self Care | Payer: Medicare Other | Attending: Emergency Medicine | Admitting: Emergency Medicine

## 2013-07-20 ENCOUNTER — Encounter (HOSPITAL_COMMUNITY): Payer: Self-pay | Admitting: Emergency Medicine

## 2013-07-20 DIAGNOSIS — E119 Type 2 diabetes mellitus without complications: Secondary | ICD-10-CM | POA: Insufficient documentation

## 2013-07-20 DIAGNOSIS — Z8505 Personal history of malignant neoplasm of liver: Secondary | ICD-10-CM | POA: Insufficient documentation

## 2013-07-20 DIAGNOSIS — M79609 Pain in unspecified limb: Secondary | ICD-10-CM | POA: Insufficient documentation

## 2013-07-20 DIAGNOSIS — Z794 Long term (current) use of insulin: Secondary | ICD-10-CM | POA: Insufficient documentation

## 2013-07-20 DIAGNOSIS — Z79899 Other long term (current) drug therapy: Secondary | ICD-10-CM | POA: Insufficient documentation

## 2013-07-20 DIAGNOSIS — F17200 Nicotine dependence, unspecified, uncomplicated: Secondary | ICD-10-CM | POA: Insufficient documentation

## 2013-07-20 DIAGNOSIS — Z7982 Long term (current) use of aspirin: Secondary | ICD-10-CM | POA: Insufficient documentation

## 2013-07-20 DIAGNOSIS — Z87448 Personal history of other diseases of urinary system: Secondary | ICD-10-CM | POA: Insufficient documentation

## 2013-07-20 DIAGNOSIS — H179 Unspecified corneal scar and opacity: Secondary | ICD-10-CM | POA: Insufficient documentation

## 2013-07-20 DIAGNOSIS — M79662 Pain in left lower leg: Secondary | ICD-10-CM

## 2013-07-20 DIAGNOSIS — Z8719 Personal history of other diseases of the digestive system: Secondary | ICD-10-CM | POA: Insufficient documentation

## 2013-07-20 MED ORDER — ENOXAPARIN SODIUM 60 MG/0.6ML ~~LOC~~ SOLN
1.0000 mg/kg | Freq: Once | SUBCUTANEOUS | Status: AC
  Administered 2013-07-20: 60 mg via SUBCUTANEOUS
  Filled 2013-07-20: qty 0.6

## 2013-07-20 MED ORDER — OXYCODONE-ACETAMINOPHEN 5-325 MG PO TABS
1.0000 | ORAL_TABLET | Freq: Once | ORAL | Status: AC
  Administered 2013-07-20: 1 via ORAL
  Filled 2013-07-20: qty 1

## 2013-07-20 MED ORDER — OXYCODONE-ACETAMINOPHEN 5-325 MG PO TABS: 1.0000 | ORAL_TABLET | ORAL | Status: DC | PRN

## 2013-07-20 NOTE — ED Notes (Signed)
Patient complaining of left calf pain that started last night. Denies injury.

## 2013-07-20 NOTE — ED Provider Notes (Signed)
CSN: [id number]     Arrival date & time 07/20/13  2017 History  This chart was scribed for Jim Fuel, MD by Jenne Campus, ED Scribe. This patient was seen in room APA05/APA05 and the patient's care was started at 11:13 PM.   Chief Complaint  Patient presents with  . Leg Pain     The history is provided by the patient. No language interpreter was used.    HPI Comments: IBN STIEF is a 63 y.o. male who presents to the Emergency Department complaining of constant left leg pain from the knee to the foot with centralized pain around the knee that started last night. The pain is worse with ambulation but denies any improving factors. Pt is unable to rate the pain on a scale from 1 to 10 as he does not understand the question. He reports taking Tylenol with no improvement. He denies any injuries or falls. He denies any other symptoms.   PCP is Dr. Barnet Pall (?) with [facility]. Also has home health care x2 monthly   Past Medical History  Diagnosis Date  . Diabetes mellitus   . Liver disease   . Enlarged prostate   . Hernia   . Malignant neoplasm of liver   . Alcohol dependence   . Seizures    Past Surgical History  Procedure Laterality Date  . Appendectomy     Family History  Problem Relation Age of Onset  . Diabetes Mother   . Hypertension Mother   . Cancer Father   . Stomach cancer Father   . Cancer Sister   . Hypertension Other    History  Substance Use Topics  . Smoking status: Current Every Day Smoker -- 1.50 packs/day for 40 years    Types: Cigarettes  . Smokeless tobacco: Never Used  . Alcohol Use: Yes     Comment: was alcohol dependent. SAYS HE HASNT HAD ANY IN LAST 1 MONTH    Review of Systems  A complete 10 system review of systems was obtained and all systems are negative except as noted in the HPI and PMH.    Allergies  Review of patient's allergies indicates no known allergies.  Home Medications   Prior to Admission medications    Medication Sig Start Date End Date Taking? Authorizing Provider  aspirin EC 81 MG tablet Take 81 mg by mouth daily.     Yes Historical Provider, MD  glipiZIDE (GLUCOTROL) 10 MG tablet Take 10 mg by mouth 2 (two) times daily before a meal.     Yes Historical Provider, MD  insulin aspart (NOVOLOG) 100 UNIT/ML injection Inject 10 Units into the skin 3 (three) times daily before meals.    Yes Historical Provider, MD  LEVEMIR FLEXTOUCH 100 UNIT/ML Pen Inject 40-50 Units into the skin every evening.  06/20/13  Yes Historical Provider, MD  metoprolol tartrate (LOPRESSOR) 25 MG tablet Take 25 mg by mouth every morning. 12/22/10 07/20/13 Yes Leana Gamer, MD  Multiple Vitamins-Minerals (MULTIVITAMINS THER. W/MINERALS) TABS Take 1 tablet by mouth daily.     Yes Historical Provider, MD  polyethylene glycol (MIRALAX / GLYCOLAX) packet Take 17 g by mouth daily.   Yes Historical Provider, MD  potassium chloride (K-DUR) 10 MEQ tablet Take 10 mEq by mouth every morning. 12/22/10 07/20/13 Yes Leana Gamer, MD  saxagliptin HCl (ONGLYZA) 2.5 MG TABS tablet Take 5 mg by mouth daily.    Yes Historical Provider, MD   Triage Vitals: BP 89/57  Pulse 108  Temp(Src) 98.1 F (36.7 C) (Oral)  Resp 20  Ht 5\' 8"  (1.727 m)  SpO2 100%  Physical Exam  Nursing note and vitals reviewed. Constitutional: He is oriented to person, place, and time. He appears well-developed and well-nourished. No distress.  HENT:  Head: Normocephalic and atraumatic.  Eyes:  Left cornea is opacified.   Neck: Neck supple. No tracheal deviation present.  Cardiovascular: Normal rate.   Pulmonary/Chest: Effort normal. No respiratory distress.  Musculoskeletal:  Mild tenderness to the proximal left calf. No swelling, erythema or warmth. Pain elicited on passive ROM. Mild venous stasis changes noted.   Neurological: He is alert and oriented to person, place, and time.  Skin: Skin is warm and dry.  Psychiatric: He has a normal mood and  affect. His behavior is normal.    ED Course  Procedures (including critical care time)  Medications  oxyCODONE-acetaminophen (PERCOCET/ROXICET) 5-325 MG per tablet 1 tablet (not administered)    DIAGNOSTIC STUDIES: Oxygen Saturation is 100% on RA, normal by my interpretation.    COORDINATION OF CARE: 11:15 PM-Discussed treatment plan which includes pain medication and appointment with U/S tomorrow morning with pt and family at bedside and both agreed to plan.   MDM   Final diagnoses:  Pain of left lower leg    Leg pain without obvious cause. No swelling to suggest DVT or ruptured Baker's Cyst. No evidence of vascular insufficiency. Will get venous ultrasound to make sure no DVT - will need to have him come back in AM. He is sent home with a to-go pack ofoxycodone-acetaminophen.   I personally performed the services described in this documentation, which was scribed in my presence. The recorded information has been reviewed and is accurate.      Jim Fuel, MD 93/90/30 0923

## 2013-07-20 NOTE — Discharge Instructions (Signed)
Come back tomorrow for ultrasound to make sure there is not a blood clot.

## 2013-07-21 ENCOUNTER — Ambulatory Visit (HOSPITAL_COMMUNITY)
Admission: RE | Admit: 2013-07-21 | Discharge: 2013-07-21 | Disposition: A | Discharge status: Home / Self Care | Payer: Medicare Other | Source: Ambulatory Visit | Attending: Emergency Medicine | Admitting: Emergency Medicine

## 2013-07-21 ENCOUNTER — Ambulatory Visit (HOSPITAL_COMMUNITY): Payer: Medicare Other

## 2013-07-21 DIAGNOSIS — M79609 Pain in unspecified limb: Secondary | ICD-10-CM | POA: Insufficient documentation

## 2013-07-21 MED FILL — Oxycodone w/ Acetaminophen Tab 5-325 MG: ORAL | Qty: 6 | Status: AC

## 2014-02-15 ENCOUNTER — Emergency Department (HOSPITAL_COMMUNITY)
Admission: EM | Admit: 2014-02-15 | Discharge: 2014-02-15 | Disposition: A | Discharge status: Home / Self Care | Payer: Medicare Other | Attending: Emergency Medicine | Admitting: Emergency Medicine

## 2014-02-15 ENCOUNTER — Encounter (HOSPITAL_COMMUNITY): Payer: Self-pay | Admitting: Emergency Medicine

## 2014-02-15 ENCOUNTER — Emergency Department (HOSPITAL_COMMUNITY): Payer: Medicare Other

## 2014-02-15 DIAGNOSIS — Z794 Long term (current) use of insulin: Secondary | ICD-10-CM | POA: Insufficient documentation

## 2014-02-15 DIAGNOSIS — E119 Type 2 diabetes mellitus without complications: Secondary | ICD-10-CM | POA: Insufficient documentation

## 2014-02-15 DIAGNOSIS — Z9889 Other specified postprocedural states: Secondary | ICD-10-CM | POA: Diagnosis not present

## 2014-02-15 DIAGNOSIS — R519 Headache, unspecified: Secondary | ICD-10-CM

## 2014-02-15 DIAGNOSIS — R51 Headache: Secondary | ICD-10-CM | POA: Insufficient documentation

## 2014-02-15 DIAGNOSIS — Z87438 Personal history of other diseases of male genital organs: Secondary | ICD-10-CM | POA: Insufficient documentation

## 2014-02-15 DIAGNOSIS — Z8505 Personal history of malignant neoplasm of liver: Secondary | ICD-10-CM | POA: Insufficient documentation

## 2014-02-15 DIAGNOSIS — Z7982 Long term (current) use of aspirin: Secondary | ICD-10-CM | POA: Insufficient documentation

## 2014-02-15 DIAGNOSIS — Z79899 Other long term (current) drug therapy: Secondary | ICD-10-CM | POA: Diagnosis not present

## 2014-02-15 DIAGNOSIS — Z720 Tobacco use: Secondary | ICD-10-CM | POA: Diagnosis not present

## 2014-02-15 DIAGNOSIS — Z8719 Personal history of other diseases of the digestive system: Secondary | ICD-10-CM | POA: Diagnosis not present

## 2014-02-15 LAB — CBG MONITORING, ED: Glucose-Capillary: 194 mg/dL — ABNORMAL HIGH (ref 70–99)

## 2014-02-15 MED ORDER — KETOROLAC TROMETHAMINE 30 MG/ML IJ SOLN
30.0000 mg | Freq: Once | INTRAMUSCULAR | Status: AC
  Administered 2014-02-15: 30 mg via INTRAVENOUS

## 2014-02-15 MED ORDER — KETOROLAC TROMETHAMINE 30 MG/ML IJ SOLN
INTRAMUSCULAR | Status: AC
  Filled 2014-02-15: qty 1

## 2014-02-15 MED ORDER — DIPHENHYDRAMINE HCL 50 MG/ML IJ SOLN
50.0000 mg | Freq: Once | INTRAMUSCULAR | Status: AC
  Administered 2014-02-15: 50 mg via INTRAVENOUS
  Filled 2014-02-15: qty 1

## 2014-02-15 MED ORDER — TETRACAINE HCL 0.5 % OP SOLN
2.0000 [drp] | Freq: Once | OPHTHALMIC | Status: AC
  Administered 2014-02-15: 2 [drp] via OPHTHALMIC
  Filled 2014-02-15: qty 2

## 2014-02-15 MED ORDER — METOCLOPRAMIDE HCL 5 MG/ML IJ SOLN
10.0000 mg | Freq: Once | INTRAMUSCULAR | Status: AC
  Administered 2014-02-15: 10 mg via INTRAVENOUS
  Filled 2014-02-15: qty 2

## 2014-02-15 MED ORDER — SODIUM CHLORIDE 0.9 % IV BOLUS (SEPSIS)
1000.0000 mL | Freq: Once | INTRAVENOUS | Status: AC
  Administered 2014-02-15: 1000 mL via INTRAVENOUS

## 2014-02-15 NOTE — Discharge Instructions (Signed)

## 2014-02-15 NOTE — ED Notes (Signed)
Patient c/o intermittent headache since September. Per family patient had cataract surgery in August and when he was seen again by optamilogist and "eye drop administered and since then he has had these headache. Denies any nausea or vomiting but reports slight dizziness.

## 2014-02-15 NOTE — ED Provider Notes (Signed)
TIME SEEN: This chart was scribed for [place], DO by Hilda Lias, ED Scribe. This patient was seen in room APA12/APA12 and the patient's care was started at 8:16 PM.   CHIEF COMPLAINT: Headache  HPI:  [name] is a 63 y.o. male with history of diabetes, alcohol abuse who presents to the Emergency Department complaining of an intermittent headache that began in September. Pt had cataract surgery in right eye in August with the [facility] in [place]. Pt has been taking Excedrin Migraine at home with little relief. Pt states that his vision in his right eye is better since surgery. His relative states that she decided to bring him into the ED because of the constant complaints about his headache. Pt denies having any sort of head injury recently. Pt denies tingling or numbness, no focal weakness, and denies fever. She states she has seen her primary care physician for this, Dr. Linton Rump.    ROS: See HPI Constitutional: no fever  Eyes: no drainage  ENT: no runny nose   Cardiovascular:  no chest pain  Resp: no SOB  GI: no vomiting GU: no dysuria Integumentary: no rash  Allergy: no hives  Musculoskeletal: no leg swelling  Neurological: no slurred speech; headache; no tingling, no numbness ROS otherwise negative  PAST MEDICAL HISTORY/PAST SURGICAL HISTORY:  Past Medical History  Diagnosis Date  . Diabetes mellitus   . Liver disease   . Enlarged prostate   . Hernia   . Malignant neoplasm of liver   . Alcohol dependence   . Seizures     MEDICATIONS:  Prior to Admission medications   Medication Sig Start Date End Date Taking? Authorizing Provider  aspirin EC 81 MG tablet Take 81 mg by mouth daily.      Historical Provider, MD  glipiZIDE (GLUCOTROL) 10 MG tablet Take 10 mg by mouth 2 (two) times daily before a meal.      Historical Provider, MD  insulin aspart (NOVOLOG) 100 UNIT/ML injection Inject 10 Units into the skin 3 (three) times daily before meals.     Historical  Provider, MD  LEVEMIR FLEXTOUCH 100 UNIT/ML Pen Inject 40-50 Units into the skin every evening.  06/20/13   Historical Provider, MD  metoprolol tartrate (LOPRESSOR) 25 MG tablet Take 25 mg by mouth every morning. 12/22/10 07/20/13  Leana Gamer, MD  Multiple Vitamins-Minerals (MULTIVITAMINS THER. W/MINERALS) TABS Take 1 tablet by mouth daily.      Historical Provider, MD  oxyCODONE-acetaminophen (PERCOCET/ROXICET) 5-325 MG per tablet Take 1 tablet by mouth every 4 (four) hours as needed for severe pain. 10/19/48   Delora Fuel, MD  polyethylene glycol [facility] / Floria Raveling) packet Take 17 g by mouth daily.    Historical Provider, MD  potassium chloride (K-DUR) 10 MEQ tablet Take 10 mEq by mouth every morning. 12/22/10 07/20/13  Leana Gamer, MD  saxagliptin HCl (ONGLYZA) 2.5 MG TABS tablet Take 5 mg by mouth daily.     Historical Provider, MD    ALLERGIES:  No Known Allergies  SOCIAL HISTORY:  History  Substance Use Topics  . Smoking status: Current Every Day Smoker -- 1.50 packs/day for 40 years    Types: Cigarettes  . Smokeless tobacco: Never Used  . Alcohol Use: No    FAMILY HISTORY: Family History  Problem Relation Age of Onset  . Diabetes Mother   . Hypertension Mother   . Cancer Father   . Stomach cancer Father   . Cancer Sister   .  Hypertension Other     EXAM: BP 98/60 mmHg  Pulse 98  Temp(Src) 98.2 F (36.8 C) (Oral)  Resp 19  Ht 6\' 2"  (1.88 m)  Wt 122 lb (55.339 kg)  BMI 15.66 kg/m2  SpO2 100% CONSTITUTIONAL: Alert and oriented and responds appropriately to questions. Well-appearing; well-nourished, chronically ill-appearing but in no distress HEAD: Normocephalic EYES: Conjunctivae clear on the right eye, patient has opacification of the left conjunctivae, extra movements intact; pressure in right eye is 21 mmHg; pressure in left eye is 16 mm Hg ENT: normal nose; no rhinorrhea; moist mucous membranes; pharynx without lesions noted NECK: Supple, no meningismus,  no LAD  CARD: RRR; S1 and S2 appreciated; no murmurs, no clicks, no rubs, no gallops RESP: Normal chest excursion without splinting or tachypnea; breath sounds clear and equal bilaterally; no wheezes, no rhonchi, no rales,  ABD/GI: Normal bowel sounds; non-distended; soft, non-tender, no rebound, no guarding BACK:  The back appears normal and is non-tender to palpation, there is no CVA tenderness EXT: Normal ROM in all joints; non-tender to palpation; no edema; normal capillary refill; no cyanosis    SKIN: Normal color for age and race; warm NEURO: Moves all extremities equally; she 5/5 all 4 choice, sensation to light touch intact diffusely, patient has a chronic left-sided facial droop, dysarthria at baseline PSYCH: The patient's mood and manner are appropriate. Grooming and personal hygiene are appropriate.  MEDICAL DECISION MAKING: Pt here with intermittent headaches since September. He is neurologically intact other than facial droop and dysarthria which wife reports is chronic. History is limited as they are extremely poor historians. Given history is limited, will obtain a CT of his head given these new, intermittent headaches. We'll check I pressures. Will check visual acuity. We'll give Reglan, Benadryl, IV fluids. He has no infectious symptoms currently. He has had a cough which is improving and has been on Levaquin by his PCP which was started on 02/10/14.  ED PROGRESS: Patient's head CT shows no acute abnormalities. No infarct or hemorrhage. He reports his headache is gone after Toradol, Reglan and Benadryl. Will discharge home. Blood pressure is improved to 136/77. Have discussed with him the importance of PCP follow-up. We'll have him continue over-the-counter medications as he has had been working for his headache. Discussed with patient that he may need to be on prophylactic medications for his continued headaches.    I personally performed the services described in this  documentation, which was scribed in my presence. The recorded information has been reviewed and is accurate.    [place], DO 02/15/14 2242

## 2014-02-28 ENCOUNTER — Emergency Department (HOSPITAL_COMMUNITY)
Admission: EM | Admit: 2014-02-28 | Discharge: 2014-02-28 | Disposition: A | Discharge status: Home / Self Care | Payer: Medicare Other | Source: Home / Self Care | Attending: Emergency Medicine | Admitting: Emergency Medicine

## 2014-02-28 ENCOUNTER — Encounter (HOSPITAL_COMMUNITY): Payer: Self-pay | Admitting: Cardiology

## 2014-02-28 ENCOUNTER — Emergency Department (HOSPITAL_COMMUNITY): Payer: Medicare Other

## 2014-02-28 DIAGNOSIS — Z794 Long term (current) use of insulin: Secondary | ICD-10-CM | POA: Insufficient documentation

## 2014-02-28 DIAGNOSIS — Z87438 Personal history of other diseases of male genital organs: Secondary | ICD-10-CM

## 2014-02-28 DIAGNOSIS — Z720 Tobacco use: Secondary | ICD-10-CM

## 2014-02-28 DIAGNOSIS — Z79899 Other long term (current) drug therapy: Secondary | ICD-10-CM | POA: Insufficient documentation

## 2014-02-28 DIAGNOSIS — E11649 Type 2 diabetes mellitus with hypoglycemia without coma: Secondary | ICD-10-CM | POA: Insufficient documentation

## 2014-02-28 DIAGNOSIS — Z7982 Long term (current) use of aspirin: Secondary | ICD-10-CM | POA: Insufficient documentation

## 2014-02-28 DIAGNOSIS — J40 Bronchitis, not specified as acute or chronic: Secondary | ICD-10-CM

## 2014-02-28 DIAGNOSIS — R059 Cough, unspecified: Secondary | ICD-10-CM

## 2014-02-28 DIAGNOSIS — J690 Pneumonitis due to inhalation of food and vomit: Secondary | ICD-10-CM | POA: Diagnosis not present

## 2014-02-28 DIAGNOSIS — Z8505 Personal history of malignant neoplasm of liver: Secondary | ICD-10-CM | POA: Insufficient documentation

## 2014-02-28 DIAGNOSIS — Z8719 Personal history of other diseases of the digestive system: Secondary | ICD-10-CM

## 2014-02-28 DIAGNOSIS — R05 Cough: Secondary | ICD-10-CM

## 2014-02-28 DIAGNOSIS — J209 Acute bronchitis, unspecified: Secondary | ICD-10-CM | POA: Insufficient documentation

## 2014-02-28 DIAGNOSIS — E162 Hypoglycemia, unspecified: Secondary | ICD-10-CM

## 2014-02-28 LAB — BASIC METABOLIC PANEL
Anion gap: 14 (ref 5–15)
BUN: 8 mg/dL (ref 6–23)
CHLORIDE: 98 meq/L (ref 96–112)
CO2: 29 mEq/L (ref 19–32)
Calcium: 8.8 mg/dL (ref 8.4–10.5)
Creatinine, Ser: 0.59 mg/dL (ref 0.50–1.35)
GFR calc non Af Amer: >90 mL/min (ref >90)
Glucose, Bld: 92 mg/dL (ref 70–99)
Potassium: 2.8 mEq/L — CL (ref 3.7–5.3)
Sodium: 141 mEq/L (ref 137–147)

## 2014-02-28 LAB — CBC WITH DIFFERENTIAL/PLATELET
BASOS PCT: 0 % (ref 0–1)
Basophils Absolute: 0 10*3/uL (ref 0.0–0.1)
EOS ABS: 0.1 10*3/uL (ref 0.0–0.7)
Eosinophils Relative: 0 % (ref 0–5)
HEMATOCRIT: 33.8 % — AB (ref 39.0–52.0)
HEMOGLOBIN: 10.4 g/dL — AB (ref 13.0–17.0)
Lymphocytes Relative: 9 % — ABNORMAL LOW (ref 12–46)
Lymphs Abs: 1.7 10*3/uL (ref 0.7–4.0)
MCH: 21.4 pg — AB (ref 26.0–34.0)
MCHC: 30.8 g/dL (ref 30.0–36.0)
MCV: 69.5 fL — ABNORMAL LOW (ref 78.0–100.0)
MONO ABS: 0.6 10*3/uL (ref 0.1–1.0)
MONOS PCT: 4 % (ref 3–12)
NEUTROS ABS: 16.1 10*3/uL — AB (ref 1.7–7.7)
Neutrophils Relative %: 87 % — ABNORMAL HIGH (ref 43–77)
Platelets: 505 10*3/uL — ABNORMAL HIGH (ref 150–400)
RBC: 4.86 MIL/uL (ref 4.22–5.81)
RDW: 16.5 % — ABNORMAL HIGH (ref 11.5–15.5)
WBC: 18.5 10*3/uL — ABNORMAL HIGH (ref 4.0–10.5)

## 2014-02-28 LAB — CBG MONITORING, ED
Glucose-Capillary: 109 mg/dL — ABNORMAL HIGH (ref 70–99)
Glucose-Capillary: 89 mg/dL (ref 70–99)
Glucose-Capillary: 136 mg/dL — ABNORMAL HIGH (ref 70–99)
Glucose-Capillary: 15 mg/dL — CL (ref 70–99)

## 2014-02-28 MED ORDER — LEVOFLOXACIN 500 MG PO TABS
500.0000 mg | ORAL_TABLET | Freq: Once | ORAL | Status: AC
  Administered 2014-02-28: 500 mg via ORAL
  Filled 2014-02-28: qty 1

## 2014-02-28 MED ORDER — LEVOFLOXACIN 500 MG PO TABS: 500.0000 mg | ORAL_TABLET | Freq: Every day | ORAL | Status: DC

## 2014-02-28 MED ORDER — DEXTROSE 50 % IV SOLN
INTRAVENOUS | Status: AC
  Administered 2014-02-28: 17:00:00
  Filled 2014-02-28: qty 50

## 2014-02-28 MED ORDER — SODIUM CHLORIDE 0.9 % IV BOLUS (SEPSIS)
1000.0000 mL | Freq: Once | INTRAVENOUS | Status: AC
  Administered 2014-02-28: 1000 mL via INTRAVENOUS

## 2014-02-28 NOTE — ED Provider Notes (Signed)
CSN: [id number]     Arrival date & time 02/28/14  1221 History  This chart was scribe for Hoy Morn, MD by Judithann Sauger, ED Scribe. The patient was seen in room APA19/APA19 and the patient's care was started at 1:58 PM.      Chief Complaint  Patient presents with  . Hypoglycemia  . Cough   Patient is a 63 y.o. male presenting with cough. The history is provided by the patient. No language interpreter was used.  Cough  HPI Comments: [name] is a 63 y.o. male brought in by ambulance, who presents to the Emergency Department complaining of hypoglycemia. He states that he has been compliant with his medication but reports that he is homeless and has not been able to eat and drink properly. He reports a productive cough onset 1 week. He denies any N/V/D. He also c/o a "knot" on the right side of his neck.   Past Medical History  Diagnosis Date  . Diabetes mellitus   . Liver disease   . Enlarged prostate   . Hernia   . Malignant neoplasm of liver   . Alcohol dependence   . Seizures    Past Surgical History  Procedure Laterality Date  . Appendectomy     Family History  Problem Relation Age of Onset  . Diabetes Mother   . Hypertension Mother   . Cancer Father   . Stomach cancer Father   . Cancer Sister   . Hypertension Other    History  Substance Use Topics  . Smoking status: Current Every Day Smoker -- 1.50 packs/day for 40 years    Types: Cigarettes  . Smokeless tobacco: Never Used  . Alcohol Use: No    Review of Systems  Respiratory: Positive for cough.   Gastrointestinal: Negative for nausea, vomiting and diarrhea.  Hematological: Positive for adenopathy.  All other systems reviewed and are negative.     Allergies  Review of patient's allergies indicates no known allergies.  Home Medications   Prior to Admission medications   Medication Sig Start Date End Date Taking? Authorizing Provider  atorvastatin (LIPITOR) 10 MG tablet Take 10 mg by  mouth every evening.    Yes Historical Provider, MD  glipiZIDE (GLUCOTROL) 10 MG tablet Take 10 mg by mouth 2 (two) times daily before a meal.     Yes Historical Provider, MD  LEVEMIR FLEXTOUCH 100 UNIT/ML Pen Inject 50 Units into the skin at bedtime.  06/20/13  Yes Historical Provider, MD  lisinopril (PRINIVIL,ZESTRIL) 5 MG tablet Take 5 mg by mouth daily.   Yes Historical Provider, MD  aspirin EC 81 MG tablet Take 81 mg by mouth daily.      Historical Provider, MD  ferrous sulfate 325 (65 FE) MG tablet Take 650 mg by mouth 2 (two) times daily with a meal.    Historical Provider, MD  insulin aspart (NOVOLOG) 100 UNIT/ML injection Inject 16 Units into the skin every morning.     Historical Provider, MD  oxyCODONE-acetaminophen (PERCOCET/ROXICET) 5-325 MG per tablet Take 1 tablet by mouth every 4 (four) hours as needed for severe pain. Patient not taking: Reported on 02/15/2014 10/22/61   Delora Fuel, MD  polyethylene glycol [facility] / Floria Raveling) packet Take 17 g by mouth daily.    Historical Provider, MD   BP 128/84 mmHg  Pulse 110  Temp(Src) 97.8 F (36.6 C) (Oral)  Resp 18  Ht 6' (1.829 m)  Wt 122 lb (55.339 kg)  BMI  16.54 kg/m2  SpO2 88% Physical Exam  Constitutional: He is oriented to person, place, and time. He appears well-developed and well-nourished. No distress.  HENT:  Head: Normocephalic and atraumatic.  Eyes: Conjunctivae and EOM are normal.  Neck: Neck supple. No tracheal deviation present.  Small mobile lymphadenopathy on right neck.   Cardiovascular: Normal rate.   Pulmonary/Chest: Effort normal. No respiratory distress.  Musculoskeletal: Normal range of motion.  Neurological: He is alert and oriented to person, place, and time.  Skin: Skin is warm and dry.  Psychiatric: He has a normal mood and affect. His behavior is normal.  Nursing note and vitals reviewed.   ED Course  Procedures (including critical care time) DIAGNOSTIC STUDIES: Oxygen Saturation is 88% on [place],  low by my interpretation.    COORDINATION OF CARE: 2:00 PM- Pt advised of plan for treatment and pt agrees.    Labs Review Labs Reviewed  CBC WITH DIFFERENTIAL - Abnormal; Notable for the following:    WBC 18.5 (*)    Hemoglobin 10.4 (*)    HCT 33.8 (*)    MCV 69.5 (*)    MCH 21.4 (*)    RDW 16.5 (*)    Platelets 505 (*)    Neutrophils Relative % 87 (*)    Neutro Abs 16.1 (*)    Lymphocytes Relative 9 (*)    All other components within normal limits  BASIC METABOLIC PANEL - Abnormal; Notable for the following:    Potassium 2.8 (*)    All other components within normal limits  CBG MONITORING, ED - Abnormal; Notable for the following:    Glucose-Capillary 109 (*)    All other components within normal limits  CBG MONITORING, ED - Abnormal; Notable for the following:    Glucose-Capillary 15 (*)    All other components within normal limits  CBG MONITORING, ED - Abnormal; Notable for the following:    Glucose-Capillary 136 (*)    All other components within normal limits  CBG MONITORING, ED    Imaging Review Dg Chest 2 View  02/28/2014   CLINICAL DATA:  Cough and congestion for several days.  EXAM: CHEST  2 VIEW  COMPARISON:  05/23/2010  FINDINGS: Mild lung base opacity is noted, greater on the right. This may all be atelectasis. Pneumonia is possible but felt less likely. Elevation the right hemidiaphragm is stable.  No evidence of pulmonary edema. No pleural effusion or pneumothorax.  Cardiac silhouette is normal in size. Normal mediastinal and hilar contours.  Bony thorax is demineralized but intact.  IMPRESSION: 1. Lung base opacity, right greater than left, likely atelectasis. Right lower lobe pneumonia is possible, but felt less likely. 2. Chronic mild elevation the right hemidiaphragm. No pulmonary edema.   Electronically Signed   By: Lajean Manes M.D.   On: 02/28/2014 14:03  I personally reviewed the imaging tests through PACS system I reviewed available ER/hospitalization  records through the EMR    EKG Interpretation None      MDM   Final diagnoses:  Cough  Bronchitis  Hypoglycemia   Time of discharge the patient is feeling much better.  He ate a sandwich.  He did have recurrent hypoglycemia in the emergency department received another dose of dextrose.  This was secondary to him not being fed initially.  Chest x-ray likely bronchitis but could represent early developing pneumonia.  Home with a course of antibiotics.  Levaquin in the emergency department.  Patient has been given a sandwich.  I personally  performed the services described in this documentation, which was scribed in my presence. The recorded information has been reviewed and is accurate.     Hoy Morn, MD 02/28/14 2107

## 2014-02-28 NOTE — Discharge Instructions (Signed)
Hypoglycemia °Hypoglycemia occurs when the glucose in your blood is too low. Glucose is a type of sugar that is your body's main energy source. Hormones, such as insulin and glucagon, control the level of glucose in the blood. Insulin lowers blood glucose and glucagon increases blood glucose. Having too much insulin in your blood stream, or not eating enough food containing sugar, can result in hypoglycemia. Hypoglycemia can happen to people with or without diabetes. It can develop quickly and can be a medical emergency.  °CAUSES  °· Missing or delaying meals. °· Not eating enough carbohydrates at meals. °· Taking too much diabetes medicine. °· Not timing your oral diabetes medicine or insulin doses with meals, snacks, and exercise. °· Nausea and vomiting. °· Certain medicines. °· Severe illnesses, such as hepatitis, kidney disorders, and certain eating disorders. °· Increased activity or exercise without eating something extra or adjusting medicines. °· Drinking too much alcohol. °· A nerve disorder that affects body functions like your heart rate, blood pressure, and digestion (autonomic neuropathy). °· A condition where the stomach muscles do not function properly (gastroparesis). Therefore, medicines and food may not absorb properly. °· Rarely, a tumor of the pancreas can produce too much insulin. °SYMPTOMS  °· Hunger. °· Sweating (diaphoresis). °· Change in body temperature. °· Shakiness. °· Headache. °· Anxiety. °· Lightheadedness. °· Irritability. °· Difficulty concentrating. °· Dry mouth. °· Tingling or numbness in the hands or feet. °· Restless sleep or sleep disturbances. °· Altered speech and coordination. °· Change in mental status. °· Seizures or prolonged convulsions. °· Combativeness. °· Drowsiness (lethargic). °· Weakness. °· Increased heart rate or palpitations. °· Confusion. °· Pale, gray skin color. °· Blurred or double vision. °· Fainting. °DIAGNOSIS  °A physical exam and medical history will be  performed. Your caregiver may make a diagnosis based on your symptoms. Blood tests and other lab tests may be performed to confirm a diagnosis. Once the diagnosis is made, your caregiver will see if your signs and symptoms go away once your blood glucose is raised.  °TREATMENT  °Usually, you can easily treat your hypoglycemia when you notice symptoms. °· Check your blood glucose. If it is less than 70 mg/dl, take one of the following:   °¨ 3-4 glucose tablets.   °¨ ½ cup juice.   °¨ ½ cup regular soda.   °¨ 1 cup skim milk.   °¨ ½-1 tube of glucose gel.   °¨ 5-6 hard candies.   °· Avoid high-fat drinks or food that may delay a rise in blood glucose levels. °· Do not take more than the recommended amount of sugary foods, drinks, gel, or tablets. Doing so will cause your blood glucose to go too high.   °· Wait 10-15 minutes and recheck your blood glucose. If it is still less than 70 mg/dl or below your target range, repeat treatment.   °· Eat a snack if it is more than 1 hour until your next meal.   °There may be a time when your blood glucose may go so low that you are unable to treat yourself at home when you start to notice symptoms. You may need someone to help you. You may even faint or be unable to swallow. If you cannot treat yourself, someone will need to bring you to the hospital.  °HOME CARE INSTRUCTIONS °· If you have diabetes, follow your diabetes management plan by: °¨ Taking your medicines as directed. °¨ Following your exercise plan. °¨ Following your meal plan. Do not skip meals. Eat on time. °¨ Testing your blood   glucose regularly. Check your blood glucose before and after exercise. If you exercise longer or different than usual, be sure to check blood glucose more frequently.  Wearing your medical alert jewelry that says you have diabetes.  Identify the cause of your hypoglycemia. Then, develop ways to prevent the recurrence of hypoglycemia.  Do not take a hot bath or shower right after an  insulin shot.  Always carry treatment with you. Glucose tablets are the easiest to carry.  If you are going to drink alcohol, drink it only with meals.  Tell friends or family members ways to keep you safe during a seizure. This may include removing hard or sharp objects from the area or turning you on your side.  Maintain a healthy weight. SEEK MEDICAL CARE IF:   You are having problems keeping your blood glucose in your target range.  You are having frequent episodes of hypoglycemia.  You feel you might be having side effects from your medicines.  You are not sure why your blood glucose is dropping so low.  You notice a change in vision or a new problem with your vision. SEEK IMMEDIATE MEDICAL CARE IF:   Confusion develops.  A change in mental status occurs.  The inability to swallow develops.  Fainting occurs. Document Released: 03/06/2005 Document Revised: 03/11/2013 Document Reviewed: 07/03/2011 [facility] Patient Information 2015 [place], [place]. This information is not intended to replace advice given to you by your health care provider. Make sure you discuss any questions you have with your health care provider. Acute Bronchitis Bronchitis is inflammation of the airways that extend from the windpipe into the lungs (bronchi). The inflammation often causes mucus to develop. This leads to a cough, which is the most common symptom of bronchitis.  In acute bronchitis, the condition usually develops suddenly and goes away over time, usually in a couple weeks. Smoking, allergies, and asthma can make bronchitis worse. Repeated episodes of bronchitis may cause further lung problems.  CAUSES Acute bronchitis is most often caused by the same virus that causes a cold. The virus can spread from person to person (contagious) through coughing, sneezing, and touching contaminated objects. SIGNS AND SYMPTOMS   Cough.   Fever.   Coughing up mucus.   Body aches.   Chest  congestion.   Chills.   Shortness of breath.   Sore throat.  DIAGNOSIS  Acute bronchitis is usually diagnosed through a physical exam. Your health care provider will also ask you questions about your medical history. Tests, such as chest X-rays, are sometimes done to rule out other conditions.  TREATMENT  Acute bronchitis usually goes away in a couple weeks. Oftentimes, no medical treatment is necessary. Medicines are sometimes given for relief of fever or cough. Antibiotic medicines are usually not needed but may be prescribed in certain situations. In some cases, an inhaler may be recommended to help reduce shortness of breath and control the cough. A cool mist vaporizer may also be used to help thin bronchial secretions and make it easier to clear the chest.  HOME CARE INSTRUCTIONS  Get plenty of rest.   Drink enough fluids to keep your urine clear or pale yellow (unless you have a medical condition that requires fluid restriction). Increasing fluids may help thin your respiratory secretions (sputum) and reduce chest congestion, and it will prevent dehydration.   Take medicines only as directed by your health care provider.  If you were prescribed an antibiotic medicine, finish it all even if you start to  feel better.  Avoid smoking and secondhand smoke. Exposure to cigarette smoke or irritating chemicals will make bronchitis worse. If you are a smoker, consider using nicotine gum or skin patches to help control withdrawal symptoms. Quitting smoking will help your lungs heal faster.   Reduce the chances of another bout of acute bronchitis by washing your hands frequently, avoiding people with cold symptoms, and trying not to touch your hands to your mouth, nose, or eyes.   Keep all follow-up visits as directed by your health care provider.  SEEK MEDICAL CARE IF: Your symptoms do not improve after 1 week of treatment.  SEEK IMMEDIATE MEDICAL CARE IF:  You develop an increased  fever or chills.   You have chest pain.   You have severe shortness of breath.  You have bloody sputum.   You develop dehydration.  You faint or repeatedly feel like you are going to pass out.  You develop repeated vomiting.  You develop a severe headache. MAKE SURE YOU:   Understand these instructions.  Will watch your condition.  Will get help right away if you are not doing well or get worse. Document Released: 04/13/2004 Document Revised: 07/21/2013 Document Reviewed: 08/27/2012 [facility] Patient Information 2015 [place], [place]. This information is not intended to replace advice given to you by your health care provider. Make sure you discuss any questions you have with your health care provider.

## 2014-02-28 NOTE — ED Notes (Signed)
Patient A&O, CBG 136. Family at bedside.

## 2014-02-28 NOTE — ED Notes (Signed)
Ems called out.  Pt unresponsive.  CBG 34.  Ems gave 2 mg glucagon. cbg now 96.  Pt has a yellow productive cough with ems.  Pt c/o right sided neck pain.

## 2014-02-28 NOTE — ED Notes (Signed)
Informed by ED Tech, Corene Cornea patients CBG 15 and patient was hard to arouse. Dr Venora Maples at nursing station when I was informed. Dextrose 50% solution was given. Immediately after giving patient became more alert and was able to answer questions and drink juice from cup. Family at bedside.

## 2014-03-02 ENCOUNTER — Inpatient Hospital Stay (HOSPITAL_COMMUNITY)
Admission: EM | Admit: 2014-03-02 | Discharge: 2014-03-10 | DRG: 987 | Disposition: A | Discharge status: Home Health | Payer: Medicare Other | Attending: Family Medicine | Admitting: Family Medicine

## 2014-03-02 ENCOUNTER — Encounter (HOSPITAL_COMMUNITY): Payer: Self-pay | Admitting: Emergency Medicine

## 2014-03-02 ENCOUNTER — Emergency Department (HOSPITAL_COMMUNITY): Payer: Medicare Other

## 2014-03-02 DIAGNOSIS — K648 Other hemorrhoids: Secondary | ICD-10-CM | POA: Diagnosis present

## 2014-03-02 DIAGNOSIS — K7030 Alcoholic cirrhosis of liver without ascites: Secondary | ICD-10-CM | POA: Diagnosis present

## 2014-03-02 DIAGNOSIS — R590 Localized enlarged lymph nodes: Secondary | ICD-10-CM | POA: Diagnosis present

## 2014-03-02 DIAGNOSIS — R591 Generalized enlarged lymph nodes: Secondary | ICD-10-CM | POA: Diagnosis present

## 2014-03-02 DIAGNOSIS — E11649 Type 2 diabetes mellitus with hypoglycemia without coma: Secondary | ICD-10-CM | POA: Diagnosis present

## 2014-03-02 DIAGNOSIS — C801 Malignant (primary) neoplasm, unspecified: Secondary | ICD-10-CM | POA: Diagnosis present

## 2014-03-02 DIAGNOSIS — E785 Hyperlipidemia, unspecified: Secondary | ICD-10-CM | POA: Diagnosis present

## 2014-03-02 DIAGNOSIS — K6389 Other specified diseases of intestine: Secondary | ICD-10-CM | POA: Diagnosis present

## 2014-03-02 DIAGNOSIS — R569 Unspecified convulsions: Secondary | ICD-10-CM | POA: Diagnosis present

## 2014-03-02 DIAGNOSIS — K4090 Unilateral inguinal hernia, without obstruction or gangrene, not specified as recurrent: Secondary | ICD-10-CM | POA: Diagnosis present

## 2014-03-02 DIAGNOSIS — D490 Neoplasm of unspecified behavior of digestive system: Secondary | ICD-10-CM | POA: Diagnosis present

## 2014-03-02 DIAGNOSIS — K7460 Unspecified cirrhosis of liver: Secondary | ICD-10-CM | POA: Diagnosis present

## 2014-03-02 DIAGNOSIS — Z8249 Family history of ischemic heart disease and other diseases of the circulatory system: Secondary | ICD-10-CM

## 2014-03-02 DIAGNOSIS — N1330 Unspecified hydronephrosis: Secondary | ICD-10-CM | POA: Diagnosis present

## 2014-03-02 DIAGNOSIS — Z79891 Long term (current) use of opiate analgesic: Secondary | ICD-10-CM

## 2014-03-02 DIAGNOSIS — K5900 Constipation, unspecified: Secondary | ICD-10-CM | POA: Diagnosis present

## 2014-03-02 DIAGNOSIS — R1310 Dysphagia, unspecified: Secondary | ICD-10-CM

## 2014-03-02 DIAGNOSIS — IMO0002 Reserved for concepts with insufficient information to code with codable children: Secondary | ICD-10-CM | POA: Diagnosis present

## 2014-03-02 DIAGNOSIS — D638 Anemia in other chronic diseases classified elsewhere: Secondary | ICD-10-CM | POA: Diagnosis present

## 2014-03-02 DIAGNOSIS — Z800 Family history of malignant neoplasm of digestive organs: Secondary | ICD-10-CM

## 2014-03-02 DIAGNOSIS — E43 Unspecified severe protein-calorie malnutrition: Secondary | ICD-10-CM | POA: Diagnosis present

## 2014-03-02 DIAGNOSIS — J690 Pneumonitis due to inhalation of food and vomit: Principal | ICD-10-CM | POA: Diagnosis present

## 2014-03-02 DIAGNOSIS — Z720 Tobacco use: Secondary | ICD-10-CM | POA: Diagnosis present

## 2014-03-02 DIAGNOSIS — G40909 Epilepsy, unspecified, not intractable, without status epilepticus: Secondary | ICD-10-CM

## 2014-03-02 DIAGNOSIS — K449 Diaphragmatic hernia without obstruction or gangrene: Secondary | ICD-10-CM | POA: Diagnosis present

## 2014-03-02 DIAGNOSIS — C799 Secondary malignant neoplasm of unspecified site: Secondary | ICD-10-CM | POA: Diagnosis present

## 2014-03-02 DIAGNOSIS — N132 Hydronephrosis with renal and ureteral calculous obstruction: Secondary | ICD-10-CM | POA: Diagnosis present

## 2014-03-02 DIAGNOSIS — R0902 Hypoxemia: Secondary | ICD-10-CM | POA: Diagnosis present

## 2014-03-02 DIAGNOSIS — I10 Essential (primary) hypertension: Secondary | ICD-10-CM | POA: Diagnosis present

## 2014-03-02 DIAGNOSIS — Z794 Long term (current) use of insulin: Secondary | ICD-10-CM

## 2014-03-02 DIAGNOSIS — K8020 Calculus of gallbladder without cholecystitis without obstruction: Secondary | ICD-10-CM | POA: Diagnosis present

## 2014-03-02 DIAGNOSIS — Z5900 Homelessness unspecified: Secondary | ICD-10-CM

## 2014-03-02 DIAGNOSIS — E780 Pure hypercholesterolemia: Secondary | ICD-10-CM | POA: Diagnosis present

## 2014-03-02 DIAGNOSIS — R221 Localized swelling, mass and lump, neck: Secondary | ICD-10-CM

## 2014-03-02 DIAGNOSIS — D122 Benign neoplasm of ascending colon: Secondary | ICD-10-CM | POA: Diagnosis present

## 2014-03-02 DIAGNOSIS — Z681 Body mass index (BMI) 19 or less, adult: Secondary | ICD-10-CM

## 2014-03-02 DIAGNOSIS — C787 Secondary malignant neoplasm of liver and intrahepatic bile duct: Secondary | ICD-10-CM | POA: Diagnosis present

## 2014-03-02 DIAGNOSIS — E222 Syndrome of inappropriate secretion of antidiuretic hormone: Secondary | ICD-10-CM | POA: Diagnosis present

## 2014-03-02 DIAGNOSIS — N400 Enlarged prostate without lower urinary tract symptoms: Secondary | ICD-10-CM | POA: Diagnosis present

## 2014-03-02 DIAGNOSIS — Z8505 Personal history of malignant neoplasm of liver: Secondary | ICD-10-CM

## 2014-03-02 DIAGNOSIS — I8500 Esophageal varices without bleeding: Secondary | ICD-10-CM | POA: Diagnosis present

## 2014-03-02 DIAGNOSIS — D649 Anemia, unspecified: Secondary | ICD-10-CM

## 2014-03-02 DIAGNOSIS — E1165 Type 2 diabetes mellitus with hyperglycemia: Secondary | ICD-10-CM | POA: Diagnosis present

## 2014-03-02 DIAGNOSIS — K2210 Ulcer of esophagus without bleeding: Secondary | ICD-10-CM | POA: Diagnosis present

## 2014-03-02 DIAGNOSIS — J189 Pneumonia, unspecified organism: Secondary | ICD-10-CM

## 2014-03-02 DIAGNOSIS — D509 Iron deficiency anemia, unspecified: Secondary | ICD-10-CM | POA: Diagnosis present

## 2014-03-02 DIAGNOSIS — F17210 Nicotine dependence, cigarettes, uncomplicated: Secondary | ICD-10-CM | POA: Diagnosis present

## 2014-03-02 DIAGNOSIS — Z833 Family history of diabetes mellitus: Secondary | ICD-10-CM

## 2014-03-02 DIAGNOSIS — K210 Gastro-esophageal reflux disease with esophagitis: Secondary | ICD-10-CM | POA: Diagnosis present

## 2014-03-02 DIAGNOSIS — F1010 Alcohol abuse, uncomplicated: Secondary | ICD-10-CM | POA: Diagnosis present

## 2014-03-02 DIAGNOSIS — E876 Hypokalemia: Secondary | ICD-10-CM | POA: Diagnosis present

## 2014-03-02 DIAGNOSIS — R160 Hepatomegaly, not elsewhere classified: Secondary | ICD-10-CM | POA: Diagnosis present

## 2014-03-02 DIAGNOSIS — C770 Secondary and unspecified malignant neoplasm of lymph nodes of head, face and neck: Secondary | ICD-10-CM | POA: Diagnosis present

## 2014-03-02 DIAGNOSIS — Z792 Long term (current) use of antibiotics: Secondary | ICD-10-CM

## 2014-03-02 DIAGNOSIS — R7881 Bacteremia: Secondary | ICD-10-CM | POA: Diagnosis present

## 2014-03-02 HISTORY — DX: Pure hypercholesterolemia, unspecified: E78.00

## 2014-03-02 HISTORY — DX: Unspecified hydronephrosis: N13.30

## 2014-03-02 HISTORY — DX: Secondary malignant neoplasm of unspecified site: C79.9

## 2014-03-02 HISTORY — DX: Liver disease, unspecified: K76.9

## 2014-03-02 HISTORY — DX: Calculus of gallbladder without cholecystitis without obstruction: K80.20

## 2014-03-02 HISTORY — DX: Other specified noninflammatory disorders of cervix uteri: N88.8

## 2014-03-02 HISTORY — DX: Unspecified protein-calorie malnutrition: E46

## 2014-03-02 HISTORY — DX: Other nonspecific abnormal finding of lung field: R91.8

## 2014-03-02 HISTORY — DX: Esophageal varices without bleeding: I85.00

## 2014-03-02 HISTORY — DX: Anemia, unspecified: D64.9

## 2014-03-02 LAB — BASIC METABOLIC PANEL
Anion gap: 12 (ref 5–15)
BUN: 5 mg/dL — ABNORMAL LOW (ref 6–23)
CHLORIDE: 93 meq/L — AB (ref 96–112)
CO2: 28 mEq/L (ref 19–32)
CREATININE: 0.61 mg/dL (ref 0.50–1.35)
Calcium: 8.7 mg/dL (ref 8.4–10.5)
GFR calc Af Amer: >90 mL/min (ref >90)
GFR calc non Af Amer: >90 mL/min (ref >90)
Glucose, Bld: 288 mg/dL — ABNORMAL HIGH (ref 70–99)
Potassium: 3.3 mEq/L — ABNORMAL LOW (ref 3.7–5.3)
Sodium: 133 mEq/L — ABNORMAL LOW (ref 137–147)

## 2014-03-02 LAB — CBC WITH DIFFERENTIAL/PLATELET
Basophils Absolute: 0 10*3/uL (ref 0.0–0.1)
Basophils Relative: 0 % (ref 0–1)
EOS PCT: 1 % (ref 0–5)
Eosinophils Absolute: 0.1 10*3/uL (ref 0.0–0.7)
HEMATOCRIT: 30.5 % — AB (ref 39.0–52.0)
Hemoglobin: 9.6 g/dL — ABNORMAL LOW (ref 13.0–17.0)
Lymphocytes Relative: 13 % (ref 12–46)
Lymphs Abs: 1.8 10*3/uL (ref 0.7–4.0)
MCH: 21.5 pg — ABNORMAL LOW (ref 26.0–34.0)
MCHC: 31.5 g/dL (ref 30.0–36.0)
MCV: 68.2 fL — ABNORMAL LOW (ref 78.0–100.0)
Monocytes Absolute: 0.7 10*3/uL (ref 0.1–1.0)
Monocytes Relative: 5 % (ref 3–12)
NEUTROS ABS: 11.4 10*3/uL — AB (ref 1.7–7.7)
Neutrophils Relative %: 81 % — ABNORMAL HIGH (ref 43–77)
Platelets: 379 10*3/uL (ref 150–400)
RBC: 4.47 MIL/uL (ref 4.22–5.81)
RDW: 16.3 % — ABNORMAL HIGH (ref 11.5–15.5)
WBC: 14 10*3/uL — AB (ref 4.0–10.5)

## 2014-03-02 LAB — GLUCOSE, CAPILLARY: GLUCOSE-CAPILLARY: 288 mg/dL — AB (ref 70–99)

## 2014-03-02 LAB — CBG MONITORING, ED: Glucose-Capillary: 325 mg/dL — ABNORMAL HIGH (ref 70–99)

## 2014-03-02 LAB — CG4 I-STAT (LACTIC ACID): Lactic Acid, Venous: 2.36 mmol/L — ABNORMAL HIGH (ref 0.5–2.2)

## 2014-03-02 MED ORDER — LEVOFLOXACIN IN D5W 750 MG/150ML IV SOLN
750.0000 mg | Freq: Once | INTRAVENOUS | Status: AC
  Administered 2014-03-02: 750 mg via INTRAVENOUS
  Filled 2014-03-02: qty 150

## 2014-03-02 MED ORDER — ACETAMINOPHEN 650 MG RE SUPP: 650.0000 mg | Freq: Four times a day (QID) | RECTAL | Status: DC | PRN

## 2014-03-02 MED ORDER — OXYCODONE HCL 5 MG PO TABS
5.0000 mg | ORAL_TABLET | ORAL | Status: DC | PRN
  Administered 2014-03-04 – 2014-03-09 (×2): 5 mg via ORAL
  Filled 2014-03-02 (×2): qty 1

## 2014-03-02 MED ORDER — SODIUM CHLORIDE 0.9 % IV SOLN
INTRAVENOUS | Status: DC
  Administered 2014-03-02: via INTRAVENOUS

## 2014-03-02 MED ORDER — INSULIN ASPART 100 UNIT/ML ~~LOC~~ SOLN
0.0000 [IU] | Freq: Three times a day (TID) | SUBCUTANEOUS | Status: DC
  Administered 2014-03-03: 1 [IU] via SUBCUTANEOUS
  Administered 2014-03-03: 2 [IU] via SUBCUTANEOUS
  Administered 2014-03-04: 7 [IU] via SUBCUTANEOUS
  Administered 2014-03-04 – 2014-03-06 (×3): 3 [IU] via SUBCUTANEOUS
  Administered 2014-03-06 – 2014-03-07 (×3): 2 [IU] via SUBCUTANEOUS
  Administered 2014-03-08: 5 [IU] via SUBCUTANEOUS
  Administered 2014-03-09: 1 [IU] via SUBCUTANEOUS
  Administered 2014-03-10: 2 [IU] via SUBCUTANEOUS
  Administered 2014-03-10: 5 [IU] via SUBCUTANEOUS

## 2014-03-02 MED ORDER — POTASSIUM CHLORIDE 10 MEQ/100ML IV SOLN
10.0000 meq | INTRAVENOUS | Status: AC
  Administered 2014-03-02 – 2014-03-03 (×4): 10 meq via INTRAVENOUS
  Filled 2014-03-02 (×4): qty 100

## 2014-03-02 MED ORDER — INFLUENZA VAC SPLIT QUAD 0.5 ML IM SUSY
0.5000 mL | PREFILLED_SYRINGE | INTRAMUSCULAR | Status: AC
  Administered 2014-03-03: 0.5 mL via INTRAMUSCULAR
  Filled 2014-03-02: qty 0.5

## 2014-03-02 MED ORDER — HEPARIN SODIUM (PORCINE) 5000 UNIT/ML IJ SOLN
5000.0000 [IU] | Freq: Three times a day (TID) | INTRAMUSCULAR | Status: DC
  Administered 2014-03-03 (×2): 5000 [IU] via SUBCUTANEOUS
  Filled 2014-03-02 (×2): qty 1

## 2014-03-02 MED ORDER — LEVOFLOXACIN IN D5W 750 MG/150ML IV SOLN
750.0000 mg | INTRAVENOUS | Status: DC
  Administered 2014-03-03 – 2014-03-04 (×2): 750 mg via INTRAVENOUS
  Filled 2014-03-02 (×2): qty 150

## 2014-03-02 MED ORDER — LISINOPRIL 5 MG PO TABS
5.0000 mg | ORAL_TABLET | Freq: Every day | ORAL | Status: DC
  Administered 2014-03-03 – 2014-03-10 (×8): 5 mg via ORAL
  Filled 2014-03-02 (×9): qty 1

## 2014-03-02 MED ORDER — ATORVASTATIN CALCIUM 10 MG PO TABS: 10.0000 mg | ORAL_TABLET | Freq: Every evening | ORAL | Status: DC

## 2014-03-02 MED ORDER — ACETAMINOPHEN 325 MG PO TABS
650.0000 mg | ORAL_TABLET | Freq: Four times a day (QID) | ORAL | Status: DC | PRN
  Administered 2014-03-08: 650 mg via ORAL
  Filled 2014-03-02: qty 2

## 2014-03-02 NOTE — ED Notes (Signed)
Pt brought in by EMS. Report that pt had been choking on food, cleared by the time EMS arrived. Neighbor insisted that pt be brought to be checked out. Per EMS all vitals WNL. No distress at present.

## 2014-03-02 NOTE — H&P (Signed)
[facility] History and Physical  [name] CBJ:[id number] DOB: [birth year] DOA: 03/02/2014  Referring physician: Dr. Thurnell Garbe - [facility] PCP: [name], MD   Chief Complaint: Cough  HPI: [name] is a 63 y.o. male  Cough and SOB ongoing for 2 wks. Associated w/ nausia. Denies fevers, CP, palpitaitons, syncope, HA diarrhea. Intermittent rinorrhea. Seen in the ED on 02/28/14 and prescribed Levaquin for possible Pneumonia. Friend was supposed to pick up medicine for pt but did not. . Friend witnessed pt apparently choking on food and called EMS. No loss of consciousness. Pt reports neck fullness and difficulty swallowing food from time to time. Pt reports being homeless. Pt primarily lives off of [address].    Review of Systems:  Constitutional:  No  Fevers, chills, fatigue.  HEENT:  No headaches,    Cardio-vascular:  No chest pain, Orthopnea, PND, swelling in lower extremities, anasarca, dizziness, palpitations  GI:  No heartburn, indigestion, abdominal pain, nausea, vomiting, diarrhea, change in bowel habits, loss of appetite  Resp:  Per HPI Skin:  no rash or lesions.  GU:  no dysuria, change in color of urine, no urgency or frequency. No flank pain.  Musculoskeletal:  No joint pain or swelling. No decreased range of motion. No back pain.  Psych:  No change in mood or affect. No depression or anxiety. No memory loss.   Past Medical History  Diagnosis Date  . Diabetes mellitus   . Liver disease   . Enlarged prostate   . Hernia   . Malignant neoplasm of liver   . Alcohol dependence   . Seizures    Past Surgical History  Procedure Laterality Date  . Appendectomy     Social History:  reports that he has been smoking Cigarettes.  He has a 60 pack-year smoking history. He has never used smokeless tobacco. He reports that he drinks alcohol. He reports that he does not use illicit drugs.  No Known Allergies  Family History  Problem  Relation Age of Onset  . Diabetes Mother   . Hypertension Mother   . Cancer Father   . Stomach cancer Father   . Cancer Sister   . Hypertension Other      Prior to Admission medications   Medication Sig Start Date End Date Taking? Authorizing Provider  atorvastatin (LIPITOR) 10 MG tablet Take 10 mg by mouth every evening.    Yes Historical Provider, MD  ferrous sulfate 325 (65 FE) MG tablet Take 650 mg by mouth 2 (two) times daily with a meal.   Yes Historical Provider, MD  glipiZIDE (GLUCOTROL) 10 MG tablet Take 10 mg by mouth 2 (two) times daily before a meal.     Yes Historical Provider, MD  insulin aspart (NOVOLOG) 100 UNIT/ML injection Inject 16 Units into the skin every morning. Patient takes based on sugar levels in the morning.   Yes Historical Provider, MD  LEVEMIR FLEXTOUCH 100 UNIT/ML Pen Inject 50 Units into the skin at bedtime.  06/20/13  Yes Historical Provider, MD  lisinopril (PRINIVIL,ZESTRIL) 5 MG tablet Take 5 mg by mouth daily.   Yes Historical Provider, MD  polyethylene glycol (MIRALAX / GLYCOLAX) packet Take 17 g by mouth daily.   Yes Historical Provider, MD  levofloxacin (LEVAQUIN) 500 MG tablet Take 1 tablet (500 mg total) by mouth daily. Patient not taking: Reported on 03/02/2014 02/28/14   Hoy Morn, MD  oxyCODONE-acetaminophen (PERCOCET/ROXICET) 5-325 MG per tablet Take 1 tablet by  mouth every 4 (four) hours as needed for severe pain. Patient not taking: Reported on 02/15/2014 05/25/14   Delora Fuel, MD   Physical Exam: Filed Vitals:   03/02/14 2155 03/02/14 2211 03/02/14 2215 03/02/14 2300  BP: 101/68   156/85  Pulse: 97   114  Temp:  98.6 F (37 C) 98.6 F (37 C) 98.6 F (37 C)  TempSrc:    Oral  Resp: 17   18  Weight:    52.4 kg (115 lb 8.3 oz)  SpO2: 97%   100%    Wt Readings from Last 3 Encounters:  03/02/14 52.4 kg (115 lb 8.3 oz)  02/28/14 55.339 kg (122 lb)  02/15/14 55.339 kg (122 lb)    General: Thin and cachectic  Eyes: L eye w/  minimal movement and milky, R eye EOMI ENT: dry mm, Speech is very guttural and difficult to understand Neck: Large bilat lymphadenopathy Cardiovascular:  RRR, no m/r/g. No LE edema. Telemetry:  SR, no arrhythmias  Respiratory: Course breath sounds bilat w/ R>L, nml effort  Abdomen:  soft, ntnd Skin: no rash or induration seen on limited exam Musculoskeletal:  grossly normal tone BUE/BLE Psychiatric:  grossly normal mood and affect, speech fluent and appropriate Neurologic:  grossly non-focal.          Labs on Admission:  Basic Metabolic Panel:  Recent Labs Lab 02/28/14 1435 03/02/14 2023  NA 141 133*  K 2.8* 3.3*  CL 98 93*  CO2 29 28  GLUCOSE 92 288*  BUN 8 5*  CREATININE 0.59 0.61  CALCIUM 8.8 8.7   Liver Function Tests: No results for input(s): AST, ALT, ALKPHOS, BILITOT, PROT, ALBUMIN in the last 168 hours. No results for input(s): LIPASE, AMYLASE in the last 168 hours. No results for input(s): AMMONIA in the last 168 hours. CBC:  Recent Labs Lab 02/28/14 1435 03/02/14 2023  WBC 18.5* 14.0*  NEUTROABS 16.1* 11.4*  HGB 10.4* 9.6*  HCT 33.8* 30.5*  MCV 69.5* 68.2*  PLT 505* 379   Cardiac Enzymes: No results for input(s): CKTOTAL, CKMB, CKMBINDEX, TROPONINI in the last 168 hours.  BNP (last 3 results) No results for input(s): PROBNP in the last 8760 hours. CBG:  Recent Labs Lab 02/28/14 1233 02/28/14 1301 02/28/14 1632 02/28/14 1651 03/02/14 1808  GLUCAP 89 109* 15* 136* 325*    Radiological Exams on Admission: Dg Chest 2 View  03/02/2014   CLINICAL DATA:  Choking on food denied  EXAM: CHEST  2 VIEW  COMPARISON:  02/28/2014  FINDINGS: Right hemidiaphragm remains elevated. Nodular density now projects over the inferior right hilum. Hazy airspace disease at the right base. Left lung is clear. Chronic right-sided rib deformities.  IMPRESSION: Hazy right lower lobe airspace disease.  New nodular density at the right lung base is indeterminate. Followup  studies to ensure resolution are recommended. If the abnormality fails to resolve, CT is recommended.   Electronically Signed   By: Maryclare Bean M.D.   On: 03/02/2014 20:03    EKG: Ordered  Assessment/Plan Active Problems:   HTN (hypertension)   CAP (community acquired pneumonia)   Diabetes type 2, uncontrolled   Hyperlipidemia   Dysphagia   Hypokalemia   Homelessness  CAP: CXR showing possible pneumonia or other pathology. H/o smoking and dysphagia w/ nodular appearance on CXR concerning for malignancy. WBC 14. - Admit - start [place] - CT w/ w/o contrast - Neck and chest.  Dysphagia: Pt endorses dysphagia for solids recently. ?? Aspirated  prior to coming to ED today. Failed bedside swallow in ED.  - NPO - GI consult in am (may change to ENT base on CT.)  - +/- swallow study  Hypokalemia:  - KCL 34mEq x4 (unable swallow) - Mag  HTN: normotensive - continue LIsinopril  DM: pt reports taking his sugar pill twice a day but no insulin. Last A1c 2012 18.1 - hold home glipizide - SSI - A1c  Homelessness:  - social work consult  HLD:  - continue statin  Hyponatremia: corrected Na of 133 due to hyperglycemia is 136.  - BMET in am     Code Status: FULL DVT Prophylaxis: Hep Family Communication: none Disposition Plan: pending workup    MERRELL, [facility] [email or web address] Password [email or web address]

## 2014-03-02 NOTE — ED Provider Notes (Signed)
CSN: [id number]     Arrival date & time 03/02/14  1708 History   First MD Initiated Contact with Patient 03/02/14 2015     Chief Complaint  Patient presents with  . Choking      HPI Pt was seen at 2015. Per EMS and pt report, c/o sudden onset and resolution of one episode of "coughing" and "choking on food" that occurred PTA. Pt's neighbor told EMS to have pt "go to the hospital to get checked out." Pt states he "knows the food went down the wrong pipe." States he tried to vomit while he was choking "and that didn't work." Denies CP/SOB, no abd pain, no fevers, no syncope/near syncope, no apnea.    Past Medical History  Diagnosis Date  . Diabetes mellitus   . Liver disease   . Enlarged prostate   . Hernia   . Malignant neoplasm of liver   . Alcohol dependence   . Seizures    Past Surgical History  Procedure Laterality Date  . Appendectomy     Family History  Problem Relation Age of Onset  . Diabetes Mother   . Hypertension Mother   . Cancer Father   . Stomach cancer Father   . Cancer Sister   . Hypertension Other    History  Substance Use Topics  . Smoking status: Current Every Day Smoker -- 1.50 packs/day for 40 years    Types: Cigarettes  . Smokeless tobacco: Never Used  . Alcohol Use: No    Review of Systems ROS: Statement: All systems negative except as marked or noted in the HPI; Constitutional: Negative for fever and chills. ; ; Eyes: Negative for eye pain, redness and discharge. ; ; ENMT: Negative for ear pain, hoarseness, nasal congestion, sinus pressure and sore throat. ; ; Cardiovascular: Negative for chest pain, palpitations, diaphoresis, dyspnea and peripheral edema. ; ; Respiratory: +cough, "choked." Negative for wheezing and stridor. ; ; Gastrointestinal: Negative for nausea, vomiting, diarrhea, abdominal pain, blood in stool, hematemesis, jaundice and rectal bleeding. . ; ; Genitourinary: Negative for dysuria, flank pain and hematuria. ; ; Musculoskeletal:  Negative for back pain and neck pain. Negative for swelling and trauma.; ; Skin: Negative for pruritus, rash, abrasions, blisters, bruising and skin lesion.; ; Neuro: Negative for headache, lightheadedness and neck stiffness. Negative for weakness, altered level of consciousness , altered mental status, extremity weakness, paresthesias, involuntary movement, seizure and syncope.      Allergies  Review of patient's allergies indicates no known allergies.  Home Medications   Prior to Admission medications   Medication Sig Start Date End Date Taking? Authorizing Provider  atorvastatin (LIPITOR) 10 MG tablet Take 10 mg by mouth every evening.    Yes Historical Provider, MD  ferrous sulfate 325 (65 FE) MG tablet Take 650 mg by mouth 2 (two) times daily with a meal.   Yes Historical Provider, MD  glipiZIDE (GLUCOTROL) 10 MG tablet Take 10 mg by mouth 2 (two) times daily before a meal.     Yes Historical Provider, MD  insulin aspart (NOVOLOG) 100 UNIT/ML injection Inject 16 Units into the skin every morning. Patient takes based on sugar levels in the morning.   Yes Historical Provider, MD  LEVEMIR FLEXTOUCH 100 UNIT/ML Pen Inject 50 Units into the skin at bedtime.  06/20/13  Yes Historical Provider, MD  lisinopril (PRINIVIL,ZESTRIL) 5 MG tablet Take 5 mg by mouth daily.   Yes Historical Provider, MD  polyethylene glycol (MIRALAX / GLYCOLAX) packet Take 17  g by mouth daily.   Yes Historical Provider, MD  levofloxacin (LEVAQUIN) 500 MG tablet Take 1 tablet (500 mg total) by mouth daily. Patient not taking: Reported on 03/02/2014 02/28/14   Hoy Morn, MD  oxyCODONE-acetaminophen (PERCOCET/ROXICET) 5-325 MG per tablet Take 1 tablet by mouth every 4 (four) hours as needed for severe pain. Patient not taking: Reported on 02/15/2014 10/19/48   Delora Fuel, MD   BP 539/76 mmHg  Pulse 97  Temp(Src) 98.7 F (37.1 C) (Oral)  Resp 17  SpO2 97% Physical Exam 2120: Physical examination:  Nursing notes  reviewed; Vital signs and O2 SAT reviewed;  Constitutional: Well developed, Well nourished, Well hydrated, In no acute distress; Head:  Normocephalic, atraumatic; Eyes: EOMI, PERRL, No scleral icterus; ENMT: Mouth and pharynx normal, Mucous membranes moist; Neck: Supple, Full range of motion, No lymphadenopathy; Cardiovascular: Regular rate and rhythm, No gallop; Respiratory: Breath sounds coarse & equal bilaterally, No wheezes.  Speaking full sentences with ease, Normal respiratory effort/excursion; Chest: Nontender, Movement normal; Abdomen: Soft, Nontender, Nondistended, Normal bowel sounds; Genitourinary: No CVA tenderness; Extremities: Pulses normal, No tenderness, No edema, No calf edema or asymmetry.; Neuro: AA&Ox3, Major CN grossly intact.  Speech clear. No gross focal motor or sensory deficits in extremities.; Skin: Color normal, Warm, Dry.   ED Course  Procedures     EKG Interpretation None      MDM  MDM Reviewed: previous chart, nursing note and vitals Reviewed previous: labs Interpretation: labs and x-ray     Results for orders placed or performed during the hospital encounter of 73/41/93  Basic metabolic panel  Result Value Ref Range   Sodium 133 (L) 137 - 147 mEq/L   Potassium 3.3 (L) 3.7 - 5.3 mEq/L   Chloride 93 (L) 96 - 112 mEq/L   CO2 28 19 - 32 mEq/L   Glucose, Bld 288 (H) 70 - 99 mg/dL   BUN 5 (L) 6 - 23 mg/dL   Creatinine, Ser 0.61 0.50 - 1.35 mg/dL   Calcium 8.7 8.4 - 10.5 mg/dL   GFR calc non Af Amer >90 >90 mL/min   GFR calc Af Amer >90 >90 mL/min   Anion gap 12 5 - 15  CBC with Differential  Result Value Ref Range   WBC 14.0 (H) 4.0 - 10.5 K/uL   RBC 4.47 4.22 - 5.81 MIL/uL   Hemoglobin 9.6 (L) 13.0 - 17.0 g/dL   HCT 30.5 (L) 39.0 - 52.0 %   MCV 68.2 (L) 78.0 - 100.0 fL   MCH 21.5 (L) 26.0 - 34.0 pg   MCHC 31.5 30.0 - 36.0 g/dL   RDW 16.3 (H) 11.5 - 15.5 %   Platelets 379 150 - 400 K/uL   Neutrophils Relative % 81 (H) 43 - 77 %   Lymphocytes  Relative 13 12 - 46 %   Monocytes Relative 5 3 - 12 %   Eosinophils Relative 1 0 - 5 %   Basophils Relative 0 0 - 1 %   Neutro Abs 11.4 (H) 1.7 - 7.7 K/uL   Lymphs Abs 1.8 0.7 - 4.0 K/uL   Monocytes Absolute 0.7 0.1 - 1.0 K/uL   Eosinophils Absolute 0.1 0.0 - 0.7 K/uL   Basophils Absolute 0.0 0.0 - 0.1 K/uL   RBC Morphology ELLIPTOCYTES    WBC Morphology TOXIC GRANULATION   CBG monitoring, ED  Result Value Ref Range   Glucose-Capillary 325 (H) 70 - 99 mg/dL   Dg Chest 2 View 03/02/2014  CLINICAL DATA:  Choking on food denied  EXAM: CHEST  2 VIEW  COMPARISON:  02/28/2014  FINDINGS: Right hemidiaphragm remains elevated. Nodular density now projects over the inferior right hilum. Hazy airspace disease at the right base. Left lung is clear. Chronic right-sided rib deformities.  IMPRESSION: Hazy right lower lobe airspace disease.  New nodular density at the right lung base is indeterminate. Followup studies to ensure resolution are recommended. If the abnormality fails to resolve, CT is recommended.   Electronically Signed   By: Maryclare Bean M.D.   On: 03/02/2014 20:03    2125:  CXR today with RLL infiltrate. CXR 2 days ago with possibility of RLL infiltrate vs atelectasis. Will tx with IV levaquin for CAP. Dx and testing d/w pt.  Questions answered.  Verb understanding, agreeable to admit.   T/C to [facility] Dr. Marily Memos, case discussed, including:  HPI, pertinent PM/SHx, VS/PE, dx testing, ED course and treatment:  Agreeable to admit, requests to write temporary orders, obtain medical bed to team [facility]mits.     Francine Graven, DO 03/04/14 1623

## 2014-03-03 ENCOUNTER — Observation Stay (HOSPITAL_COMMUNITY): Payer: Medicare Other

## 2014-03-03 ENCOUNTER — Encounter (HOSPITAL_COMMUNITY): Payer: Self-pay | Admitting: Gastroenterology

## 2014-03-03 DIAGNOSIS — D509 Iron deficiency anemia, unspecified: Secondary | ICD-10-CM | POA: Diagnosis present

## 2014-03-03 DIAGNOSIS — E11649 Type 2 diabetes mellitus with hypoglycemia without coma: Secondary | ICD-10-CM | POA: Diagnosis present

## 2014-03-03 DIAGNOSIS — R591 Generalized enlarged lymph nodes: Secondary | ICD-10-CM | POA: Diagnosis present

## 2014-03-03 DIAGNOSIS — Z833 Family history of diabetes mellitus: Secondary | ICD-10-CM | POA: Diagnosis not present

## 2014-03-03 DIAGNOSIS — E780 Pure hypercholesterolemia: Secondary | ICD-10-CM | POA: Diagnosis present

## 2014-03-03 DIAGNOSIS — Z8505 Personal history of malignant neoplasm of liver: Secondary | ICD-10-CM | POA: Diagnosis not present

## 2014-03-03 DIAGNOSIS — D490 Neoplasm of unspecified behavior of digestive system: Secondary | ICD-10-CM | POA: Diagnosis present

## 2014-03-03 DIAGNOSIS — Z681 Body mass index (BMI) 19 or less, adult: Secondary | ICD-10-CM | POA: Diagnosis not present

## 2014-03-03 DIAGNOSIS — J690 Pneumonitis due to inhalation of food and vomit: Secondary | ICD-10-CM | POA: Diagnosis present

## 2014-03-03 DIAGNOSIS — K7030 Alcoholic cirrhosis of liver without ascites: Secondary | ICD-10-CM | POA: Diagnosis present

## 2014-03-03 DIAGNOSIS — Z792 Long term (current) use of antibiotics: Secondary | ICD-10-CM | POA: Diagnosis not present

## 2014-03-03 DIAGNOSIS — K8020 Calculus of gallbladder without cholecystitis without obstruction: Secondary | ICD-10-CM | POA: Diagnosis present

## 2014-03-03 DIAGNOSIS — F1010 Alcohol abuse, uncomplicated: Secondary | ICD-10-CM | POA: Diagnosis present

## 2014-03-03 DIAGNOSIS — I8500 Esophageal varices without bleeding: Secondary | ICD-10-CM | POA: Diagnosis present

## 2014-03-03 DIAGNOSIS — R05 Cough: Secondary | ICD-10-CM | POA: Diagnosis present

## 2014-03-03 DIAGNOSIS — E222 Syndrome of inappropriate secretion of antidiuretic hormone: Secondary | ICD-10-CM | POA: Diagnosis present

## 2014-03-03 DIAGNOSIS — K2210 Ulcer of esophagus without bleeding: Secondary | ICD-10-CM | POA: Diagnosis present

## 2014-03-03 DIAGNOSIS — N132 Hydronephrosis with renal and ureteral calculous obstruction: Secondary | ICD-10-CM | POA: Diagnosis present

## 2014-03-03 DIAGNOSIS — C799 Secondary malignant neoplasm of unspecified site: Secondary | ICD-10-CM | POA: Diagnosis present

## 2014-03-03 DIAGNOSIS — K5900 Constipation, unspecified: Secondary | ICD-10-CM | POA: Diagnosis present

## 2014-03-03 DIAGNOSIS — D649 Anemia, unspecified: Secondary | ICD-10-CM | POA: Diagnosis present

## 2014-03-03 DIAGNOSIS — Z720 Tobacco use: Secondary | ICD-10-CM | POA: Diagnosis present

## 2014-03-03 DIAGNOSIS — Z8249 Family history of ischemic heart disease and other diseases of the circulatory system: Secondary | ICD-10-CM | POA: Diagnosis not present

## 2014-03-03 DIAGNOSIS — C770 Secondary and unspecified malignant neoplasm of lymph nodes of head, face and neck: Secondary | ICD-10-CM | POA: Diagnosis present

## 2014-03-03 DIAGNOSIS — Z800 Family history of malignant neoplasm of digestive organs: Secondary | ICD-10-CM | POA: Diagnosis not present

## 2014-03-03 DIAGNOSIS — D638 Anemia in other chronic diseases classified elsewhere: Secondary | ICD-10-CM | POA: Diagnosis present

## 2014-03-03 DIAGNOSIS — E785 Hyperlipidemia, unspecified: Secondary | ICD-10-CM | POA: Diagnosis present

## 2014-03-03 DIAGNOSIS — R590 Localized enlarged lymph nodes: Secondary | ICD-10-CM | POA: Insufficient documentation

## 2014-03-03 DIAGNOSIS — K449 Diaphragmatic hernia without obstruction or gangrene: Secondary | ICD-10-CM | POA: Diagnosis present

## 2014-03-03 DIAGNOSIS — D122 Benign neoplasm of ascending colon: Secondary | ICD-10-CM | POA: Diagnosis present

## 2014-03-03 DIAGNOSIS — I10 Essential (primary) hypertension: Secondary | ICD-10-CM | POA: Diagnosis present

## 2014-03-03 DIAGNOSIS — F17210 Nicotine dependence, cigarettes, uncomplicated: Secondary | ICD-10-CM | POA: Diagnosis present

## 2014-03-03 DIAGNOSIS — Z79891 Long term (current) use of opiate analgesic: Secondary | ICD-10-CM | POA: Diagnosis not present

## 2014-03-03 DIAGNOSIS — R0902 Hypoxemia: Secondary | ICD-10-CM | POA: Diagnosis present

## 2014-03-03 DIAGNOSIS — K4090 Unilateral inguinal hernia, without obstruction or gangrene, not specified as recurrent: Secondary | ICD-10-CM | POA: Diagnosis present

## 2014-03-03 DIAGNOSIS — R7881 Bacteremia: Secondary | ICD-10-CM | POA: Diagnosis present

## 2014-03-03 DIAGNOSIS — R221 Localized swelling, mass and lump, neck: Secondary | ICD-10-CM | POA: Diagnosis present

## 2014-03-03 DIAGNOSIS — Z794 Long term (current) use of insulin: Secondary | ICD-10-CM | POA: Diagnosis not present

## 2014-03-03 DIAGNOSIS — R569 Unspecified convulsions: Secondary | ICD-10-CM | POA: Diagnosis present

## 2014-03-03 DIAGNOSIS — C801 Malignant (primary) neoplasm, unspecified: Secondary | ICD-10-CM | POA: Diagnosis present

## 2014-03-03 DIAGNOSIS — N400 Enlarged prostate without lower urinary tract symptoms: Secondary | ICD-10-CM | POA: Diagnosis present

## 2014-03-03 DIAGNOSIS — E43 Unspecified severe protein-calorie malnutrition: Secondary | ICD-10-CM | POA: Diagnosis present

## 2014-03-03 DIAGNOSIS — C787 Secondary malignant neoplasm of liver and intrahepatic bile duct: Secondary | ICD-10-CM | POA: Diagnosis present

## 2014-03-03 DIAGNOSIS — R160 Hepatomegaly, not elsewhere classified: Secondary | ICD-10-CM | POA: Diagnosis present

## 2014-03-03 DIAGNOSIS — E1165 Type 2 diabetes mellitus with hyperglycemia: Secondary | ICD-10-CM | POA: Diagnosis present

## 2014-03-03 DIAGNOSIS — K210 Gastro-esophageal reflux disease with esophagitis: Secondary | ICD-10-CM | POA: Diagnosis present

## 2014-03-03 DIAGNOSIS — E876 Hypokalemia: Secondary | ICD-10-CM | POA: Diagnosis present

## 2014-03-03 DIAGNOSIS — K648 Other hemorrhoids: Secondary | ICD-10-CM | POA: Diagnosis present

## 2014-03-03 DIAGNOSIS — K7460 Unspecified cirrhosis of liver: Secondary | ICD-10-CM | POA: Diagnosis present

## 2014-03-03 LAB — HIV ANTIBODY (ROUTINE TESTING W REFLEX): HIV: NONREACTIVE

## 2014-03-03 LAB — GLUCOSE, CAPILLARY
GLUCOSE-CAPILLARY: 59 mg/dL — AB (ref 70–99)
GLUCOSE-CAPILLARY: 181 mg/dL — AB (ref 70–99)
GLUCOSE-CAPILLARY: 304 mg/dL — AB (ref 70–99)
Glucose-Capillary: 128 mg/dL — ABNORMAL HIGH (ref 70–99)
Glucose-Capillary: 130 mg/dL — ABNORMAL HIGH (ref 70–99)

## 2014-03-03 LAB — COMPREHENSIVE METABOLIC PANEL
ALT: 6 U/L (ref 0–53)
AST: 13 U/L (ref 0–37)
Albumin: 2.1 g/dL — ABNORMAL LOW (ref 3.5–5.2)
Alkaline Phosphatase: 146 U/L — ABNORMAL HIGH (ref 39–117)
Anion gap: 10 (ref 5–15)
BILIRUBIN TOTAL: 0.3 mg/dL (ref 0.3–1.2)
BUN: 4 mg/dL — ABNORMAL LOW (ref 6–23)
CALCIUM: 8.1 mg/dL — AB (ref 8.4–10.5)
CHLORIDE: 100 meq/L (ref 96–112)
CO2: 28 meq/L (ref 19–32)
CREATININE: 0.55 mg/dL (ref 0.50–1.35)
GFR calc non Af Amer: >90 mL/min (ref >90)
GLUCOSE: 146 mg/dL — AB (ref 70–99)
Potassium: 4.1 mEq/L (ref 3.7–5.3)
Sodium: 138 mEq/L (ref 137–147)
Total Protein: 6.1 g/dL (ref 6.0–8.3)

## 2014-03-03 LAB — IRON AND TIBC
IRON: 14 ug/dL — AB (ref 42–135)
SATURATION RATIOS: 7 % — AB (ref 20–55)
TIBC: 192 ug/dL — ABNORMAL LOW (ref 215–435)
UIBC: 178 ug/dL (ref 125–400)

## 2014-03-03 LAB — FOLATE: Folate: 11.3 ng/mL

## 2014-03-03 LAB — CBC
HCT: 26.2 % — ABNORMAL LOW (ref 39.0–52.0)
HEMOGLOBIN: 8.2 g/dL — AB (ref 13.0–17.0)
MCH: 21.4 pg — ABNORMAL LOW (ref 26.0–34.0)
MCHC: 31.3 g/dL (ref 30.0–36.0)
MCV: 68.2 fL — AB (ref 78.0–100.0)
Platelets: 385 10*3/uL (ref 150–400)
RBC: 3.84 MIL/uL — AB (ref 4.22–5.81)
RDW: 16.3 % — ABNORMAL HIGH (ref 11.5–15.5)
WBC: 11 10*3/uL — ABNORMAL HIGH (ref 4.0–10.5)

## 2014-03-03 LAB — FERRITIN: Ferritin: 36 ng/mL (ref 22–322)

## 2014-03-03 LAB — VITAMIN B12: Vitamin B-12: 586 pg/mL (ref 211–911)

## 2014-03-03 LAB — MRSA PCR SCREENING: MRSA by PCR: NEGATIVE

## 2014-03-03 LAB — RETICULOCYTES
RBC.: 3.78 MIL/uL — AB (ref 4.22–5.81)
RETIC COUNT ABSOLUTE: 41.6 10*3/uL (ref 19.0–186.0)
Retic Ct Pct: 1.1 % (ref 0.4–3.1)

## 2014-03-03 LAB — MAGNESIUM: Magnesium: 1.3 mg/dL — ABNORMAL LOW (ref 1.5–2.5)

## 2014-03-03 LAB — STREP PNEUMONIAE URINARY ANTIGEN: Strep Pneumo Urinary Antigen: NEGATIVE

## 2014-03-03 MED ORDER — DEXTROSE 50 % IV SOLN
25.0000 mL | Freq: Once | INTRAVENOUS | Status: AC
  Administered 2014-03-03: 25 mL via INTRAVENOUS

## 2014-03-03 MED ORDER — INSULIN ASPART 100 UNIT/ML ~~LOC~~ SOLN
4.0000 [IU] | Freq: Once | SUBCUTANEOUS | Status: AC
  Administered 2014-03-03: 4 [IU] via SUBCUTANEOUS

## 2014-03-03 MED ORDER — VANCOMYCIN HCL IN DEXTROSE 1-5 GM/200ML-% IV SOLN
INTRAVENOUS | Status: AC
  Filled 2014-03-03: qty 200

## 2014-03-03 MED ORDER — POTASSIUM CHLORIDE IN NACL 20-0.9 MEQ/L-% IV SOLN
INTRAVENOUS | Status: DC
  Administered 2014-03-03 – 2014-03-06 (×3): via INTRAVENOUS

## 2014-03-03 MED ORDER — PANTOPRAZOLE SODIUM 40 MG PO TBEC
40.0000 mg | DELAYED_RELEASE_TABLET | Freq: Every day | ORAL | Status: DC
  Administered 2014-03-04: 40 mg via ORAL
  Filled 2014-03-03: qty 1

## 2014-03-03 MED ORDER — DEXTROSE 50 % IV SOLN
INTRAVENOUS | Status: AC
  Administered 2014-03-03: 12:00:00
  Filled 2014-03-03: qty 50

## 2014-03-03 MED ORDER — VANCOMYCIN HCL IN DEXTROSE 750-5 MG/150ML-% IV SOLN
750.0000 mg | Freq: Two times a day (BID) | INTRAVENOUS | Status: DC
Start: 2014-03-04 — End: 2014-03-05
  Administered 2014-03-04 – 2014-03-05 (×3): 750 mg via INTRAVENOUS
  Filled 2014-03-03 (×5): qty 150

## 2014-03-03 MED ORDER — IOHEXOL 300 MG/ML  SOLN
80.0000 mL | Freq: Once | INTRAMUSCULAR | Status: AC | PRN
  Administered 2014-03-03: 80 mL via INTRAVENOUS

## 2014-03-03 MED ORDER — LEVALBUTEROL HCL 0.63 MG/3ML IN NEBU: 0.6300 mg | INHALATION_SOLUTION | Freq: Four times a day (QID) | RESPIRATORY_TRACT | Status: DC | PRN

## 2014-03-03 MED ORDER — VANCOMYCIN HCL IN DEXTROSE 1-5 GM/200ML-% IV SOLN
1000.0000 mg | Freq: Once | INTRAVENOUS | Status: AC
  Administered 2014-03-03: 1000 mg via INTRAVENOUS
  Filled 2014-03-03: qty 200

## 2014-03-03 MED ORDER — SODIUM CHLORIDE 0.9 % IJ SOLN
INTRAMUSCULAR | Status: AC
  Administered 2014-03-03: 12:00:00
  Filled 2014-03-03: qty 500

## 2014-03-03 MED ORDER — PANTOPRAZOLE SODIUM 40 MG IV SOLR
40.0000 mg | Freq: Two times a day (BID) | INTRAVENOUS | Status: DC
  Administered 2014-03-03: 40 mg via INTRAVENOUS
  Filled 2014-03-03: qty 40

## 2014-03-03 MED ORDER — LEVALBUTEROL HCL 0.63 MG/3ML IN NEBU
0.6300 mg | INHALATION_SOLUTION | Freq: Three times a day (TID) | RESPIRATORY_TRACT | Status: DC
  Administered 2014-03-04 – 2014-03-10 (×18): 0.63 mg via RESPIRATORY_TRACT
  Filled 2014-03-03 (×21): qty 3

## 2014-03-03 MED ORDER — GLUCERNA SHAKE PO LIQD
237.0000 mL | Freq: Three times a day (TID) | ORAL | Status: DC
  Administered 2014-03-03 – 2014-03-10 (×15): 237 mL via ORAL

## 2014-03-03 MED ORDER — INSULIN DETEMIR 100 UNIT/ML ~~LOC~~ SOLN
25.0000 [IU] | Freq: Every day | SUBCUTANEOUS | Status: DC
  Filled 2014-03-03 (×2): qty 0.25

## 2014-03-03 MED ORDER — MAGNESIUM SULFATE 2 GM/50ML IV SOLN
2.0000 g | Freq: Once | INTRAVENOUS | Status: AC
  Administered 2014-03-03: 2 g via INTRAVENOUS
  Filled 2014-03-03: qty 50

## 2014-03-03 MED ORDER — LEVALBUTEROL HCL 0.63 MG/3ML IN NEBU
0.6300 mg | INHALATION_SOLUTION | Freq: Four times a day (QID) | RESPIRATORY_TRACT | Status: DC
  Administered 2014-03-03: 0.63 mg via RESPIRATORY_TRACT
  Filled 2014-03-03: qty 3

## 2014-03-03 NOTE — Progress Notes (Signed)
Lab called with positive blood culture results.Aerobic gram positive cocci in clusters. MD paged with results.

## 2014-03-03 NOTE — Progress Notes (Signed)
ANTIBIOTIC CONSULT NOTE  Pharmacy Consult for Levaquin (renal dose adjustments) Indication: pneumonia  No Known Allergies  Patient Measurements: Height: 6' (182.9 cm) Weight: 115 lb 8.3 oz (52.4 kg) IBW/kg (Calculated) : 77.6  Vital Signs: Temp: 98.8 F (37.1 C) (12/15 0559) Temp Source: Oral (12/15 0559) BP: 146/70 mmHg (12/15 0559) Pulse Rate: 102 (12/15 0559) Intake/Output from previous day: 12/14 0701 - 12/15 0700 In: 1023.3 [I.V.:623.3; IV Piggyback:400] Out: 200 [Urine:200] Intake/Output from this shift:    Labs:  Recent Labs  02/28/14 1435 03/02/14 2023 03/03/14 0549  WBC 18.5* 14.0* 11.0*  HGB 10.4* 9.6* 8.2*  PLT 505* 379 385  CREATININE 0.59 0.61 0.55   Estimated Creatinine Clearance: 68.2 mL/min (by C-G formula based on Cr of 0.55). No results for input(s): VANCOTROUGH, VANCOPEAK, VANCORANDOM, GENTTROUGH, GENTPEAK, GENTRANDOM, TOBRATROUGH, TOBRAPEAK, TOBRARND, AMIKACINPEAK, AMIKACINTROU, AMIKACIN in the last 72 hours.   Microbiology: Recent Results (from the past 720 hour(s))  Culture, blood (routine x 2) Call MD if unable to obtain prior to antibiotics being given     Status: None (Preliminary result)   Collection Time: 03/02/14 11:23 PM  Result Value Ref Range Status   Specimen Description BLOOD RIGHT ANTECUBITAL  Final   Special Requests BOTTLES DRAWN AEROBIC AND ANAEROBIC Rodney  Final   Culture PENDING  Incomplete   Report Status PENDING  Incomplete  Culture, blood (routine x 2) Call MD if unable to obtain prior to antibiotics being given     Status: None (Preliminary result)   Collection Time: 03/02/14 11:23 PM  Result Value Ref Range Status   Specimen Description BLOOD RIGHT ANTECUBITAL  Final   Special Requests   Final    BOTTLES DRAWN AEROBIC AND ANAEROBIC AEB 6CC ANA 4CC   Culture PENDING  Incomplete   Report Status PENDING  Incomplete  MRSA PCR Screening     Status: None   Collection Time: 03/03/14 12:10 AM  Result Value Ref Range  Status   MRSA by PCR NEGATIVE NEGATIVE Final    Comment:        The GeneXpert MRSA Assay (FDA approved for NASAL specimens only), is one component of a comprehensive MRSA colonization surveillance program. It is not intended to diagnose MRSA infection nor to guide or monitor treatment for MRSA infections.     Anti-infectives    Start     Dose/Rate Route Frequency Ordered Stop   03/03/14 2300  levofloxacin (LEVAQUIN) IVPB 750 mg     750 mg100 mL/hr over 90 Minutes Intravenous Every 24 hours 03/02/14 2307 03/08/14 2259   03/02/14 2130  levofloxacin (LEVAQUIN) IVPB 750 mg     750 mg100 mL/hr over 90 Minutes Intravenous  Once 03/02/14 2126 03/02/14 2341      Assessment: 80 yoM who presented with cough, SOB, dysphagia.  CXR shows possible PNA- chest CT pending.  He is afebrile with mild leukocytosis.   He was seen in ED in past week with similar sx, but never picked up prescribed antibiotic from pharmacy.  He failed initial swallow eval therefore concern for new aspiration.  Renal function stable at patient's baseline.   Levaquin 12/14  Goal of Therapy:  Eradicate infection.  Plan:  Continue Levaquin 750mg  IV q24h Monitor renal function, cx data, and clinical course  Biagio Borg 03/03/2014,7:33 AM

## 2014-03-03 NOTE — Progress Notes (Signed)
Inpatient Diabetes Program Recommendations  AACE/ADA: New Consensus Statement on Inpatient Glycemic Control (2013)  Target Ranges:  Prepandial:   less than 140 mg/dL      Peak postprandial:   less than 180 mg/dL (1-2 hours)      Critically ill patients:  140 - 180 mg/dL   Results for [name], [name] (MRN [id number]) as of 03/03/2014 13:58  Ref. Range 03/02/2014 18:08 03/02/2014 23:24 03/03/2014 07:11 03/03/2014 11:35 03/03/2014 12:23  Glucose-Capillary Latest Range: 70-99 mg/dL 325 (H) 288 (H) 128 (H) 59 (L) 130 (H)   Diabetes history: DM2 Outpatient Diabetes medications: Glipizide 10 mg BID, Levemir 50 units QHS, Novolog 16 units QAM (Noted patient reports that he has not been taking insulin. He is only taking oral DM medication) Current orders for Inpatient glycemic control: Levemir 25 units daily, Novolog 0-9 units AC  Inpatient Diabetes Program Recommendations Insulin - Basal: Noted Levemir 25 units daily was ordered today. However patient has not yet received Levemir today. CGG down to 59 mg/dl at 11:35 am.  Please consider discontinuing Levemir at this time and continue to evaluate need for basal insulin.  Note: Initial CBG noted to be 325 mg/dl on 12/14 at 18:08 and fasting 128 mg/dl this morning without receiving any insulin last night. Patient received Novolog 1 unit at 8:40 am this morning for CBG of 128 mg/dl and following CBG at 11:35 is down to 59 mg/dl. Levemir 25 units daily was ordered today but has not bee given. Please consider discontinuing Levemir at this time and continue to evaluate if basal insulin is needed.  Thanks, Barnie Alderman, RN, MSN, CCRN, CDE Diabetes Coordinator Inpatient Diabetes Program [phone] (Team Pager) [phone] ([facility] office) [phone] [facility] office)

## 2014-03-03 NOTE — Progress Notes (Signed)
[facility] PROGRESS NOTE  [name] ERD:[id number] DOB: [birth year] DOA: 03/02/2014 PCP: [name], MD  Assessment/Plan: CAP: CXR showing possible pneumonia or other pathology right lower lobe. Patient describes several month hx of solid food dysphagia. ?aspiration.  H/o smoking w/ nodular appearance on CXR concerning for malignancy. Await results CT of neck and chest. Continue Levaquin day #2. Await legionella urine antigen and strep pneumo urine antigen. Blood cultures with no growth to date.  Currently he is afebrile and non-toxic appearing.   Dysphagia: Pt endorses dysphagia for solids recently. Reports choking on barbeque. Sister reports not eating over last 3 months. Reports weight loss.  Failed bedside swallow in ED. Continue NPO. Evaluated by GI who recommend abdominal US and opine he will need EGD this admission. Concern for malignancy given his history. CT neck and chest results pending.   Anemia: microcytic. Await anemia panel. Chart review indicates hx of same and on iron supplements at home but current Hg much lower than in past. Trending down over last 24 hours which may be dilutional. May be related to decreased po intake due to #2. Will check FOBT. Appreciate GI assistance.   Hypokalemia: KCL 47mEq x4 (unable swallow). Resolved this am. Magnesium level 1.3. Will replete and monitor.   HTN: controlled. Continue LIsinopril  DM: pt reports taking his sugar pill twice a day but no insulin. Last A1c 2012 18.1. CBG range 128-288. Continue to hold glipizide and provide lantus at half home dose given NPO status. SSI for optimal control. Obtain  A1c  Homelessness: reports living with sister.   HLD:  continue statin  Hyponatremia: resolved. BMET in am    Code Status: full Family Communication: sister on phone  Disposition Plan: will likely need snf   Consultants:  GI  Procedures:    Antibiotics:  Levaquin 03/03/14>>>  HPI/Subjective: Alert.  Reports pain with swallowing. Denies any other discomfort.   Objective: Filed Vitals:   03/03/14 0559  BP: 146/70  Pulse: 102  Temp: 98.8 F (37.1 C)  Resp: 16    Intake/Output Summary (Last 24 hours) at 03/03/14 1153 Last data filed at 03/03/14 1101  Gross per 24 hour  Intake 1023.33 ml  Output    200 ml  Net 823.33 ml   Filed Weights   03/02/14 2300  Weight: 52.4 kg (115 lb 8.3 oz)    Exam:   General:  Thin frail   Cardiovascular: RRR No MGR No LE edema  Respiratory: normal effort BS distant and coarse throughout no wheeze no crackles  Abdomen: flat soft +BS non-tender  Musculoskeletal: no clubbing or cyanosis  Neck full ROM, +cervical lyphadenopathy mild tenderness to palpatin  HEENT: ears clear. Left eye with cataract  Data Reviewed: Basic Metabolic Panel:  Recent Labs Lab 02/28/14 1435 03/02/14 2023 03/03/14 0549 03/03/14 0841  NA 141 133* 138  --   K 2.8* 3.3* 4.1  --   CL 98 93* 100  --   CO2 29 28 28   --   GLUCOSE 92 288* 146*  --   BUN 8 5* 4*  --   CREATININE 0.59 0.61 0.55  --   CALCIUM 8.8 8.7 8.1*  --   MG  --   --   --  1.3*   Liver Function Tests:  Recent Labs Lab 03/03/14 0549  AST 13  ALT 6  ALKPHOS 146*  BILITOT 0.3  PROT 6.1  ALBUMIN 2.1*   No results for input(s): LIPASE, AMYLASE in the last 168  hours. No results for input(s): AMMONIA in the last 168 hours. CBC:  Recent Labs Lab 02/28/14 1435 03/02/14 2023 03/03/14 0549  WBC 18.5* 14.0* 11.0*  NEUTROABS 16.1* 11.4*  --   HGB 10.4* 9.6* 8.2*  HCT 33.8* 30.5* 26.2*  MCV 69.5* 68.2* 68.2*  PLT 505* 379 385   Cardiac Enzymes: No results for input(s): CKTOTAL, CKMB, CKMBINDEX, TROPONINI in the last 168 hours. BNP (last 3 results) No results for input(s): PROBNP in the last 8760 hours. CBG:  Recent Labs Lab 02/28/14 1632 02/28/14 1651 03/02/14 1808 03/02/14 2324 03/03/14 0711  GLUCAP 15* 136* 325* 288* 128*    Recent Results (from the past 240  hour(s))  Culture, blood (routine x 2) Call MD if unable to obtain prior to antibiotics being given     Status: None (Preliminary result)   Collection Time: 03/02/14 11:23 PM  Result Value Ref Range Status   Specimen Description BLOOD RIGHT ANTECUBITAL  Final   Special Requests BOTTLES DRAWN AEROBIC AND ANAEROBIC 6CC EACH  Final   Culture NO GROWTH 1 DAY  Final   Report Status PENDING  Incomplete  Culture, blood (routine x 2) Call MD if unable to obtain prior to antibiotics being given     Status: None (Preliminary result)   Collection Time: 03/02/14 11:23 PM  Result Value Ref Range Status   Specimen Description BLOOD RIGHT ANTECUBITAL  Final   Special Requests   Final    BOTTLES DRAWN AEROBIC AND ANAEROBIC AEB [place] ANA 4CC   Culture NO GROWTH 1 DAY  Final   Report Status PENDING  Incomplete  MRSA PCR Screening     Status: None   Collection Time: 03/03/14 12:10 AM  Result Value Ref Range Status   MRSA by PCR NEGATIVE NEGATIVE Final    Comment:        The GeneXpert MRSA Assay (FDA approved for NASAL specimens only), is one component of a comprehensive MRSA colonization surveillance program. It is not intended to diagnose MRSA infection nor to guide or monitor treatment for MRSA infections.      Studies: Dg Chest 2 View  03/02/2014   CLINICAL DATA:  Choking on food denied  EXAM: CHEST  2 VIEW  COMPARISON:  02/28/2014  FINDINGS: Right hemidiaphragm remains elevated. Nodular density now projects over the inferior right hilum. Hazy airspace disease at the right base. Left lung is clear. Chronic right-sided rib deformities.  IMPRESSION: Hazy right lower lobe airspace disease.  New nodular density at the right lung base is indeterminate. Followup studies to ensure resolution are recommended. If the abnormality fails to resolve, CT is recommended.   Electronically Signed   By: Maryclare Bean M.D.   On: 03/02/2014 20:03    Scheduled Meds: . atorvastatin  10 mg Oral QPM  . dextrose  25 mL  Intravenous Once  . heparin  5,000 Units Subcutaneous 3 times per day  . insulin aspart  0-9 Units Subcutaneous TID WC  . insulin detemir  25 Units Subcutaneous Daily  . levofloxacin (LEVAQUIN) IV  750 mg Intravenous Q24H  . lisinopril  5 mg Oral Daily  . magnesium sulfate 1 - 4 g bolus IVPB  2 g Intravenous Once  . pantoprazole (PROTONIX) IV  40 mg Intravenous Q12H   Continuous Infusions: . 0.9 % NaCl with KCl 20 mEq / L 75 mL/hr at 03/03/14 0840    Principal Problem:   CAP (community acquired pneumonia) Active Problems:   Seizure disorder   HTN (  hypertension)   Diabetes type 2, uncontrolled   Hyperlipidemia   Dysphagia   Hypokalemia   Homelessness   Hypoglycemia associated with diabetes   Cervical lymphadenopathy   Tobacco abuse   Anemia   Cirrhosis   Adenopathy, cervical   Hypomagnesemia    Time spent: 35 minutes    [facility] Pager [phone]. If 7PM-7AM, please contact night-coverage at [email or web address], password [facility] 03/03/2014, 11:53 AM  LOS: 1 day

## 2014-03-03 NOTE — Consult Note (Signed)
Referring Provider: Dr. Caryn Section  Primary Care Physician:  [name], MD Primary Gastroenterologist:  Dr. Gala Romney   Date of Admission: 03/02/14 Date of Consultation: 03/03/14  Reason for Consultation:  Dysphagia   HPI:  [name] is a 63 year old male, poor historian, presenting to the ED with episode of choking while eating BBQ.  States he presented to the ED because he was weak, legs weak, fell. Cough and shortness of breath noted several days ago and prescribed abx through ED; was unable to pick up prescription. States first episode of choking yesterday while eating BBQ. Intermittent solid food dysphagia for the past 4-5 months. Denies reflux symptoms. Takes his time when he eats. Denies odynophagia. Complains of neck discomfort intermittently, having to sleep a different way. Rare abdominal discomfort, vague, RLQ. Known large right inguinal/scrotal hernia. No changes in bowel habits. No melena or hematochezia. No prior colonoscopy. Miralax for constipation. Possible upper endoscopy per patient report; however, I am unable to find documentation in epic.  Notes states he is homeless; patient tells me he lives with his sister.   Past Medical History  Diagnosis Date  . Diabetes mellitus   . Liver disease   . Enlarged prostate   . Hernia   . Alcohol dependence   . Seizures   . Hypercholesterolemia     Past Surgical History  Procedure Laterality Date  . Appendectomy      Prior to Admission medications   Medication Sig Start Date End Date Taking? Authorizing Provider  atorvastatin (LIPITOR) 10 MG tablet Take 10 mg by mouth every evening.    Yes Historical Provider, MD  ferrous sulfate 325 (65 FE) MG tablet Take 650 mg by mouth 2 (two) times daily with a meal.   Yes Historical Provider, MD  glipiZIDE (GLUCOTROL) 10 MG tablet Take 10 mg by mouth 2 (two) times daily before a meal.     Yes Historical Provider, MD  insulin aspart (NOVOLOG) 100 UNIT/ML injection Inject 16 Units  into the skin every morning. Patient takes based on sugar levels in the morning.   Yes Historical Provider, MD  LEVEMIR FLEXTOUCH 100 UNIT/ML Pen Inject 50 Units into the skin at bedtime.  06/20/13  Yes Historical Provider, MD  lisinopril (PRINIVIL,ZESTRIL) 5 MG tablet Take 5 mg by mouth daily.   Yes Historical Provider, MD  polyethylene glycol (MIRALAX / GLYCOLAX) packet Take 17 g by mouth daily.   Yes Historical Provider, MD  levofloxacin (LEVAQUIN) 500 MG tablet Take 1 tablet (500 mg total) by mouth daily. Patient not taking: Reported on 03/02/2014 02/28/14   Hoy Morn, MD  oxyCODONE-acetaminophen (PERCOCET/ROXICET) 5-325 MG per tablet Take 1 tablet by mouth every 4 (four) hours as needed for severe pain. Patient not taking: Reported on 02/15/2014 [birth year]   Delora Fuel, MD    Current Facility-Administered Medications  Medication Dose Route Frequency Provider Last Rate Last Dose  . 0.9 % NaCl with KCl 20 mEq/ L  infusion   Intravenous Continuous Rexene Alberts, MD 75 mL/hr at 03/03/14 0840    . acetaminophen (TYLENOL) tablet 650 mg  650 mg Oral Q6H PRN Waldemar Dickens, MD       Or  . acetaminophen (TYLENOL) suppository 650 mg  650 mg Rectal Q6H PRN Waldemar Dickens, MD      . atorvastatin (LIPITOR) tablet 10 mg  10 mg Oral QPM Waldemar Dickens, MD      . heparin injection 5,000 Units  5,000 Units Subcutaneous 3  times per day Waldemar Dickens, MD   5,000 Units at 03/03/14 0601  . Influenza vac split quadrivalent PF (FLUARIX) injection 0.5 mL  0.5 mL Intramuscular Tomorrow-1000 Waldemar Dickens, MD      . insulin aspart (novoLOG) injection 0-9 Units  0-9 Units Subcutaneous TID WC Waldemar Dickens, MD   1 Units at 03/03/14 0840  . levofloxacin (LEVAQUIN) IVPB 750 mg  750 mg Intravenous Q24H Waldemar Dickens, MD      . lisinopril (PRINIVIL,ZESTRIL) tablet 5 mg  5 mg Oral Daily Waldemar Dickens, MD      . oxyCODONE (Oxy IR/ROXICODONE) immediate release tablet 5 mg  5 mg Oral Q4H PRN Waldemar Dickens, MD         Allergies as of 03/02/2014  . (No Known Allergies)    Family History  Problem Relation Age of Onset  . Diabetes Mother   . Hypertension Mother   . Cancer Father   . Stomach cancer Father   . Cancer Sister   . Hypertension Other   . Colon cancer Neg Hx     History   Social History  . Marital Status: Single    Spouse Name: N/A    Number of Children: N/A  . Years of Education: N/A   Occupational History  . Not on file.   Social History Main Topics  . Smoking status: Current Every Day Smoker -- 1.50 packs/day for 40 years    Types: Cigarettes  . Smokeless tobacco: Never Used  . Alcohol Use: 0.0 oz/week    0 Not specified per week     Comment: Drinks "every once in awhile", will drink 1/2 gallon of liqour   . Drug Use: No  . Sexual Activity: Not on file   Other Topics Concern  . Not on file   Social History Narrative    Review of Systems: Gen: see HPI CV: rare chest discomfort, "every once in awhile", usually with exertion, resolves on own Resp: see HPI GI: see HPI GU : Denies urinary burning, urinary frequency, urinary incontinence.  MS: +joint pain Derm: Denies rash, itching, dry skin Psych: Denies depression, anxiety,confusion, or memory loss   Physical Exam: Vital signs in last 24 hours: Temp:  [98.6 F (37 C)-98.8 F (37.1 C)] 98.8 F (37.1 C) (12/15 0559) Pulse Rate:  [97-114] 102 (12/15 0559) Resp:  [14-18] 16 (12/15 0559) BP: (101-156)/(60-85) 146/70 mmHg (12/15 0559) SpO2:  [97 %-100 %] 97 % (12/15 0559) Weight:  [115 lb 8.3 oz (52.4 kg)] 115 lb 8.3 oz (52.4 kg) (12/14 2300) Last BM Date: 03/02/14 General:   Alert,  Disheveled-appearing Eyes:  Sclera clear, no icterus.  Ears:  Normal auditory acuity. Nose:  No deformity, discharge,  or lesions. Mouth:  Poor dentition, difficult to understand speech Neck:  Bilateral notably enlarged cervical adenopathy Lungs:  Scattered rhonchi  Heart:  S 1 S2 present, no murmurs  appreciated Abdomen:  Soft, nontender and nondistended. +BS, large right inguinal/scrotal hernia, chronic Rectal:  Deferred  Msk:  Symmetrical without gross deformities.  Extremities:  Without edema. Neurologic:  Alert and  oriented x4 Skin:  Intact without significant lesions or rashes. Psych:  Alert and cooperative. Normal mood and affect.  Intake/Output from previous day: 12/14 0701 - 12/15 0700 In: 1023.3 [I.V.:623.3; IV Piggyback:400] Out: 200 [Urine:200] Intake/Output this shift:    Lab Results:  Recent Labs  02/28/14 1435 03/02/14 2023 03/03/14 0549  WBC 18.5* 14.0* 11.0*  HGB 10.4* 9.6* 8.2*  HCT 33.8* 30.5* 26.2*  PLT 505* 379 385   BMET  Recent Labs  02/28/14 1435 03/02/14 2023 03/03/14 0549  NA 141 133* 138  K 2.8* 3.3* 4.1  CL 98 93* 100  CO2 29 28 28   GLUCOSE 92 288* 146*  BUN 8 5* 4*  CREATININE 0.59 0.61 0.55  CALCIUM 8.8 8.7 8.1*   LFT  Recent Labs  03/03/14 0549  PROT 6.1  ALBUMIN 2.1*  AST 13  ALT 6  ALKPHOS 146*  BILITOT 0.3    Studies/Results: Dg Chest 2 View  03/02/2014   CLINICAL DATA:  Choking on food denied  EXAM: CHEST  2 VIEW  COMPARISON:  02/28/2014  FINDINGS: Right hemidiaphragm remains elevated. Nodular density now projects over the inferior right hilum. Hazy airspace disease at the right base. Left lung is clear. Chronic right-sided rib deformities.  IMPRESSION: Hazy right lower lobe airspace disease.  New nodular density at the right lung base is indeterminate. Followup studies to ensure resolution are recommended. If the abnormality fails to resolve, CT is recommended.   Electronically Signed   By: Maryclare Bean M.D.   On: 03/02/2014 20:03   CT in June 2011: Cirrhotic appearing liver. Prominent cleft of the right lobe of the liver containing calcifications along the superior margin. No definitive mass identified. Dedicated liver MR (in addition to correlation with alpha fetoprotein level) may be considered for further  delineation given the heterogeneous enhancement and surrounding adenopathy.  Right middle lobe parenchymal changes may represent scarring and pneumonitis type changes. This can be evaluated on follow-up CT chest in 3 months after treatment of any clinically suspected infectious infiltrate.  Gallstones and possible distal common bile duct stone (as versus calcification associated with the pancreatic head) but without dilation of intrahepatic or extrahepatic bile ducts.  Right inguinal hernia contains small bowel extending to the right scrotum. Presently no evidence of obstruction.  Atherosclerotic type changes aorta and branch vessels.  Slightly prominent size prostate gland.  Impression: 63 year old male admitted with episode of choking, noting 4-5 month history of solid food dysphagia. Now being treated for CAP. Notable cervical adenopathy on exam and CT neck ordered this morning. Prior history of cirrhosis as noted per review of epic, with concerns for a prominent cleft of the right lobe of liver without definitive mass identified on CT in June 2011; no further imaging of liver since this time. 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 Recommend imaging while inpatient, starting with US abdomen. Needs EGD this admission for evaluation of web, ring, stricture, unable to exclude occult malignancy. Also unable to exclude motility disorder and/or other structural components playing a role in swallowing. Will await CT neck and chest findings prior to arranging EGD.   Anemia: without overt signs of GI bleeding. Likely multifactorial. Await anemia panel.   Plan: Add PPI BID US abdomen Await CT neck and chest findings EGD this admission with dilation as appropriate. Discussed risks and benefits with patient, who stated understanding. Colonoscopy as outpatient.  Further work-up of cirrhosis etiology as outpatient, cirrhosis care as  outpatient   Orvil Feil, ANP-BC [facility]     LOS: 1 day    03/03/2014, 9:10 AM

## 2014-03-03 NOTE — Progress Notes (Signed)
UR completed 

## 2014-03-03 NOTE — Clinical Social Work Note (Signed)
CSW received consult for homeless issues. CSW met with pt who was alert and oriented. He states that he lives with his sister and has never been homeless. Pt plans to return to live with his sister, but may also consider getting an apartment on his own. He reports no concerns at this time. CSW will sign off, but can be reconsulted if needed.  [name], [place]

## 2014-03-03 NOTE — Progress Notes (Signed)
ANTIBIOTIC CONSULT NOTE - INITIAL  Pharmacy Consult for Vancomycin Indication: Positive blood culture  No Known Allergies  Patient Measurements: Height: 6' (182.9 cm) Weight: 115 lb 8.3 oz (52.4 kg) IBW/kg (Calculated) : 77.6 Adjusted Body Weight:   Vital Signs: Temp: 97.6 F (36.4 C) (12/15 1427) Temp Source: Oral (12/15 1427) BP: 117/72 mmHg (12/15 1427) Pulse Rate: 94 (12/15 1427) Intake/Output from previous day: 12/14 0701 - 12/15 0700 In: 1023.3 [I.V.:623.3; IV Piggyback:400] Out: 200 [Urine:200] Intake/Output from this shift: Total I/O In: -  Out: 100 [Urine:100]  Labs:  Recent Labs  03/02/14 2023 03/03/14 0549  WBC 14.0* 11.0*  HGB 9.6* 8.2*  PLT 379 385  CREATININE 0.61 0.55   Estimated Creatinine Clearance: 68.2 mL/min (by C-G formula based on Cr of 0.55). No results for input(s): VANCOTROUGH, VANCOPEAK, VANCORANDOM, GENTTROUGH, GENTPEAK, GENTRANDOM, TOBRATROUGH, TOBRAPEAK, TOBRARND, AMIKACINPEAK, AMIKACINTROU, AMIKACIN in the last 72 hours.   Microbiology: Recent Results (from the past 720 hour(s))  Culture, blood (routine x 2) Call MD if unable to obtain prior to antibiotics being given     Status: None (Preliminary result)   Collection Time: 03/02/14 11:23 PM  Result Value Ref Range Status   Specimen Description BLOOD RIGHT ANTECUBITAL  Final   Special Requests BOTTLES DRAWN AEROBIC AND ANAEROBIC [place]  Final   Culture NO GROWTH 1 DAY  Final   Report Status PENDING  Incomplete  Culture, blood (routine x 2) Call MD if unable to obtain prior to antibiotics being given     Status: None (Preliminary result)   Collection Time: 03/02/14 11:23 PM  Result Value Ref Range Status   Specimen Description BLOOD RIGHT ANTECUBITAL  Final   Special Requests   Final    BOTTLES DRAWN AEROBIC AND ANAEROBIC AEB=6CC ANA=4CC   Culture   Final    GRAM POSITIVE COCCI IN CLUSTERS Gram Stain Report Called to,Read Back By and Verified With: THOMAS K AT 2112 ON [id number] BY  FORSYTH K    Report Status PENDING  Incomplete  MRSA PCR Screening     Status: None   Collection Time: 03/03/14 12:10 AM  Result Value Ref Range Status   MRSA by PCR NEGATIVE NEGATIVE Final    Comment:        The GeneXpert MRSA Assay (FDA approved for NASAL specimens only), is one component of a comprehensive MRSA colonization surveillance program. It is not intended to diagnose MRSA infection nor to guide or monitor treatment for MRSA infections.     Medical History: Past Medical History  Diagnosis Date  . Diabetes mellitus   . Liver disease   . Enlarged prostate   . Hernia   . Alcohol dependence   . Seizures   . Hypercholesterolemia     Medications:  Scheduled:  . feeding supplement (GLUCERNA SHAKE)  237 mL Oral TID BM  . insulin aspart  0-9 Units Subcutaneous TID WC  . insulin aspart  4 Units Subcutaneous Once  . [START ON 03/04/2014] levalbuterol  0.63 mg Nebulization TID  . levofloxacin (LEVAQUIN) IV  750 mg Intravenous Q24H  . lisinopril  5 mg Oral Daily  . [START ON 03/04/2014] pantoprazole  40 mg Oral QAC breakfast  . vancomycin  1,000 mg Intravenous Once  . [START ON 03/04/2014] vancomycin  750 mg Intravenous Q12H   Assessment: Positive blood culture results.Aerobic gram positive cocci in clusters. Vancomycin per pharmacy protocol Labs reviewed  Goal of Therapy:  Vancomycin trough level 15-20 mcg/ml  Plan:  Vancomycin  1 GM IV tonight, then 750 mg IV every 12 hours Vancomycin trough at steady state Monitor renal function Labs per protocol  [name], [name] 03/03/2014,10:17 PM

## 2014-03-03 NOTE — Progress Notes (Signed)
[facility] Radiology called to report results from patient's CT of the chest. Notified [name], NP that extensive results were posted for patient.

## 2014-03-03 NOTE — Progress Notes (Signed)
INITIAL NUTRITION ASSESSMENT  DOCUMENTATION CODES Per approved criteria  -Severe malnutrition in the context of chronic illness -Underweight  Pt meets criteria for severe  MALNUTRITION in the context of chronic illness as evidenced by severe muscle wasting clavicles, patella, quadricep regions, and 15% weight loss within 7 months  INTERVENTION: Ensure Complete po BID, each supplement provides 350 kcal and 13 grams of protein   Follow for nutrition care  NUTRITION DIAGNOSIS: Inadequate oral intake related to dysphagia? as evidenced by severe wasting clavicles, patella, quadricept regions, 15% wt loss x 7 months  Goal: Pt to meet >/= 90% of their estimated nutrition needs    Monitor:  Diet advancement, protein-energy intake, labs and wt trends   Reason for Assessment: Malnutrition Screen   63 y.o. male  Admitting Dx: CAP (community acquired pneumonia)  ASSESSMENT: Pt lives with his sister. He has swallow difficulty and limited solid food intake.  hx of alcohol abuse, liver disease and diabetes. [place] of abdomen and EGD with dilation planned.  Pt is asking for food. He reports commonly consumes hamburgers, hot dogs, fried chicken and drinks primarily soft drinks for beverages. Question amount of actual intake? Will be following his meal intake once diet is advanced.  Nutrition Focused Physical Exam:  Subcutaneous Fat:  Orbital Region: moderate depleiton Upper Arm Region: severe depletion Thoracic and Lumbar Region: severe depletion  Muscle:  Temple Region: moderate wasting Clavicle Bone Region: severe wasting Clavicle and Acromion Bone Region: severe wasting Scapular Bone Region: moderate wasting Dorsal Hand: moderate wasting Patellar Region: severe wasting Anterior Thigh Region: severe wasting Posterior Calf Region: moderate wasting  Edema: none    Height: Ht Readings from Last 1 Encounters:  03/02/14 6' (1.829 m)    Weight: Wt Readings from Last 1 Encounters:   03/02/14 115 lb 8.3 oz (52.4 kg)    Ideal Body Weight: 178# (80.9 kg)  % Ideal Body Weight: 80%  Wt Readings from Last 10 Encounters:  03/02/14 115 lb 8.3 oz (52.4 kg)  02/28/14 122 lb (55.339 kg)  02/15/14 122 lb (55.339 kg)  07/20/13 136 lb 4 oz (61.803 kg)  05/07/11 120 lb (54.432 kg)  12/19/10 114 lb 13.8 oz (52.1 kg)    Usual Body Weight: 135#  % Usual Body Weight: 85%   BMI:  Body mass index is 15.66 kg/(m^2). underweight  Estimated Nutritional Needs: Kcal: 8453-6468 ( to promote gradual wt gain) Protein: 75-85 gr Fluid: >1500 ml daily  Skin: intact  Diet Order: Diet NPO time specified Except for: Sips with Meds  EDUCATION NEEDS: -No education needs identified at this time   Intake/Output Summary (Last 24 hours) at 03/03/14 1217 Last data filed at 03/03/14 1101  Gross per 24 hour  Intake 1023.33 ml  Output    200 ml  Net 823.33 ml    Last BM: 12/14  Labs:   Recent Labs Lab 02/28/14 1435 03/02/14 2023 03/03/14 0549 03/03/14 0841  NA 141 133* 138  --   K 2.8* 3.3* 4.1  --   CL 98 93* 100  --   CO2 29 28 28   --   BUN 8 5* 4*  --   CREATININE 0.59 0.61 0.55  --   CALCIUM 8.8 8.7 8.1*  --   MG  --   --   --  1.3*  GLUCOSE 92 288* 146*  --     CBG (last 3)   Recent Labs  03/02/14 1808 03/02/14 2324 03/03/14 0711  GLUCAP 325* 288* 128*  Scheduled Meds: . atorvastatin  10 mg Oral QPM  . heparin  5,000 Units Subcutaneous 3 times per day  . insulin aspart  0-9 Units Subcutaneous TID WC  . insulin detemir  25 Units Subcutaneous Daily  . levofloxacin (LEVAQUIN) IV  750 mg Intravenous Q24H  . lisinopril  5 mg Oral Daily  . magnesium sulfate 1 - 4 g bolus IVPB  2 g Intravenous Once  . pantoprazole (PROTONIX) IV  40 mg Intravenous Q12H    Continuous Infusions: . 0.9 % NaCl with KCl 20 mEq / L 75 mL/hr at 03/03/14 0840    Past Medical History  Diagnosis Date  . Diabetes mellitus   . Liver disease   . Enlarged prostate   . Hernia    . Alcohol dependence   . Seizures   . Hypercholesterolemia     Past Surgical History  Procedure Laterality Date  . Appendectomy      Colman Cater MS,RD,CSG,LDN Office: [phone] Pager: [phone]

## 2014-03-04 ENCOUNTER — Inpatient Hospital Stay (HOSPITAL_COMMUNITY): Payer: Medicare Other

## 2014-03-04 DIAGNOSIS — E43 Unspecified severe protein-calorie malnutrition: Secondary | ICD-10-CM | POA: Diagnosis present

## 2014-03-04 DIAGNOSIS — R7881 Bacteremia: Secondary | ICD-10-CM | POA: Insufficient documentation

## 2014-03-04 DIAGNOSIS — D638 Anemia in other chronic diseases classified elsewhere: Secondary | ICD-10-CM

## 2014-03-04 DIAGNOSIS — Z720 Tobacco use: Secondary | ICD-10-CM

## 2014-03-04 LAB — CBC
HEMATOCRIT: 24.9 % — AB (ref 39.0–52.0)
HEMOGLOBIN: 7.9 g/dL — AB (ref 13.0–17.0)
MCH: 21.9 pg — ABNORMAL LOW (ref 26.0–34.0)
MCHC: 31.7 g/dL (ref 30.0–36.0)
MCV: 69.2 fL — ABNORMAL LOW (ref 78.0–100.0)
Platelets: 355 10*3/uL (ref 150–400)
RBC: 3.6 MIL/uL — AB (ref 4.22–5.81)
RDW: 16.2 % — ABNORMAL HIGH (ref 11.5–15.5)
WBC: 10.4 10*3/uL (ref 4.0–10.5)

## 2014-03-04 LAB — LEGIONELLA ANTIGEN, URINE

## 2014-03-04 LAB — BASIC METABOLIC PANEL
Anion gap: 9 (ref 5–15)
BUN: 3 mg/dL — AB (ref 6–23)
CHLORIDE: 97 meq/L (ref 96–112)
CO2: 29 meq/L (ref 19–32)
Calcium: 8.2 mg/dL — ABNORMAL LOW (ref 8.4–10.5)
Creatinine, Ser: 0.67 mg/dL (ref 0.50–1.35)
GFR calc Af Amer: >90 mL/min (ref >90)
GLUCOSE: 172 mg/dL — AB (ref 70–99)
Potassium: 4.2 mEq/L (ref 3.7–5.3)
Sodium: 135 mEq/L — ABNORMAL LOW (ref 137–147)

## 2014-03-04 LAB — GLUCOSE, CAPILLARY
GLUCOSE-CAPILLARY: 156 mg/dL — AB (ref 70–99)
Glucose-Capillary: 209 mg/dL — ABNORMAL HIGH (ref 70–99)
Glucose-Capillary: 347 mg/dL — ABNORMAL HIGH (ref 70–99)
Glucose-Capillary: 119 mg/dL — ABNORMAL HIGH (ref 70–99)

## 2014-03-04 LAB — ABO/RH: ABO/RH(D): B POS

## 2014-03-04 LAB — AFP TUMOR MARKER: AFP-Tumor Marker: 2.7 ng/mL (ref <6.1)

## 2014-03-04 LAB — HEMOGLOBIN A1C
HEMOGLOBIN A1C: 8 % — AB (ref <5.7)
Mean Plasma Glucose: 183 mg/dL — ABNORMAL HIGH (ref <117)

## 2014-03-04 LAB — CANCER ANTIGEN 19-9: CA 19-9: 95.8 U/mL — ABNORMAL HIGH (ref <35.0)

## 2014-03-04 LAB — PREPARE RBC (CROSSMATCH)

## 2014-03-04 MED ORDER — PROMETHAZINE HCL 25 MG/ML IJ SOLN: 12.5000 mg | INTRAMUSCULAR | Status: AC

## 2014-03-04 MED ORDER — DIPHENHYDRAMINE HCL 25 MG PO CAPS
25.0000 mg | ORAL_CAPSULE | Freq: Once | ORAL | Status: AC
  Administered 2014-03-05: 25 mg via ORAL
  Filled 2014-03-04: qty 1

## 2014-03-04 MED ORDER — INSULIN ASPART 100 UNIT/ML ~~LOC~~ SOLN
3.0000 [IU] | Freq: Three times a day (TID) | SUBCUTANEOUS | Status: DC
  Administered 2014-03-04 – 2014-03-06 (×3): 3 [IU] via SUBCUTANEOUS

## 2014-03-04 MED ORDER — LIDOCAINE HCL (PF) 2 % IJ SOLN
INTRAMUSCULAR | Status: AC
  Administered 2014-03-04: 10 mL
  Filled 2014-03-04: qty 10

## 2014-03-04 MED ORDER — LIDOCAINE HCL (PF) 2 % IJ SOLN
10.0000 mL | Freq: Once | INTRAMUSCULAR | Status: AC
  Administered 2014-03-04: 10 mL
  Filled 2014-03-04: qty 10

## 2014-03-04 MED ORDER — HEPARIN SOD (PORK) LOCK FLUSH 100 UNIT/ML IV SOLN: 250.0000 [IU] | INTRAVENOUS | Status: DC | PRN

## 2014-03-04 MED ORDER — INSULIN ASPART 100 UNIT/ML ~~LOC~~ SOLN: 0.0000 [IU] | Freq: Three times a day (TID) | SUBCUTANEOUS | Status: DC

## 2014-03-04 MED ORDER — SODIUM CHLORIDE 0.9 % IJ SOLN: 10.0000 mL | INTRAMUSCULAR | Status: DC | PRN

## 2014-03-04 MED ORDER — SODIUM CHLORIDE 0.9 % IJ SOLN
3.0000 mL | INTRAMUSCULAR | Status: AC | PRN
  Administered 2014-03-05: 3 mL

## 2014-03-04 MED ORDER — PANTOPRAZOLE SODIUM 40 MG PO TBEC
40.0000 mg | DELAYED_RELEASE_TABLET | Freq: Two times a day (BID) | ORAL | Status: DC
  Administered 2014-03-04 – 2014-03-10 (×11): 40 mg via ORAL
  Filled 2014-03-04 (×11): qty 1

## 2014-03-04 MED ORDER — SODIUM CHLORIDE 0.9 % IV SOLN: 250.0000 mL | Freq: Once | INTRAVENOUS | Status: AC

## 2014-03-04 MED ORDER — ACETAMINOPHEN 325 MG PO TABS
650.0000 mg | ORAL_TABLET | Freq: Once | ORAL | Status: AC
  Administered 2014-03-05: 650 mg via ORAL
  Filled 2014-03-04: qty 2

## 2014-03-04 NOTE — Progress Notes (Signed)
[facility] PROGRESS NOTE  [name] LKG:[id number] DOB: October 13, 1950 DOA: 03/02/2014 PCP: [name], MD  Assessment/Plan: CAP: he remains afebrile and non-toxic appearing. Blood culture + cocci in clusters. May be contaminate as other sample no growth. Continue Vancomycin and levaquin.  Await legionella urine antigen, Strep pneumo urine antigen negative.    Dysphagia/neck mass: CT neck with "malignancy" with widespread cystic and necrotic X-nodal appearing tumor. Evaluated by GI who opine no evidence for pancreatitis and metastatic disease of unknown primary. Dr Oneida Alar arranged for biopsy of neck nodules by radiology. He may also need EGD this admission to assess esophageal thickening. GI note indicates nodes not likely cause for dysphagia. Full liquid diet  Liver mass/lesion: in setting of cirrhosis likely related to ETOH abuse. see CT and abdominal US results. GI assistance greatly appreciated. Have requested oncology consult as well. See #2.  Pulmonary nodule: multiple per CT . Hx smoking. Have requested oncology consult  Bacteremia: +Blood culture. See #1.    Anemia: microcytic. Hg trending down. Chart review indicates hx of same and on iron supplements at home but current Hg much lower than in past. Await FOBT. Appreciate GI assistance.   Hypokalemia: resolved. monitor   HTN: controlled. Continue LIsinopril  DM: pt reports taking his sugar pill twice a day but no insulin. Last A1c 2012 18.1. CBG range 128-288. Continue to hold glipizide and lantus. SSI for optimal control.  A1c 8.0  Homelessness: spoke with sister who confirms that he resides with her. He is not homeless.   HLD:  continue statin  Hyponatremia: resolved. BMET in am  Severe malnutrition likely multifactorial i.e. Long hx ETOH abuse and metastatic disease primary unknown. Appreciate dietician assistance. Nutritional supplement   Code Status: full Family Communication: sister by phone Disposition  Plan: home when ready   Consultants:  Dr fields gastroenterology  Oncology  Procedures:  Biopsy 12/16 on neck mass  Antibiotics:  levaquin 03/03/14>>  Vancomycin 03/03/14>>  HPI/Subjective: Ambulating on hall with steady gait. Denies pain/discomfort. Complains of being hungy  Objective: Filed Vitals:   03/04/14 0602  BP: 116/79  Pulse: 98  Temp: 98.4 F (36.9 C)  Resp: 20    Intake/Output Summary (Last 24 hours) at 03/04/14 1046 Last data filed at 03/04/14 1030  Gross per 24 hour  Intake 2686.25 ml  Output   1350 ml  Net 1336.25 ml   Filed Weights   03/02/14 2300  Weight: 52.4 kg (115 lb 8.3 oz)    Exam:   General:  Thin frail appears chronically ill, unkept  Cardiovascular: RRR No m/g/r no LE edema  Respiratory: normal effort BS with diffuse coarseness. Rhonchi throughout no wheeze  Abdomen: flat soft +BS non-tender to palpation  Musculoskeletal: no clubbing or cyanosis  Neck: mild bulge on right side of neck. +cervical lymphadenopathy. Non-tender to palpation no erythema no edema   Data Reviewed: Basic Metabolic Panel:  Recent Labs Lab 02/28/14 1435 03/02/14 2023 03/03/14 0549 03/03/14 0841 03/04/14 0609  NA 141 133* 138  --  135*  K 2.8* 3.3* 4.1  --  4.2  CL 98 93* 100  --  97  CO2 29 28 28   --  29  GLUCOSE 92 288* 146*  --  172*  BUN 8 5* 4*  --  3*  CREATININE 0.59 0.61 0.55  --  0.67  CALCIUM 8.8 8.7 8.1*  --  8.2*  MG  --   --   --  1.3*  --  Liver Function Tests:  Recent Labs Lab 03/03/14 0549  AST 13  ALT 6  ALKPHOS 146*  BILITOT 0.3  PROT 6.1  ALBUMIN 2.1*   No results for input(s): LIPASE, AMYLASE in the last 168 hours. No results for input(s): AMMONIA in the last 168 hours. CBC:  Recent Labs Lab 02/28/14 1435 03/02/14 2023 03/03/14 0549 03/04/14 0609  WBC 18.5* 14.0* 11.0* 10.4  NEUTROABS 16.1* 11.4*  --   --   HGB 10.4* 9.6* 8.2* 7.9*  HCT 33.8* 30.5* 26.2* 24.9*  MCV 69.5* 68.2* 68.2* 69.2*  PLT  505* 379 385 355   Cardiac Enzymes: No results for input(s): CKTOTAL, CKMB, CKMBINDEX, TROPONINI in the last 168 hours. BNP (last 3 results) No results for input(s): PROBNP in the last 8760 hours. CBG:  Recent Labs Lab 03/03/14 1135 03/03/14 1223 03/03/14 1632 03/03/14 2144 03/04/14 0816  GLUCAP 59* 130* 181* 304* 156*    Recent Results (from the past 240 hour(s))  Culture, blood (routine x 2) Call MD if unable to obtain prior to antibiotics being given     Status: None (Preliminary result)   Collection Time: 03/02/14 11:23 PM  Result Value Ref Range Status   Specimen Description BLOOD RIGHT ANTECUBITAL  Final   Special Requests BOTTLES DRAWN AEROBIC AND ANAEROBIC [place]  Final   Culture NO GROWTH 2 DAYS  Final   Report Status PENDING  Incomplete  Culture, blood (routine x 2) Call MD if unable to obtain prior to antibiotics being given     Status: None (Preliminary result)   Collection Time: 03/02/14 11:23 PM  Result Value Ref Range Status   Specimen Description BLOOD RIGHT ANTECUBITAL  Final   Special Requests   Final    BOTTLES DRAWN AEROBIC AND ANAEROBIC AEB=6CC ANA=4CC   Culture   Final    GRAM POSITIVE COCCI IN CLUSTERS Gram Stain Report Called to,Read Back By and Verified With: THOMAS K AT 2112 ON [id number] BY FORSYTH K Performed at [facility] [address]    Report Status PENDING  Incomplete  MRSA PCR Screening     Status: None   Collection Time: 03/03/14 12:10 AM  Result Value Ref Range Status   MRSA by PCR NEGATIVE NEGATIVE Final    Comment:        The GeneXpert MRSA Assay (FDA approved for NASAL specimens only), is one component of a comprehensive MRSA colonization surveillance program. It is not intended to diagnose MRSA infection nor to guide or monitor treatment for MRSA infections.      Studies: Dg Chest 2 View  03/02/2014   CLINICAL DATA:  Choking on food denied  EXAM: CHEST  2 VIEW  COMPARISON:  02/28/2014  FINDINGS: Right hemidiaphragm remains  elevated. Nodular density now projects over the inferior right hilum. Hazy airspace disease at the right base. Left lung is clear. Chronic right-sided rib deformities.  IMPRESSION: Hazy right lower lobe airspace disease.  New nodular density at the right lung base is indeterminate. Followup studies to ensure resolution are recommended. If the abnormality fails to resolve, CT is recommended.   Electronically Signed   By: Maryclare Bean M.D.   On: 03/02/2014 20:03   Ct Soft Tissue Neck W Contrast  03/03/2014   CLINICAL DATA:  63 year old male with cough, shortness of breath, abnormal neck fullness. Dysphagia for 4-5 months.  EXAM: CT NECK WITH CONTRAST  TECHNIQUE: Multidetector CT imaging of the neck was performed using the standard protocol following the bolus administration  of intravenous contrast.  CONTRAST:  71mL OMNIPAQUE IOHEXOL 300 MG/ML SOLN in conjunction with contrast enhanced imaging of the chest reported separately.  COMPARISON:  Noncontrast CTs of the head 02/15/2014 and earlier.  FINDINGS: Chest findings today reported separately.  Study is intermittently degraded by motion artifact despite repeated imaging attempts.  Bulky and confluent cystic/necrotic soft tissue mass is in the right neck. A conglomeration at the right level 2 nodal station measures up to 45 mm largest dimension. More solid enhancing nodal/ex nodal disease tracking to the right level 4 station. At the level of the lateral wall of the oropharynx there is asymmetric but indistinct soft tissue thickening of the pharynx up to 9 mm (series 8, image 43). Some of the abnormal cystic/necrotic soft tissue mass extends to abut the enter surface of the ramus of the right mandible (series 8, image 44). And there is motion artifact in the region of the mandible. There are prior ORIF changes to the right mandible ramus. Dentition is absent throughout. There is sclerosis of the posterior body of the right mandible, but no bony destruction or erosion  identified.  There are smaller cystic/necrotic masses in the left head and neck including at the left level 1B nodal station (series 8, image 54). There is conglomerate involvement at the left Level 3 station. The left level 2 station is spared. There is a mildly enlarged solid-appearing node at the level 1 A station, 10 mm short axis. The largest of these masses measure up to 3.9 cm largest dimension.  Negative thyroid. Larynx is within normal limits. Negative retropharyngeal space. Negative sublingual space. Negative submandibular and parotid glands except for regional mass effect and right submandibular ductal ectasia. Visualized orbit soft tissues are within normal limits. Grossly negative visible brain parenchyma.  Chronic inflammation of the left maxillary sinus, left middle ear and mastoid, with mucoperiosteal thickening in those regions. There may be a small accessory tooth in the anterior left maxillary sinus (series 17, image 28).  Both carotid and vertebral arteries in the neck remain patent. The right IJ is in dated and thrombosed. The bulky right neck disease is inseparable from the right carotid bifurcation, and tracks medially through the splayed right external and internal carotid arteries. The left IJ is compressed but remains patent.  No acute or suspicious osseous lesion identified. Degenerative changes in the cervical spine.  IMPRESSION: 1. 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 This invades and occludes the right internal jugular vein, and encases the right carotid bifurcation. 2. Left level 1 and level 3 nodal station involvement as well. 3. Primary tumor site is uncertain, but might be the right tonsillar pillar. 4. Chest CT findings today reported separately.   Electronically Signed   By: Lars Pinks M.D.   On: 03/03/2014 12:55   Ct Chest W Contrast  03/03/2014   CLINICAL DATA:   Cough, shortness of breath. Neck fullness. Dysphagia.  EXAM: CT CHEST WITH CONTRAST  TECHNIQUE: Multidetector CT imaging of the chest was performed during intravenous contrast administration.  CONTRAST:  35mL OMNIPAQUE IOHEXOL 300 MG/ML  SOLN  COMPARISON:  Chest CT 01/05/2010  FINDINGS: Neck findings reported separately.  Visualized thyroid is unremarkable. No axillary or mediastinal lymphadenopathy. Bilateral lower neck adenopathy, see dedicated neck CT report for description. Normal heart size. No pericardial effusion. Aorta and main pulmonary artery are normal in caliber.  Central airways are patent. There  is a 1.9 x 1.7 cm nodule within the right lower lobe (image 34; series 3). There is an adjacent 0.6 cm right lower lobe nodule (image 34; series 3). There is a 0.5 cm right upper lobe nodule (image 21; series 3). There is a 3 mm right upper lobe nodule (image 15; series 3). Dependent ground-glass and consolidative opacity demonstrated within the right and left lower lobes. Additionally there is left lower lobe bronchial wall thickening. No pleural effusion or pneumothorax.  Visualization of the upper abdomen demonstrates a centrally necrotic enhancing mass within the hepatic dome measuring 2.8 x 2.9 cm. There is an additional 0.7 cm low-attenuation lesion within the hepatic dome (image 44; series 2). There is an additional 1.9 x 1.9 cm peripherally enhancing centrally necrotic lesion within the hepatic dome (image 40; series 2). Re- demonstrated hepatic parenchymal calcifications.  Mild heterogeneity along the inferior margin of right hepatic lobe. This is incompletely evaluated and focal lesion at this location is not excluded. Small amount of perihepatic fluid. Small hiatal hernia. Diffuse esophageal wall thickening, most pronounced near the junction.  There is dilatation of the main pancreatic duct measuring up to 11 mm.  Multiple old posterior rib fractures. No aggressive or acute appearing osseous lesions.  Degenerative changes of the lower thoracic and lumbar vertebral bodies. Gynecomastia.  IMPRESSION: 1. Multiple pulmonary nodules, concerning for metastatic disease. 2. Multiple centrally necrotic peripherally enhancing hepatic masses, concerning for metastatic disease. 3. Marked dilatation of the main pancreatic duct measuring up to 11 mm. The causative etiology is not identified on current evaluation as the entire pancreas is not included. Recommend dedicated evaluation with contrast-enhanced CT or potentially MRI. 4. Bilateral lower cervical adenopathy, most compatible with metastatic disease. See dedicated neck CT report. 5. Bilateral lower lobe ground-glass opacities, favored to represent atelectasis. Additionally there is left lower lobe bronchial wall thickening, raising the possibility of associated aspiration. 6. Diffuse wall thickening of the esophagus, most pronounced near the GE junction. While findings may be secondary to esophagitis, underlying distal esophageal mass is not entirely excluded. Consider correlation with EGD.  These results will be called to the ordering clinician or representative by the Radiologist Assistant, and communication documented in the PACS or zVision Dashboard.   Electronically Signed   By: Lovey Newcomer M.D.   On: 03/03/2014 13:20   US Abdomen Limited Ruq  03/03/2014   CLINICAL DATA:  Cirrhosis, diabetes, hypercholesterolemia, abnormal CT exam in 2011 showing a questionable hepatic abnormality  EXAM: US ABDOMEN LIMITED - RIGHT UPPER QUADRANT  COMPARISON:  CT chest 01/05/2010, CT abdomen 08/19/2009  FINDINGS: Gallbladder:  Thickened gallbladder wall. Shadowing calculi and sludge within gallbladder. Gallbladder incompletely distended. No sonographic Murphy sign. Mild amount of pericholecystic fluid.  Common bile duct:  Diameter: Normal caliber 4 mm diameter  Liver:  Increased hepatic echogenicity with nodular margins consistent with cirrhosis. Hepatopetal portal venous flow.  Question nodule within RIGHT lobe 3.0 x 1.9 x 2.6 cm. Linear calcification within RIGHT lobe on prior CT exam again identified.  Mild RIGHT renal collecting system dilatation noted.  Mildly heterogeneous appearance of the pancreas is identified, atrophic, poorly assessed due to overlying bowel.  IMPRESSION: Thickened gallbladder wall nonspecific in the setting of minimal ascites.  Gallstones and sludge within gallbladder without definite sonographic Murphy sign.  If there is persistent clinical concern for acute cholecystitis recommend hepatobiliary imaging.  Cirrhotic appearing liver with linear calcification in RIGHT lobe again identified.  Nonspecific RIGHT lobe liver lesion 3.0 x 1.9 x  2.6 cm, uncertain etiology, not definitely seen in prior exam; recommend followup MR assessment with and without contrast to characterize.   Electronically Signed   By: Lavonia Dana M.D.   On: 03/03/2014 14:35    Scheduled Meds: . feeding supplement (GLUCERNA SHAKE)  237 mL Oral TID BM  . insulin aspart  0-9 Units Subcutaneous TID WC  . insulin aspart  3 Units Subcutaneous TID WC  . levalbuterol  0.63 mg Nebulization TID  . levofloxacin (LEVAQUIN) IV  750 mg Intravenous Q24H  . lisinopril  5 mg Oral Daily  . pantoprazole  40 mg Oral BID AC  . vancomycin  750 mg Intravenous Q12H   Continuous Infusions: . 0.9 % NaCl with KCl 20 mEq / L 75 mL/hr at 03/04/14 5732    Principal Problem:   CAP (community acquired pneumonia) Active Problems:   Seizure disorder   HTN (hypertension)   Diabetes type 2, uncontrolled   Hyperlipidemia   Dysphagia   Hypokalemia   Hypoglycemia associated with diabetes   Cervical lymphadenopathy   Tobacco abuse   Anemia   Cirrhosis   Hypomagnesemia   Neck mass   Liver mass   Metastatic disease   Severe malnutrition    Time spent: 35 minutes    [facility] Pager [phone]. If 7PM-7AM, please contact night-coverage at [email or web address], password  [facility] 03/04/2014, 10:46 AM  LOS: 2 days

## 2014-03-04 NOTE — Procedures (Signed)
PreOperative Dx: Cervical adenopathy Postoperative Dx: Cervical adenopathy Procedure:   US guided core biopsy of an enlarged RIGHT cervical lymph node Radiologist:  Thornton Papas Anesthesia:  3 ml of 2% lidocaine Specimen:  3 core biopsy specimens, 14 gauge EBL:   < 1 ml Complications: None Tolerated very well by patient

## 2014-03-04 NOTE — Progress Notes (Addendum)
Subjective: Denies abdominal pain, N/V. NPO since admission.   Objective: Vital signs in last 24 hours: Temp:  [97.6 F (36.4 C)-98.8 F (37.1 C)] 98.4 F (36.9 C) (12/16 0602) Pulse Rate:  [94-108] 98 (12/16 0602) Resp:  [16-20] 20 (12/16 0602) BP: (116-151)/(72-79) 116/79 mmHg (12/16 0602) SpO2:  [94 %-99 %] 94 % (12/16 0755) Last BM Date: 03/02/14 General:   Alert and oriented, pleasant Mouth:  edentulous Neck:  Significant cervical adenopathy appreciated Heart:  S1, S2 present, no murmurs noted.  Abdomen:  Bowel sounds present, soft, non-tender, non-distended. Large right inguinal/scortal hernia Extremities:  Without edema. Neurologic:  Alert and  oriented x4;     Intake/Output from previous day: 12/15 0701 - 12/16 0700 In: 2536.3 [P.O.:840; I.V.:1546.3; IV Piggyback:150] Out: 1050 [Urine:1050] Intake/Output this shift: Total I/O In: -  Out: 300 [Urine:300]  Lab Results:  Recent Labs  03/02/14 2023 03/03/14 0549 03/04/14 0609  WBC 14.0* 11.0* 10.4  HGB 9.6* 8.2* 7.9*  HCT 30.5* 26.2* 24.9*  PLT 379 385 355   BMET  Recent Labs  03/02/14 2023 03/03/14 0549 03/04/14 0609  NA 133* 138 135*  K 3.3* 4.1 4.2  CL 93* 100 97  CO2 28 28 29   GLUCOSE 288* 146* 172*  BUN 5* 4* 3*  CREATININE 0.61 0.55 0.67  CALCIUM 8.7 8.1* 8.2*   LFT  Recent Labs  03/03/14 0549  PROT 6.1  ALBUMIN 2.1*  AST 13  ALT 6  ALKPHOS 146*  BILITOT 0.3   CA 19-9 95.8 AFP normal  Lab Results  Component Value Date   IRON 14* 03/03/2014   TIBC 192* 03/03/2014   FERRITIN 36 03/03/2014    Studies/Results: Dg Chest 2 View  03/02/2014   CLINICAL DATA:  Choking on food denied  EXAM: CHEST  2 VIEW  COMPARISON:  02/28/2014  FINDINGS: Right hemidiaphragm remains elevated. Nodular density now projects over the inferior right hilum. Hazy airspace disease at the right base. Left lung is clear. Chronic right-sided rib deformities.  IMPRESSION: Hazy right lower lobe airspace  disease.  New nodular density at the right lung base is indeterminate. Followup studies to ensure resolution are recommended. If the abnormality fails to resolve, CT is recommended.   Electronically Signed   By: Maryclare Bean M.D.   On: 03/02/2014 20:03   Ct Soft Tissue Neck W Contrast  03/03/2014   CLINICAL DATA:  63 year old male with cough, shortness of breath, abnormal neck fullness. Dysphagia for 4-5 months.  EXAM: CT NECK WITH CONTRAST  TECHNIQUE: Multidetector CT imaging of the neck was performed using the standard protocol following the bolus administration of intravenous contrast.  CONTRAST:  9mL OMNIPAQUE IOHEXOL 300 MG/ML SOLN in conjunction with contrast enhanced imaging of the chest reported separately.  COMPARISON:  Noncontrast CTs of the head 02/15/2014 and earlier.  FINDINGS: Chest findings today reported separately.  Study is intermittently degraded by motion artifact despite repeated imaging attempts.  Bulky and confluent cystic/necrotic soft tissue mass is in the right neck. A conglomeration at the right level 2 nodal station measures up to 45 mm largest dimension. More solid enhancing nodal/ex nodal disease tracking to the right level 4 station. At the level of the lateral wall of the oropharynx there is asymmetric but indistinct soft tissue thickening of the pharynx up to 9 mm (series 8, image 43). Some of the abnormal cystic/necrotic soft tissue mass extends to abut the enter surface of the ramus of the right mandible (  series 8, image 44). And there is motion artifact in the region of the mandible. There are prior ORIF changes to the right mandible ramus. Dentition is absent throughout. There is sclerosis of the posterior body of the right mandible, but no bony destruction or erosion identified.  There are smaller cystic/necrotic masses in the left head and neck including at the left level 1B nodal station (series 8, image 54). There is conglomerate involvement at the left Level 3 station. The  left level 2 station is spared. There is a mildly enlarged solid-appearing node at the level 1 A station, 10 mm short axis. The largest of these masses measure up to 3.9 cm largest dimension.  Negative thyroid. Larynx is within normal limits. Negative retropharyngeal space. Negative sublingual space. Negative submandibular and parotid glands except for regional mass effect and right submandibular ductal ectasia. Visualized orbit soft tissues are within normal limits. Grossly negative visible brain parenchyma.  Chronic inflammation of the left maxillary sinus, left middle ear and mastoid, with mucoperiosteal thickening in those regions. There may be a small accessory tooth in the anterior left maxillary sinus (series 17, image 28).  Both carotid and vertebral arteries in the neck remain patent. The right IJ is in dated and thrombosed. The bulky right neck disease is inseparable from the right carotid bifurcation, and tracks medially through the splayed right external and internal carotid arteries. The left IJ is compressed but remains patent.  No acute or suspicious osseous lesion identified. Degenerative changes in the cervical spine.  IMPRESSION: 1. 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 This invades and occludes the right internal jugular vein, and encases the right carotid bifurcation. 2. Left level 1 and level 3 nodal station involvement as well. 3. Primary tumor site is uncertain, but might be the right tonsillar pillar. 4. Chest CT findings today reported separately.   Electronically Signed   By: Lars Pinks M.D.   On: 03/03/2014 12:55   Ct Chest W Contrast  03/03/2014   CLINICAL DATA:  Cough, shortness of breath. Neck fullness. Dysphagia.  EXAM: CT CHEST WITH CONTRAST  TECHNIQUE: Multidetector CT imaging of the chest was performed during intravenous contrast administration.  CONTRAST:  56mL  OMNIPAQUE IOHEXOL 300 MG/ML  SOLN  COMPARISON:  Chest CT 01/05/2010  FINDINGS: Neck findings reported separately.  Visualized thyroid is unremarkable. No axillary or mediastinal lymphadenopathy. Bilateral lower neck adenopathy, see dedicated neck CT report for description. Normal heart size. No pericardial effusion. Aorta and main pulmonary artery are normal in caliber.  Central airways are patent. There is a 1.9 x 1.7 cm nodule within the right lower lobe (image 34; series 3). There is an adjacent 0.6 cm right lower lobe nodule (image 34; series 3). There is a 0.5 cm right upper lobe nodule (image 21; series 3). There is a 3 mm right upper lobe nodule (image 15; series 3). Dependent ground-glass and consolidative opacity demonstrated within the right and left lower lobes. Additionally there is left lower lobe bronchial wall thickening. No pleural effusion or pneumothorax.  Visualization of the upper abdomen demonstrates a centrally necrotic enhancing mass within the hepatic dome measuring 2.8 x 2.9 cm. There is an additional 0.7 cm low-attenuation lesion within the hepatic dome (image 44; series 2). There is an additional 1.9 x 1.9 cm peripherally enhancing centrally necrotic lesion within the hepatic dome (image 40; series 2). Re- demonstrated hepatic parenchymal  calcifications.  Mild heterogeneity along the inferior margin of right hepatic lobe. This is incompletely evaluated and focal lesion at this location is not excluded. Small amount of perihepatic fluid. Small hiatal hernia. Diffuse esophageal wall thickening, most pronounced near the junction.  There is dilatation of the main pancreatic duct measuring up to 11 mm.  Multiple old posterior rib fractures. No aggressive or acute appearing osseous lesions. Degenerative changes of the lower thoracic and lumbar vertebral bodies. Gynecomastia.  IMPRESSION: 1. Multiple pulmonary nodules, concerning for metastatic disease. 2. Multiple centrally necrotic peripherally  enhancing hepatic masses, concerning for metastatic disease. 3. Marked dilatation of the main pancreatic duct measuring up to 11 mm. The causative etiology is not identified on current evaluation as the entire pancreas is not included. Recommend dedicated evaluation with contrast-enhanced CT or potentially MRI. 4. Bilateral lower cervical adenopathy, most compatible with metastatic disease. See dedicated neck CT report. 5. Bilateral lower lobe ground-glass opacities, favored to represent atelectasis. Additionally there is left lower lobe bronchial wall thickening, raising the possibility of associated aspiration. 6. Diffuse wall thickening of the esophagus, most pronounced near the GE junction. While findings may be secondary to esophagitis, underlying distal esophageal mass is not entirely excluded. Consider correlation with EGD.  These results will be called to the ordering clinician or representative by the Radiologist Assistant, and communication documented in the PACS or zVision Dashboard.   Electronically Signed   By: Lovey Newcomer M.D.   On: 03/03/2014 13:20   US Abdomen Limited Ruq  03/03/2014   CLINICAL DATA:  Cirrhosis, diabetes, hypercholesterolemia, abnormal CT exam in 2011 showing a questionable hepatic abnormality  EXAM: US ABDOMEN LIMITED - RIGHT UPPER QUADRANT  COMPARISON:  CT chest 01/05/2010, CT abdomen 08/19/2009  FINDINGS: Gallbladder:  Thickened gallbladder wall. Shadowing calculi and sludge within gallbladder. Gallbladder incompletely distended. No sonographic Murphy sign. Mild amount of pericholecystic fluid.  Common bile duct:  Diameter: Normal caliber 4 mm diameter  Liver:  Increased hepatic echogenicity with nodular margins consistent with cirrhosis. Hepatopetal portal venous flow. Question nodule within RIGHT lobe 3.0 x 1.9 x 2.6 cm. Linear calcification within RIGHT lobe on prior CT exam again identified.  Mild RIGHT renal collecting system dilatation noted.  Mildly heterogeneous  appearance of the pancreas is identified, atrophic, poorly assessed due to overlying bowel.  IMPRESSION: Thickened gallbladder wall nonspecific in the setting of minimal ascites.  Gallstones and sludge within gallbladder without definite sonographic Murphy sign.  If there is persistent clinical concern for acute cholecystitis recommend hepatobiliary imaging.  Cirrhotic appearing liver with linear calcification in RIGHT lobe again identified.  Nonspecific RIGHT lobe liver lesion 3.0 x 1.9 x 2.6 cm, uncertain etiology, not definitely seen in prior exam; recommend followup MR assessment with and without contrast to characterize.   Electronically Signed   By: Lavonia Dana M.D.   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 Multiple imaging studies (CT neck, CT chest, US abdomen) performed since admission with metastatic disease and unknown primary. CA 19-9 elevated at 95.8, AFP tumor marker normal. Oncology consult has been requested. Further imaging (?MRI) deferred to oncology.   Neck nodes to be biopsied today by Dr. Thornton Papas; orders placed for ultrasound-guided core biopsy with cytology. Discussed with Liliane Channel in ultrasound [id number]). Nodes not culprit for dysphagia; esophageal wall thickening on imaging needs further assessment via EGD. Tentative plans for EGD on 12/17.  Per ultrasound, patient may eat/drink; no need for NPO. Will allow full liquids for now, NPO after midnight.   Anemia: multifactorial, iron-deficiency noted. No overt signs of GI bleeding. Hemoccult status unknown but has been ordered. Continue to monitor.     Plan: BID PPI Biopsy of neck today Full liquids (ok per ultrasound) Anticipate EGD with dilation tomorrow, 12/17 NPO after midnight Oncology consult Consider colonoscopy as outpatient Hemoccult stools   Orvil Feil, ANP-BC [facility] G[facility]        LOS: 2 days    03/04/2014, 9:16 AM  Attending note:  Patient seen and examined. Status post neck node biopsy. Tentatively plan for EGD/ED tomorrow.

## 2014-03-04 NOTE — Clinical Documentation Improvement (Signed)
Possible Clinical Conditions?  Severe Malnutrition   Protein Calorie Malnutrition Severe Protein Calorie Malnutrition Other Condition Cannot clinically determine  Supporting Information:(AS PER INITIAL NUTRITION ASSESSMENT by Jeneen Rinks, RD at Mar 11, 2014) [place]  Per approved criteria   -Severe malnutrition in the context of chronic illness  -Underweight   Pt meets criteria for severe MALNUTRITION in the context of chronic illness as evidenced by severe muscle wasting clavicles, patella, quadricep regions, and 15% weight loss within 7 months   INTERVENTION:  Ensure Complete po BID, each supplement provides 350 kcal and 13 grams of protein Follow for nutrition care   NUTRITION DIAGNOSIS:  Inadequate oral intake related to dysphagia? as evidenced by severe wasting clavicles, patella, quadricept regions, 15% wt loss x 7 months   Thank You, Alessandra Grout, RN, BSN, CCDS,Clinical Documentation Specialist:  [phone]  [phone]=[facility] [place]- Health Information Management

## 2014-03-04 NOTE — Progress Notes (Signed)
Consent for EGD and Blood signed by patient with his mark. 2 nurses verified patients understanding.

## 2014-03-04 NOTE — Care Management Note (Addendum)
    Page 1 of 2   03/10/2014     2:45:31 PM CARE MANAGEMENT NOTE 03/10/2014  Patient:  [name], [name]   Account Number:  [id number]  Date Initiated:  03/04/2014  Documentation initiated by:  Theophilus Kinds  Subjective/Objective Assessment:   Pt admitted from home with penumonia. Pt lives with his sister and will return home at discharge. Pt has a cane that he uses prn but stated that he is very independent with ADl's. Pt stated that a nurse from [facility]     Action/Plan:   to draw his blood.Will continue to follow for discharge planning needs. ? [place] at discharge.   Anticipated DC Date:  03/09/2014   Anticipated DC Plan:  [place] Planning Services  CM consult      [facility] Choice  HOME HEALTH  DURABLE MEDICAL EQUIPMENT   Choice offered to / List presented to:  C-1 Patient   DME arranged  [place]      DME agency  [place] arranged  HH-1 RN  [place]      [place] agency  [facility]   Status of service:  Completed, signed off Medicare Important Message given?  YES (If response is "NO", the following Medicare IM given date fields will be blank) Date Medicare IM given:  03/06/2014 Medicare IM given by:  Theophilus Kinds Date Additional Medicare IM given:  03/10/2014 Additional Medicare IM given by:  Theophilus Kinds  Discharge Disposition:  [place]  Per UR Regulation:    If discussed at Long Length of Stay Meetings, dates discussed:   03/10/2014    Comments:  03/10/14 [place], RN BSN CM Pt discharged home today with [facility] (pt already active with them). Referral faxed over and [facility] services to start within 24 hours of discharge. Tube feeding supplies, RW, and BSC ordered from [facility] and will be delivered to pts home at discharge. Pt and pts nurse aware of  discharge arrangements.  03/06/14 [place], RN BSN CM Pt still undergoing testing. Unsure if pt will d/c over the weekend. If pt does d/c, home health would be a benefit for the pt. Weekend staff can arrange with agency of choice.  03/04/14 [place], RN BSN CM

## 2014-03-04 NOTE — Progress Notes (Signed)
Patient returned to floor. Alert and oriented. Dressing to right neck. Clean dry and intact. Vital signs stable.

## 2014-03-04 NOTE — Progress Notes (Addendum)
Inpatient Diabetes Program Recommendations  AACE/ADA: New Consensus Statement on Inpatient Glycemic Control (2013)  Target Ranges:  Prepandial:   less than 140 mg/dL      Peak postprandial:   less than 180 mg/dL (1-2 hours)      Critically ill patients:  140 - 180 mg/dL   Results for TRAVOR, ROYCE (MRN [id number]) as of 03/04/2014 09:58  Ref. Range 03/03/2014 07:11 03/03/2014 11:35 03/03/2014 12:23 03/03/2014 16:32 03/03/2014 21:44 03/04/2014 08:16  Glucose-Capillary Latest Range: 70-99 mg/dL 128 (H) 59 (L) 130 (H) 181 (H) 304 (H) 156 (H)   Diabetes history: DM2 Outpatient Diabetes medications: Glipizide 10 mg BID, Levemir 50 units QHS, Novolog 16 units QAM (Noted patient reports that he has not been taking insulin. He is only taking oral DM medication) Current orders for Inpatient glycemic control: Novolog 0-9 units AC  Inpatient Diabetes Program Recommendations Correction (SSI): Please consider adding Novolog bedtime correction scale. Insulin - Meal Coverage: Please consider ordering Novolog 3 units TID with meals if patient eats at least 50% of meal.  Thanks, Barnie Alderman, RN, MSN, CCRN, CDE Diabetes Coordinator Inpatient Diabetes Program [phone] (Team Pager) [phone] (AP office) [phone] [facility] office)

## 2014-03-04 NOTE — Evaluation (Signed)
Physical Therapy Evaluation Patient Details Name: CHERYL STABENOW MRN: [id number] DOB: Jul 29, 1950 Today's Date: 03/04/2014   History of Present Illness  Pt is a 63 year old male who reports a Cough and SOB ongoing for 2 wks. Associated w/ nausia. Denies fevers, CP, palpitaitons, syncope, HA diarrhea. Intermittent rinorrhea. Seen in the ED on 02/28/14 and prescribed Levaquin for possible Pneumonia. Friend was supposed to pick up medicine for pt but did not. . Friend witnessed pt apparently choking on food and called EMS. No loss of consciousness. Pt reports neck fullness and difficulty swallowing food from time to time. Pt reports being homeless. Pt primarily lives off of [address]  Currently pt reports living with his sister.  Clinical Impression  Pt is a 63 year old male who presents to PT with dx of CAP.  Noted in chart, question of homelessness vs. living with sister; spoke with social work who confirmed pt lives with sister and housing situation is correct (toilet is detached from home "in the woods" per patient).  During evaluation, pt was able to appropriately answer most questions, though did have periods of confusion or difficulty following commands.  Pt was able to state name, DOB, city, state, and month, though did need some prompting for year.  Pt was mod (I) with bed mobility skills, transfers, and gait with personal std cane.  Pt was able to ascend/descend 4 steps, which pt reports he has to enter the porch of the home, with use of 1 handrail.  Noted step through pattern to ascend and step to pattern to descend.  Questionable vision/perception in Lt > Rt eye, as pt did have some difficulty making turns to the Lt (did not always allow adequate space for turns requiring verbal cueing to avoid walls/obstacles of cleaning cart in hallways) and when coming out of dim stairway pt put both hands in front of him to search for obstacles (taking cane off the ground).  From a physical  therapy point of view, pt is at baseline level of function with use of std cane for gait and will be d/c from acute PT services.   No follow up PT or DME recommendations.     Follow Up Recommendations No PT follow up    Equipment Recommendations  None recommended by PT       Precautions / Restrictions Precautions Precautions: Fall Restrictions Weight Bearing Restrictions: No      Mobility  Bed Mobility Overal bed mobility: Modified Independent                Transfers Overall transfer level: Modified independent Equipment used: Straight cane             General transfer comment: Pt did take 2 attempts to transfer to standing.   Ambulation/Gait Ambulation/Gait assistance: Modified independent (Device/Increase time) Ambulation Distance (Feet): 450 Feet Assistive device: Straight cane Gait Pattern/deviations: Step-through pattern;Decreased dorsiflexion - right;Decreased dorsiflexion - left   Gait velocity interpretation: Below normal speed for age/gender    Stairs Stairs: Yes Stairs assistance: Supervision Stair Management: One rail Right Number of Stairs: 4 General stair comments: Pt was able to ascend stairs with step through pattern, and descend stairs with step to pattern    Balance Overall balance assessment: No apparent balance deficits (not formally assessed)  Pertinent Vitals/Pain Pain Assessment: Faces Faces Pain Scale: Hurts a little bit Pain Location: Rt side of throat Pain Intervention(s): Limited activity within patient's tolerance    Home Living Family/patient expects to be discharged to:: Private residence Living Arrangements: Other relatives (Reports living with sister primarily.  Pt also reports baby brother, niece and nephew live with him?  and cousin lives nearby.) Available Help at Discharge: Family;Available PRN/intermittently (Pt reports sister works at a "rest  home") Type of Home: House (Pt reports they rent a house) Home Access: Stairs to enter Entrance Stairs-Rails: Can reach both Entrance Stairs-Number of Steps: 4 Home Layout: One level [place]: [place] - single point Additional Comments: No bathroom in home, pt reports he uses the bathroom in the woods (per sister the bathroom is detached).     Prior Function Level of Independence: Independent with assistive device(s)               Hand Dominance   Dominant Hand: Right    Extremity/Trunk Assessment               Lower Extremity Assessment: Overall WFL for tasks assessed         Communication      Cognition Arousal/Alertness: Awake/alert Behavior During Therapy: WFL for tasks assessed/performed Overall Cognitive Status: No family/caregiver present to determine baseline cognitive functioning                       Assessment/Plan    PT Assessment Patent does not need any further PT services  PT Diagnosis     PT Problem List    PT Treatment Interventions     PT Goals (Current goals can be found in the Care Plan section) Acute Rehab PT Goals PT Goal Formulation: All assessment and education complete, DC therapy     End of Session Equipment Utilized During Treatment: Gait belt Activity Tolerance: Patient tolerated treatment well Patient left: in bed;with call bell/phone within reach;with bed alarm set Nurse Communication: Mobility status         Time: 7680-8811 PT Time Calculation (min) (ACUTE ONLY): 26 min   Charges:   PT Evaluation $Initial PT Evaluation Tier I: 1 Procedure PT Treatments $Therapeutic Activity: 8-22 mins   Dann Ventress 03/04/2014, 9:08 AM

## 2014-03-04 NOTE — Consult Note (Signed)
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 by neck CT  EXAM: ULTRASOUND OF HEAD/NECK SOFT TISSUES  TECHNIQUE: Ultrasound examination of the head and neck soft tissues was performed in the area of clinical concern.  COMPARISON:  None  FINDINGS: In the RIGHT cervical region, 3 soft tissue masses are identified likely representing enlarged lymph nodes.  More superior nodule is heterogeneous with areas of hyper echogenicity to hypoechoic question complex cystic change, 4.1 x 2.6 x 2.8 cm, containing internal blood flow on color Doppler imaging.  A more in inferior nodule  measures 4.2 x 2.4 x 2.7 cm, is predominantly iso- to hypoechoic with mild heterogeneity, with more pronounced internal blood flow.  A smaller inferior RIGHT side nodule is identified measuring  1.5 x 1.1 x 1.0 cm, homogeneous relatively isoechoic.  In addition, an enlarged LEFT cervical node is identified, 2.3 x 3.6 x 1.8 cm, heterogeneous in echogenicity and containing internal blood flow.  The 3 largest nodules contain tiny echogenic foci which likely represent tiny calcifications.  IMPRESSION: Multiple soft tissue nodules within the neck as above most likely representing enlarged abnormal appearing cervical lymph nodes.   Electronically Signed   By: Lavonia Dana M.D.   On: 03/04/2014 14:19   US Abdomen Limited Ruq  03/03/2014   CLINICAL DATA:  Cirrhosis, diabetes, hypercholesterolemia, abnormal CT exam in 2011 showing a questionable hepatic abnormality  EXAM: US ABDOMEN LIMITED - RIGHT UPPER QUADRANT  COMPARISON:  CT chest 01/05/2010, CT abdomen 08/19/2009  FINDINGS: Gallbladder:  Thickened gallbladder wall. Shadowing calculi and sludge within gallbladder. Gallbladder incompletely distended. No sonographic Murphy sign. Mild amount of pericholecystic fluid.  Common bile duct:  Diameter: Normal caliber 4 mm diameter  Liver:  Increased hepatic echogenicity with nodular margins consistent with cirrhosis. Hepatopetal portal venous flow. Question nodule within RIGHT lobe 3.0 x 1.9 x 2.6 cm. Linear calcification within RIGHT lobe on prior CT exam again identified.  Mild RIGHT renal collecting system dilatation noted.  Mildly heterogeneous appearance of the pancreas is identified, atrophic, poorly assessed due to overlying bowel.  IMPRESSION: Thickened gallbladder wall nonspecific in the setting of minimal ascites.  Gallstones and sludge within gallbladder without definite sonographic Murphy sign.  If there is persistent clinical concern for acute cholecystitis recommend hepatobiliary imaging.  Cirrhotic appearing liver with linear calcification in RIGHT lobe again identified.  Nonspecific RIGHT lobe liver lesion 3.0 x 1.9 x 2.6 cm, uncertain etiology, not definitely seen in prior exam; recommend followup MR  assessment with and without contrast to characterize.   Electronically Signed   By: Lavonia Dana M.D.   On: 03/03/2014 14:35       PATHOLOGY:  PENDING   ASSESSMENT:  1. Metastatic disease, primary unknown, but biopsy results are pending. 2. Anemia of chronic disease with possible element of iron deficiency. 3. ECOG performance status of 1 4. Cocci in clusters on blood culture, ?contamination versus bacteremia?  Patient Active Problem List   Diagnosis Date Noted  . Severe malnutrition 03/04/2014  . Bacteremia 03/04/2014  . Hypoglycemia associated with diabetes 03/03/2014  . Cervical lymphadenopathy 03/03/2014  . Tobacco abuse 03/03/2014  . Anemia 03/03/2014  . Cirrhosis 03/03/2014  . Adenopathy, cervical 03/03/2014  . Hypomagnesemia 03/03/2014  . Neck mass 03/03/2014  . Liver mass 03/03/2014  . Metastatic disease 03/03/2014  . CAP (community acquired pneumonia) 03/02/2014  . Diabetes type 2, uncontrolled 03/02/2014  . Hyperlipidemia 03/02/2014  . Dysphagia 03/02/2014  . Hypokalemia 03/02/2014  . Inguinal hernia recurrent unilateral 12/19/2010  . Seizure disorder 12/19/2010  . HTN (hypertension) 12/19/2010     PLAN:  1. I personally reviewed and went over laboratory results with the patient.  The results are noted within this dictation. 2. I personally reviewed and went over radiographic studies with the patient.  The results are noted within this dictation.   3. 2 unit PRBC transfusion 4. Recommend Nutritional/dietician consult 5. Recommend speech pathology consult for consideration of aspiration pneumonia 6. Continue DVT prophylaxis 7. We will follow as an inpatient and make treatment recommendations following pathology results.  8.  He will need outpatient follow-up at the [facility] 9. Given his reasonable performance status, he is a candidate for palliative chemotherapy.  We will await pathology report before speaking of these options.   All questions  were answered. The patient knows to call the clinic with any problems, questions or concerns. We can certainly see the patient much sooner if necessary.  Patient and plan discussed with Dr. Farrel Gobble and he is in agreement with the aforementioned.   KEFALAS,THOMAS  03/04/2014

## 2014-03-04 NOTE — Clinical Social Work Note (Signed)
PT notified CSW that pt was saying he did not have a bathroom in the house, but also changed some information and thought it was 1900 something. CSW called pt's sister who reports that pt does have some brief periods of confusion, but otherwise does well. They do have a detached bathroom, but not one in the home. She said they have lived together for 20 years and home environment is sufficient.   [name], [name]

## 2014-03-05 ENCOUNTER — Encounter (HOSPITAL_COMMUNITY): Payer: Self-pay | Admitting: Internal Medicine

## 2014-03-05 ENCOUNTER — Encounter (HOSPITAL_COMMUNITY)
Admission: EM | Disposition: A | Discharge status: Home Health | Payer: Self-pay | Source: Home / Self Care | Attending: Internal Medicine

## 2014-03-05 ENCOUNTER — Inpatient Hospital Stay (HOSPITAL_COMMUNITY): Payer: Medicare Other

## 2014-03-05 DIAGNOSIS — D649 Anemia, unspecified: Secondary | ICD-10-CM

## 2014-03-05 DIAGNOSIS — R933 Abnormal findings on diagnostic imaging of other parts of digestive tract: Secondary | ICD-10-CM

## 2014-03-05 HISTORY — PX: ESOPHAGOGASTRODUODENOSCOPY: SHX5428

## 2014-03-05 HISTORY — PX: SAVORY DILATION: SHX5439

## 2014-03-05 HISTORY — PX: MALONEY DILATION: SHX5535

## 2014-03-05 LAB — CBC
HCT: 36.9 % — ABNORMAL LOW (ref 39.0–52.0)
HEMOGLOBIN: 12 g/dL — AB (ref 13.0–17.0)
MCH: 23.7 pg — AB (ref 26.0–34.0)
MCHC: 32.5 g/dL (ref 30.0–36.0)
MCV: 72.8 fL — AB (ref 78.0–100.0)
Platelets: 324 10*3/uL (ref 150–400)
RBC: 5.07 MIL/uL (ref 4.22–5.81)
RDW: 17.4 % — ABNORMAL HIGH (ref 11.5–15.5)
WBC: 14.9 10*3/uL — ABNORMAL HIGH (ref 4.0–10.5)

## 2014-03-05 LAB — BASIC METABOLIC PANEL
Anion gap: 9 (ref 5–15)
BUN: 6 mg/dL (ref 6–23)
CALCIUM: 8.7 mg/dL (ref 8.4–10.5)
CO2: 29 meq/L (ref 19–32)
CREATININE: 0.72 mg/dL (ref 0.50–1.35)
Chloride: 97 mEq/L (ref 96–112)
GFR calc Af Amer: >90 mL/min (ref >90)
GLUCOSE: 246 mg/dL — AB (ref 70–99)
Potassium: 5.1 mEq/L (ref 3.7–5.3)
Sodium: 135 mEq/L — ABNORMAL LOW (ref 137–147)

## 2014-03-05 LAB — GLUCOSE, CAPILLARY
GLUCOSE-CAPILLARY: 240 mg/dL — AB (ref 70–99)
GLUCOSE-CAPILLARY: 137 mg/dL — AB (ref 70–99)
GLUCOSE-CAPILLARY: 234 mg/dL — AB (ref 70–99)
Glucose-Capillary: 216 mg/dL — ABNORMAL HIGH (ref 70–99)
Glucose-Capillary: 237 mg/dL — ABNORMAL HIGH (ref 70–99)

## 2014-03-05 LAB — CULTURE, BLOOD (ROUTINE X 2)

## 2014-03-05 SURGERY — EGD (ESOPHAGOGASTRODUODENOSCOPY)
Anesthesia: Moderate Sedation

## 2014-03-05 MED ORDER — SODIUM CHLORIDE 0.9 % IJ SOLN
INTRAMUSCULAR | Status: AC
Start: 2014-03-05 — End: 2014-03-06
  Filled 2014-03-05: qty 600

## 2014-03-05 MED ORDER — IOHEXOL 300 MG/ML  SOLN
50.0000 mL | Freq: Once | INTRAMUSCULAR | Status: AC | PRN
  Administered 2014-03-05: 50 mL via ORAL

## 2014-03-05 MED ORDER — MIDAZOLAM HCL 5 MG/5ML IJ SOLN
INTRAMUSCULAR | Status: AC
  Filled 2014-03-05: qty 10

## 2014-03-05 MED ORDER — VITAMIN B-1 100 MG PO TABS
100.0000 mg | ORAL_TABLET | Freq: Every day | ORAL | Status: DC
  Administered 2014-03-05 – 2014-03-10 (×6): 100 mg via ORAL
  Filled 2014-03-05 (×6): qty 1

## 2014-03-05 MED ORDER — LORAZEPAM 1 MG PO TABS
1.0000 mg | ORAL_TABLET | Freq: Four times a day (QID) | ORAL | Status: DC | PRN
  Administered 2014-03-05: 1 mg via ORAL
  Filled 2014-03-05: qty 1

## 2014-03-05 MED ORDER — THIAMINE HCL 100 MG/ML IJ SOLN: 100.0000 mg | Freq: Every day | INTRAMUSCULAR | Status: DC

## 2014-03-05 MED ORDER — MEPERIDINE HCL 100 MG/ML IJ SOLN
INTRAMUSCULAR | Status: DC | PRN
  Administered 2014-03-05: 25 mg via INTRAVENOUS

## 2014-03-05 MED ORDER — MEPERIDINE HCL 100 MG/ML IJ SOLN
INTRAMUSCULAR | Status: AC
  Filled 2014-03-05: qty 2

## 2014-03-05 MED ORDER — SODIUM CHLORIDE 0.9 % IV SOLN
INTRAVENOUS | Status: DC
  Administered 2014-03-05: 14:00:00 via INTRAVENOUS

## 2014-03-05 MED ORDER — LIDOCAINE VISCOUS 2 % MT SOLN
OROMUCOSAL | Status: AC
  Filled 2014-03-05: qty 15

## 2014-03-05 MED ORDER — STERILE WATER FOR IRRIGATION IR SOLN
Status: DC | PRN
  Administered 2014-03-05: 14:00:00

## 2014-03-05 MED ORDER — LORAZEPAM 2 MG/ML IJ SOLN: 1.0000 mg | Freq: Four times a day (QID) | INTRAMUSCULAR | Status: DC | PRN

## 2014-03-05 MED ORDER — SUCRALFATE 1 GM/10ML PO SUSP
1.0000 g | Freq: Three times a day (TID) | ORAL | Status: DC
  Administered 2014-03-05 – 2014-03-07 (×9): 1 g via ORAL
  Filled 2014-03-05 (×9): qty 10

## 2014-03-05 MED ORDER — ONDANSETRON HCL 4 MG/2ML IJ SOLN
INTRAMUSCULAR | Status: DC | PRN
  Administered 2014-03-05: 4 mg via INTRAVENOUS

## 2014-03-05 MED ORDER — INSULIN DETEMIR 100 UNIT/ML ~~LOC~~ SOLN
5.0000 [IU] | Freq: Every day | SUBCUTANEOUS | Status: DC
  Administered 2014-03-05 – 2014-03-08 (×4): 5 [IU] via SUBCUTANEOUS
  Filled 2014-03-05 (×5): qty 0.05

## 2014-03-05 MED ORDER — FOLIC ACID 1 MG PO TABS
1.0000 mg | ORAL_TABLET | Freq: Every day | ORAL | Status: DC
  Administered 2014-03-05 – 2014-03-10 (×6): 1 mg via ORAL
  Filled 2014-03-05 (×6): qty 1

## 2014-03-05 MED ORDER — CLINDAMYCIN PHOSPHATE 600 MG/50ML IV SOLN
600.0000 mg | Freq: Three times a day (TID) | INTRAVENOUS | Status: DC
  Administered 2014-03-05 – 2014-03-10 (×14): 600 mg via INTRAVENOUS
  Filled 2014-03-05 (×15): qty 50

## 2014-03-05 MED ORDER — IOHEXOL 300 MG/ML  SOLN
100.0000 mL | Freq: Once | INTRAMUSCULAR | Status: AC | PRN
  Administered 2014-03-05: 100 mL via INTRAVENOUS

## 2014-03-05 MED ORDER — ADULT MULTIVITAMIN W/MINERALS CH
1.0000 | ORAL_TABLET | Freq: Every day | ORAL | Status: DC
  Administered 2014-03-05 – 2014-03-10 (×6): 1 via ORAL
  Filled 2014-03-05 (×6): qty 1

## 2014-03-05 MED ORDER — LIDOCAINE VISCOUS 2 % MT SOLN
OROMUCOSAL | Status: DC | PRN
Start: 2014-03-05 — End: 2014-03-05
  Administered 2014-03-05: 3 mL via OROMUCOSAL

## 2014-03-05 MED ORDER — SODIUM CHLORIDE 0.9 % IJ SOLN
INTRAMUSCULAR | Status: AC
Start: 2014-03-05 — End: 2014-03-06
  Filled 2014-03-05: qty 60

## 2014-03-05 MED ORDER — MIDAZOLAM HCL 5 MG/5ML IJ SOLN
INTRAMUSCULAR | Status: DC | PRN
  Administered 2014-03-05: 2 mg via INTRAVENOUS

## 2014-03-05 MED ORDER — ONDANSETRON HCL 4 MG/2ML IJ SOLN
INTRAMUSCULAR | Status: AC
  Filled 2014-03-05: qty 2

## 2014-03-05 NOTE — Progress Notes (Signed)
Patient going down for EGD at this time.

## 2014-03-05 NOTE — Progress Notes (Signed)
Patient bloog sugar 216. Patient NPO. Discussed with [name] NP. Will hold insulin for now.

## 2014-03-05 NOTE — Progress Notes (Signed)
Subjective:  No complaints.   Objective: Vital signs in last 24 hours: Temp:  [97.9 F (36.6 C)-101.4 F (38.6 C)] 98.8 F (37.1 C) (12/17 0445) Pulse Rate:  [92-131] 103 (12/17 0445) Resp:  [16-24] 16 (12/17 0445) BP: (96-142)/(52-99) 105/59 mmHg (12/17 0445) SpO2:  [90 %-99 %] 90 % (12/17 0702) Last BM Date: 03/03/14 General:   Alert,  Well-developed, well-nourished, pleasant and cooperative in NAD Head:  Normocephalic and atraumatic. Eyes:  Sclera clear, no icterus.  Chest: scattered rhonchi ENT: some white patches on tongue  Heart:  Regular rate and rhythm; no murmurs, clicks, rubs,  or gallops. Abdomen:  Soft, nontender and nondistended. No masses, hepatosplenomegaly or hernias noted. Normal bowel sounds, without guarding, and without rebound.   Extremities:  Without clubbing, deformity or edema. Neurologic:  Alert and  oriented x4;  grossly normal neurologically. Skin:  Intact without significant lesions or rashes. Psych:  Alert and cooperative. Normal mood and affect.  Intake/Output from previous day: 12/16 0701 - 12/17 0700 In: 2695 [P.O.:360; I.V.:1215; Blood:670; IV Piggyback:450] Out: 1000 [Urine:1000] Intake/Output this shift:    Lab Results: CBC  Recent Labs  03/02/14 2023 03/03/14 0549 03/04/14 0609  WBC 14.0* 11.0* 10.4  HGB 9.6* 8.2* 7.9*  HCT 30.5* 26.2* 24.9*  MCV 68.2* 68.2* 69.2*  PLT 379 385 355   BMET  Recent Labs  03/02/14 2023 03/03/14 0549 03/04/14 0609  NA 133* 138 135*  K 3.3* 4.1 4.2  CL 93* 100 97  CO2 28 28 29   GLUCOSE 288* 146* 172*  BUN 5* 4* 3*  CREATININE 0.61 0.55 0.67  CALCIUM 8.7 8.1* 8.2*   LFTs  Recent Labs  03/03/14 0549  BILITOT 0.3  ALKPHOS 146*  AST 13  ALT 6  PROT 6.1  ALBUMIN 2.1*   No results for input(s): LIPASE in the last 72 hours. PT/INR No results for input(s): LABPROT, INR in the last 72 hours.    Imaging Studies: Dg Chest 2 View  03/02/2014   CLINICAL DATA:  Choking on food  denied  EXAM: CHEST  2 VIEW  COMPARISON:  02/28/2014  FINDINGS: Right hemidiaphragm remains elevated. Nodular density now projects over the inferior right hilum. Hazy airspace disease at the right base. Left lung is clear. Chronic right-sided rib deformities.  IMPRESSION: Hazy right lower lobe airspace disease.  New nodular density at the right lung base is indeterminate. Followup studies to ensure resolution are recommended. If the abnormality fails to resolve, CT is recommended.   Electronically Signed   By: Maryclare Bean M.D.   On: 03/02/2014 20:03   Dg Chest 2 View  02/28/2014   CLINICAL DATA:  Cough and congestion for several days.  EXAM: CHEST  2 VIEW  COMPARISON:  05/23/2010  FINDINGS: Mild lung base opacity is noted, greater on the right. This may all be atelectasis. Pneumonia is possible but felt less likely. Elevation the right hemidiaphragm is stable.  No evidence of pulmonary edema. No pleural effusion or pneumothorax.  Cardiac silhouette is normal in size. Normal mediastinal and hilar contours.  Bony thorax is demineralized but intact.  IMPRESSION: 1. Lung base opacity, right greater than left, likely atelectasis. Right lower lobe pneumonia is possible, but felt less likely. 2. Chronic mild elevation the right hemidiaphragm. No pulmonary edema.   Electronically Signed   By: Lajean Manes M.D.   On: 02/28/2014 14:03   Ct Head Wo Contrast  02/15/2014   CLINICAL DATA:  63 year old male  with headache.  EXAM: CT HEAD WITHOUT CONTRAST  TECHNIQUE: Contiguous axial images were obtained from the base of the skull through the vertex without intravenous contrast.  COMPARISON:  08/21/2008 and 04/29/2008 head CTs  FINDINGS: Generalized cerebral and cerebellar atrophy again noted.  Mild chronic small-vessel white matter ischemic changes again identified.  Bifrontal encephalomalacia is again noted.  No acute intracranial abnormalities are identified, including mass lesion or mass effect, hydrocephalus, extra-axial  fluid collection, midline shift, hemorrhage, or acute infarction.  The visualized bony calvarium is unremarkable.  IMPRESSION: No evidence of acute intracranial abnormality.  Atrophy, chronic small-vessel white matter ischemic changes and bifrontal encephalomalacia.   Electronically Signed   By: Hassan Rowan M.D.   On: 02/15/2014 21:43   Ct Soft Tissue Neck W Contrast  03/03/2014   CLINICAL DATA:  63 year old male with cough, shortness of breath, abnormal neck fullness. Dysphagia for 4-5 months.  EXAM: CT NECK WITH CONTRAST  TECHNIQUE: Multidetector CT imaging of the neck was performed using the standard protocol following the bolus administration of intravenous contrast.  CONTRAST:  86mL OMNIPAQUE IOHEXOL 300 MG/ML SOLN in conjunction with contrast enhanced imaging of the chest reported separately.  COMPARISON:  Noncontrast CTs of the head 02/15/2014 and earlier.  FINDINGS: Chest findings today reported separately.  Study is intermittently degraded by motion artifact despite repeated imaging attempts.  Bulky and confluent cystic/necrotic soft tissue mass is in the right neck. A conglomeration at the right level 2 nodal station measures up to 45 mm largest dimension. More solid enhancing nodal/ex nodal disease tracking to the right level 4 station. At the level of the lateral wall of the oropharynx there is asymmetric but indistinct soft tissue thickening of the pharynx up to 9 mm (series 8, image 43). Some of the abnormal cystic/necrotic soft tissue mass extends to abut the enter surface of the ramus of the right mandible (series 8, image 44). And there is motion artifact in the region of the mandible. There are prior ORIF changes to the right mandible ramus. Dentition is absent throughout. There is sclerosis of the posterior body of the right mandible, but no bony destruction or erosion identified.  There are smaller cystic/necrotic masses in the left head and neck including at the left level 1B nodal station  (series 8, image 54). There is conglomerate involvement at the left Level 3 station. The left level 2 station is spared. There is a mildly enlarged solid-appearing node at the level 1 A station, 10 mm short axis. The largest of these masses measure up to 3.9 cm largest dimension.  Negative thyroid. Larynx is within normal limits. Negative retropharyngeal space. Negative sublingual space. Negative submandibular and parotid glands except for regional mass effect and right submandibular ductal ectasia. Visualized orbit soft tissues are within normal limits. Grossly negative visible brain parenchyma.  Chronic inflammation of the left maxillary sinus, left middle ear and mastoid, with mucoperiosteal thickening in those regions. There may be a small accessory tooth in the anterior left maxillary sinus (series 17, image 28).  Both carotid and vertebral arteries in the neck remain patent. The right IJ is in dated and thrombosed. The bulky right neck disease is inseparable from the right carotid bifurcation, and tracks medially through the splayed right external and internal carotid arteries. The left IJ is compressed but remains patent.  No acute or suspicious osseous lesion identified. Degenerative changes in the cervical spine.  IMPRESSION: 1. 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 This invades and occludes the right internal jugular vein, and encases the right carotid bifurcation. 2. Left level 1 and level 3 nodal station involvement as well. 3. Primary tumor site is uncertain, but might be the right tonsillar pillar. 4. Chest CT findings today reported separately.   Electronically Signed   By: Lars Pinks M.D.   On: 03/03/2014 12:55   Ct Chest W Contrast  03/03/2014   CLINICAL DATA:  Cough, shortness of breath. Neck fullness. Dysphagia.  EXAM: CT CHEST WITH CONTRAST  TECHNIQUE: Multidetector CT imaging of the  chest was performed during intravenous contrast administration.  CONTRAST:  29mL OMNIPAQUE IOHEXOL 300 MG/ML  SOLN  COMPARISON:  Chest CT 01/05/2010  FINDINGS: Neck findings reported separately.  Visualized thyroid is unremarkable. No axillary or mediastinal lymphadenopathy. Bilateral lower neck adenopathy, see dedicated neck CT report for description. Normal heart size. No pericardial effusion. Aorta and main pulmonary artery are normal in caliber.  Central airways are patent. There is a 1.9 x 1.7 cm nodule within the right lower lobe (image 34; series 3). There is an adjacent 0.6 cm right lower lobe nodule (image 34; series 3). There is a 0.5 cm right upper lobe nodule (image 21; series 3). There is a 3 mm right upper lobe nodule (image 15; series 3). Dependent ground-glass and consolidative opacity demonstrated within the right and left lower lobes. Additionally there is left lower lobe bronchial wall thickening. No pleural effusion or pneumothorax.  Visualization of the upper abdomen demonstrates a centrally necrotic enhancing mass within the hepatic dome measuring 2.8 x 2.9 cm. There is an additional 0.7 cm low-attenuation lesion within the hepatic dome (image 44; series 2). There is an additional 1.9 x 1.9 cm peripherally enhancing centrally necrotic lesion within the hepatic dome (image 40; series 2). Re- demonstrated hepatic parenchymal calcifications.  Mild heterogeneity along the inferior margin of right hepatic lobe. This is incompletely evaluated and focal lesion at this location is not excluded. Small amount of perihepatic fluid. Small hiatal hernia. Diffuse esophageal wall thickening, most pronounced near the junction.  There is dilatation of the main pancreatic duct measuring up to 11 mm.  Multiple old posterior rib fractures. No aggressive or acute appearing osseous lesions. Degenerative changes of the lower thoracic and lumbar vertebral bodies. Gynecomastia.  IMPRESSION: 1. Multiple pulmonary  nodules, concerning for metastatic disease. 2. Multiple centrally necrotic peripherally enhancing hepatic masses, concerning for metastatic disease. 3. Marked dilatation of the main pancreatic duct measuring up to 11 mm. The causative etiology is not identified on current evaluation as the entire pancreas is not included. Recommend dedicated evaluation with contrast-enhanced CT or potentially MRI. 4. Bilateral lower cervical adenopathy, most compatible with metastatic disease. See dedicated neck CT report. 5. Bilateral lower lobe ground-glass opacities, favored to represent atelectasis. Additionally there is left lower lobe bronchial wall thickening, raising the possibility of associated aspiration. 6. Diffuse wall thickening of the esophagus, most pronounced near the GE junction. While findings may be secondary to esophagitis, underlying distal esophageal mass is not entirely excluded. Consider correlation with EGD.  These results will be called to the ordering clinician or representative by the Radiologist Assistant, and communication documented in the PACS or zVision Dashboard.   Electronically Signed   By: Lovey Newcomer M.D.   On: 03/03/2014 13:20   US Soft Tissue Head/neck  03/04/2014   CLINICAL DATA:  Neck mass, suspected cervical adenopathy by neck  CT  EXAM: ULTRASOUND OF HEAD/NECK SOFT TISSUES  TECHNIQUE: Ultrasound examination of the head and neck soft tissues was performed in the area of clinical concern.  COMPARISON:  None  FINDINGS: In the RIGHT cervical region, 3 soft tissue masses are identified likely representing enlarged lymph nodes.  More superior nodule is heterogeneous with areas of hyper echogenicity to hypoechoic question complex cystic change, 4.1 x 2.6 x 2.8 cm, containing internal blood flow on color Doppler imaging.  A more in inferior nodule measures 4.2 x 2.4 x 2.7 cm, is predominantly iso- to hypoechoic with mild heterogeneity, with more pronounced internal blood flow.  A smaller  inferior RIGHT side nodule is identified measuring 1.5 x 1.1 x 1.0 cm, homogeneous relatively isoechoic.  In addition, an enlarged LEFT cervical node is identified, 2.3 x 3.6 x 1.8 cm, heterogeneous in echogenicity and containing internal blood flow.  The 3 largest nodules contain tiny echogenic foci which likely represent tiny calcifications.  IMPRESSION: Multiple soft tissue nodules within the neck as above most likely representing enlarged abnormal appearing cervical lymph nodes.   Electronically Signed   By: Lavonia Dana M.D.   On: 03/04/2014 14:19   US Guided Needle Placement  03/04/2014   CLINICAL DATA:  Cervical adenopathy  EXAM: ULTRASOUND GUIDED CORE NEEDLE BIOPSY OF A RIGHT CERVICAL NODE  COMPARISON:  Previous exams.  FINDINGS: Procedure, risks, benefits and alternatives discussed with the patient.  Patient's questions answered.  Written informed consent for core biopsy of the enlarged RIGHT cervical lymph nodes was obtained.  Time-out protocol followed.  Dominant nodule in the inferior RIGHT cervical region localized by ultrasound  Nodule measures 4.2 x 2.4 x 2.7 cm.  Skin prepped and draped in usual sterile fashion.  Skin and overlying soft tissues anesthetized with 3 mL of 2% lidocaine lidocaine.  Under direct sonographic visualization, three 14 gauge core biopsies of the targeted enlarged RIGHT cervical lymph node were performed.  Ultrasound visualization was utilized to confirm core sample acquisition within the targeted lymph node.  Obtained tissue cores appeared adequate and were placed in the appropriate container.  Procedure tolerated well by patient.  Specimen sent to pathology for evaluation.  No evidence of hematoma on post procedural imaging.  IMPRESSION: Three ultrasound guided core biopsies of an enlarged RIGHT cervical lymph node as above.  No acute abnormalities.   Electronically Signed   By: Lavonia Dana M.D.   On: 03/04/2014 15:53   [place] Core Biopsy  03/04/2014   CLINICAL DATA:   Cervical adenopathy  EXAM: ULTRASOUND GUIDED CORE NEEDLE BIOPSY OF A RIGHT CERVICAL NODE  COMPARISON:  Previous exams.  FINDINGS: Procedure, risks, benefits and alternatives discussed with the patient.  Patient's questions answered.  Written informed consent for core biopsy of the enlarged RIGHT cervical lymph nodes was obtained.  Time-out protocol followed.  Dominant nodule in the inferior RIGHT cervical region localized by ultrasound  Nodule measures 4.2 x 2.4 x 2.7 cm.  Skin prepped and draped in usual sterile fashion.  Skin and overlying soft tissues anesthetized with 3 mL of 2% lidocaine lidocaine.  Under direct sonographic visualization, three 14 gauge core biopsies of the targeted enlarged RIGHT cervical lymph node were performed.  Ultrasound visualization was utilized to confirm core sample acquisition within the targeted lymph node.  Obtained tissue cores appeared adequate and were placed in the appropriate container.  Procedure tolerated well by patient.  Specimen sent to pathology for evaluation.  No evidence of hematoma on post procedural imaging.  IMPRESSION: Three ultrasound guided core biopsies of an enlarged RIGHT cervical lymph node as above.  No acute abnormalities.   Electronically Signed   By: Lavonia Dana M.D.   On: 03/04/2014 15:53   US Abdomen Limited Ruq  03/03/2014   CLINICAL DATA:  Cirrhosis, diabetes, hypercholesterolemia, abnormal CT exam in 2011 showing a questionable hepatic abnormality  EXAM: US ABDOMEN LIMITED - RIGHT UPPER QUADRANT  COMPARISON:  CT chest 01/05/2010, CT abdomen 08/19/2009  FINDINGS: Gallbladder:  Thickened gallbladder wall. Shadowing calculi and sludge within gallbladder. Gallbladder incompletely distended. No sonographic Murphy sign. Mild amount of pericholecystic fluid.  Common bile duct:  Diameter: Normal caliber 4 mm diameter  Liver:  Increased hepatic echogenicity with nodular margins consistent with cirrhosis. Hepatopetal portal venous flow. Question nodule  within RIGHT lobe 3.0 x 1.9 x 2.6 cm. Linear calcification within RIGHT lobe on prior CT exam again identified.  Mild RIGHT renal collecting system dilatation noted.  Mildly heterogeneous appearance of the pancreas is identified, atrophic, poorly assessed due to overlying bowel.  IMPRESSION: Thickened gallbladder wall nonspecific in the setting of minimal ascites.  Gallstones and sludge within gallbladder without definite sonographic Murphy sign.  If there is persistent clinical concern for acute cholecystitis recommend hepatobiliary imaging.  Cirrhotic appearing liver with linear calcification in RIGHT lobe again identified.  Nonspecific RIGHT lobe liver lesion 3.0 x 1.9 x 2.6 cm, uncertain etiology, not definitely seen in prior exam; recommend followup MR assessment with and without contrast to characterize.   Electronically Signed   By: Lavonia Dana M.D.   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 Multiple imaging studies (CT neck, CT chest, US abdomen) performed since admission with metastatic disease and unknown primary. CA 19-9 elevated at 95.8, AFP tumor marker normal. Oncology consult appreciated. Neck node biopsy completed yesterday, results pending. Also with esophageal wall thickening on imaging and EGD tentatively planned for today. Also with plans for CT A/P with contrast today.   Anemia: multifactorial, iron-deficiency noted. No overt signs of GI bleeding. Hemoccult status unknown but has been ordered. Continue to monitor. Received 1 and 1/2 units of blood over night.  +blood culture one specimen, Staph species, ?contaminant. Currently on levaquin and vancocin  Patient had temp of 101.4 at 2200 last night. It is not clear if he was receiving blood at that time. It appears that fever was prior to the first unit. Afebrile since that  time.   Plan: 1. Discuss with Dr. Gala Romney. Proceed with EGD today/this afternoon.    LOS: 3 days   Neil Crouch  03/05/2014, 8:20 AM  Attending note:  Patient seen and examined in short stay. Agree with need for EGD. Discussed this approach with the patient along with risks, benefits, limitations and alternatives.  Potential for esophageal dilation reviewed. Questions answered. He is agreeable. Further recommendations to follow.

## 2014-03-05 NOTE — Progress Notes (Signed)
Patient returned from EGD and waking up at this time. No diet ordered. Patient has speech evaluation ordered, but they will not be able to see patient today per ST earlier today (due to patient being NPO for EGD). Patient also has CT scan ordered today and needs to drink contrast. Dr. Jerilee Hoh called and situation discussed. Will start patient on full liquid diet until seen by speech. Once patient is fully awake will have contrast brought to floor for patient to have CT scan.

## 2014-03-05 NOTE — Progress Notes (Signed)
[facility] PROGRESS NOTE  [name] ZOX:[id number] DOB: 1951-03-03 DOA: 03/02/2014 PCP: [name], MD  Assessment/Plan: CAP: Max temp 101.4 last night. Maybe related to blood transfusion but he did get slightly hypotensive and tachycardic during this time. Improved at time of my exam.  Blood culture + cocci in clusters, may be contaminant.?  Continue Vancomycin and levaquin day #3. Legionella urine antigen and Strep pneumo urine antigen negative. Obtain ST evaluation to assess for aspiration pneumonia.  oxygen saturation level 98% on room air.    Dysphagia/neck mass: CT neck with "malignancy" with widespread cystic and necrotic X-nodal appearing tumor. s/P biopsy of neck nodules by radiology 03/04/14. Await biopsy results.  He may also need EGD this admission to assess esophageal thickening. appreciate GI note assistance/input. Will request bedside speech eval as well.   Liver mass/lesion: in setting of cirrhosis likely related to ETOH abuse. see CT and abdominal US results. Await CT of abdomen/pelvis.  GI assistance greatly appreciated. Have requested oncology consult as well. See #2.  Pulmonary nodule: multiple per CT . Evaluated by Oncology who opine metastatic disease of unknown primary with biopsy pending and may be candidate for palliative chemo given current performance status. Await pathologies. Will need OP follow up with cancer center.  Bacteremia: +Blood culture. See #1.   Anemia: microcytic. S/P 1.5 units RBC's.  Appreciate GI assistance. See #1  Hypokalemia: resolved. monitor   HTN: fair control. Continue LIsinopril  DM: pt reports taking his sugar pill twice a day but no insulin. Last A1c 2012 18.1. CBG range 119-216. Continue to hold glipizide and lantus. SSI for optimal control. A1c 8.0  HLD:  continue statin  Hyponatremia: resolved. BMET in am  Severe malnutrition likely multifactorial i.e. Long hx ETOH abuse and metastatic disease primary  unknown. Appreciate dietician assistance. Nutritional supplement   Code Status: full Family Communication: none present Disposition Plan: home when ready. Hopefully 24-48 hours   Consultants:  gastroenterology  Oncology  Procedures:  Biopsy 12/16 on neck mass  Antibiotics:  levaquin 03/03/14>>  Vancomycin 03/03/14>>  HPI/Subjective: Alert. Denies pain/discomfort. Reports wanting something to eat  Objective: Filed Vitals:   03/05/14 0823  BP: 134/78  Pulse: 99  Temp: 98.5 F (36.9 C)  Resp: 18    Intake/Output Summary (Last 24 hours) at 03/05/14 0947 Last data filed at 03/05/14 0600  Gross per 24 hour  Intake   2695 ml  Output    700 ml  Net   1995 ml   Filed Weights   03/02/14 2300  Weight: 52.4 kg (115 lb 8.3 oz)    Exam:   General:  Very thin and frail appearing   Cardiovascular: RRR No MGR No LE edema PPP  Respiratory: normal effort with conversation. BS distant and coarse. No wheeze  Abdomen: flat soft +BS non-tender to palpation  Musculoskeletal: muscle wasting, ambulates without assistance   Data Reviewed: Basic Metabolic Panel:  Recent Labs Lab 02/28/14 1435 03/02/14 2023 03/03/14 0549 03/03/14 0841 03/04/14 0609  NA 141 133* 138  --  135*  K 2.8* 3.3* 4.1  --  4.2  CL 98 93* 100  --  97  CO2 29 28 28   --  29  GLUCOSE 92 288* 146*  --  172*  BUN 8 5* 4*  --  3*  CREATININE 0.59 0.61 0.55  --  0.67  CALCIUM 8.8 8.7 8.1*  --  8.2*  MG  --   --   --  1.3*  --  Liver Function Tests:  Recent Labs Lab 03/03/14 0549  AST 13  ALT 6  ALKPHOS 146*  BILITOT 0.3  PROT 6.1  ALBUMIN 2.1*   No results for input(s): LIPASE, AMYLASE in the last 168 hours. No results for input(s): AMMONIA in the last 168 hours. CBC:  Recent Labs Lab 02/28/14 1435 03/02/14 2023 03/03/14 0549 03/04/14 0609  WBC 18.5* 14.0* 11.0* 10.4  NEUTROABS 16.1* 11.4*  --   --   HGB 10.4* 9.6* 8.2* 7.9*  HCT 33.8* 30.5* 26.2* 24.9*  MCV 69.5* 68.2*  68.2* 69.2*  PLT 505* 379 385 355   Cardiac Enzymes: No results for input(s): CKTOTAL, CKMB, CKMBINDEX, TROPONINI in the last 168 hours. BNP (last 3 results) No results for input(s): PROBNP in the last 8760 hours. CBG:  Recent Labs Lab 03/04/14 0816 03/04/14 1119 03/04/14 1651 03/04/14 2153 03/05/14 0809  GLUCAP 156* 209* 347* 119* 216*    Recent Results (from the past 240 hour(s))  Culture, blood (routine x 2) Call MD if unable to obtain prior to antibiotics being given     Status: None (Preliminary result)   Collection Time: 03/02/14 11:23 PM  Result Value Ref Range Status   Specimen Description BLOOD RIGHT ANTECUBITAL  Final   Special Requests BOTTLES DRAWN AEROBIC AND ANAEROBIC [place]  Final   Culture NO GROWTH 2 DAYS  Final   Report Status PENDING  Incomplete  Culture, blood (routine x 2) Call MD if unable to obtain prior to antibiotics being given     Status: None (Preliminary result)   Collection Time: 03/02/14 11:23 PM  Result Value Ref Range Status   Specimen Description BLOOD RIGHT ANTECUBITAL  Final   Special Requests   Final    BOTTLES DRAWN AEROBIC AND ANAEROBIC AEB=6CC ANA=4CC   Culture  Setup Time   Final    03/04/2014 14:50 Performed at [place]   Final    [place] [facility] CL[facility] Note: Gram Stain Report Called to,Read Back By and Verified With: THOMAS [id number]  ON 03/03/14 BY FORSYTHK Performed at [facility] Performed at [facility]    Report Status PENDING  Incomplete  MRSA PCR Screening     Status: None   Collection Time: 03/03/14 12:10 AM  Result Value Ref Range Status   MRSA by PCR NEGATIVE NEGATIVE Final    Comment:        The GeneXpert MRSA Assay (FDA approved for NASAL specimens only), is one component of a comprehensive MRSA colonization surveillance program. It is not intended to diagnose MRSA infection nor to guide or monitor treatment for MRSA infections.      Studies: Ct Soft  Tissue Neck W Contrast  03/03/2014   CLINICAL DATA:  64 year old male with cough, shortness of breath, abnormal neck fullness. Dysphagia for 4-5 months.  EXAM: CT NECK WITH CONTRAST  TECHNIQUE: Multidetector CT imaging of the neck was performed using the standard protocol following the bolus administration of intravenous contrast.  CONTRAST:  44mL OMNIPAQUE IOHEXOL 300 MG/ML SOLN in conjunction with contrast enhanced imaging of the chest reported separately.  COMPARISON:  Noncontrast CTs of the head 02/15/2014 and earlier.  FINDINGS: Chest findings today reported separately.  Study is intermittently degraded by motion artifact despite repeated imaging attempts.  Bulky and confluent cystic/necrotic soft tissue mass is in the right neck. A conglomeration at the right level 2 nodal station measures up to 45 mm largest dimension. More solid enhancing  nodal/ex nodal disease tracking to the right level 4 station. At the level of the lateral wall of the oropharynx there is asymmetric but indistinct soft tissue thickening of the pharynx up to 9 mm (series 8, image 43). Some of the abnormal cystic/necrotic soft tissue mass extends to abut the enter surface of the ramus of the right mandible (series 8, image 44). And there is motion artifact in the region of the mandible. There are prior ORIF changes to the right mandible ramus. Dentition is absent throughout. There is sclerosis of the posterior body of the right mandible, but no bony destruction or erosion identified.  There are smaller cystic/necrotic masses in the left head and neck including at the left level 1B nodal station (series 8, image 54). There is conglomerate involvement at the left Level 3 station. The left level 2 station is spared. There is a mildly enlarged solid-appearing node at the level 1 A station, 10 mm short axis. The largest of these masses measure up to 3.9 cm largest dimension.  Negative thyroid. Larynx is within normal limits. Negative  retropharyngeal space. Negative sublingual space. Negative submandibular and parotid glands except for regional mass effect and right submandibular ductal ectasia. Visualized orbit soft tissues are within normal limits. Grossly negative visible brain parenchyma.  Chronic inflammation of the left maxillary sinus, left middle ear and mastoid, with mucoperiosteal thickening in those regions. There may be a small accessory tooth in the anterior left maxillary sinus (series 17, image 28).  Both carotid and vertebral arteries in the neck remain patent. The right IJ is in dated and thrombosed. The bulky right neck disease is inseparable from the right carotid bifurcation, and tracks medially through the splayed right external and internal carotid arteries. The left IJ is compressed but remains patent.  No acute or suspicious osseous lesion identified. Degenerative changes in the cervical spine.  IMPRESSION: 1. 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 This invades and occludes the right internal jugular vein, and encases the right carotid bifurcation. 2. Left level 1 and level 3 nodal station involvement as well. 3. Primary tumor site is uncertain, but might be the right tonsillar pillar. 4. Chest CT findings today reported separately.   Electronically Signed   By: Lars Pinks M.D.   On: 03/03/2014 12:55   Ct Chest W Contrast  03/03/2014   CLINICAL DATA:  Cough, shortness of breath. Neck fullness. Dysphagia.  EXAM: CT CHEST WITH CONTRAST  TECHNIQUE: Multidetector CT imaging of the chest was performed during intravenous contrast administration.  CONTRAST:  7mL OMNIPAQUE IOHEXOL 300 MG/ML  SOLN  COMPARISON:  Chest CT 01/05/2010  FINDINGS: Neck findings reported separately.  Visualized thyroid is unremarkable. No axillary or mediastinal lymphadenopathy. Bilateral lower neck adenopathy, see dedicated neck CT report  for description. Normal heart size. No pericardial effusion. Aorta and main pulmonary artery are normal in caliber.  Central airways are patent. There is a 1.9 x 1.7 cm nodule within the right lower lobe (image 34; series 3). There is an adjacent 0.6 cm right lower lobe nodule (image 34; series 3). There is a 0.5 cm right upper lobe nodule (image 21; series 3). There is a 3 mm right upper lobe nodule (image 15; series 3). Dependent ground-glass and consolidative opacity demonstrated within the right and left lower lobes. Additionally there is left lower lobe bronchial wall thickening. No pleural effusion or pneumothorax.  Visualization  of the upper abdomen demonstrates a centrally necrotic enhancing mass within the hepatic dome measuring 2.8 x 2.9 cm. There is an additional 0.7 cm low-attenuation lesion within the hepatic dome (image 44; series 2). There is an additional 1.9 x 1.9 cm peripherally enhancing centrally necrotic lesion within the hepatic dome (image 40; series 2). Re- demonstrated hepatic parenchymal calcifications.  Mild heterogeneity along the inferior margin of right hepatic lobe. This is incompletely evaluated and focal lesion at this location is not excluded. Small amount of perihepatic fluid. Small hiatal hernia. Diffuse esophageal wall thickening, most pronounced near the junction.  There is dilatation of the main pancreatic duct measuring up to 11 mm.  Multiple old posterior rib fractures. No aggressive or acute appearing osseous lesions. Degenerative changes of the lower thoracic and lumbar vertebral bodies. Gynecomastia.  IMPRESSION: 1. Multiple pulmonary nodules, concerning for metastatic disease. 2. Multiple centrally necrotic peripherally enhancing hepatic masses, concerning for metastatic disease. 3. Marked dilatation of the main pancreatic duct measuring up to 11 mm. The causative etiology is not identified on current evaluation as the entire pancreas is not included. Recommend dedicated  evaluation with contrast-enhanced CT or potentially MRI. 4. Bilateral lower cervical adenopathy, most compatible with metastatic disease. See dedicated neck CT report. 5. Bilateral lower lobe ground-glass opacities, favored to represent atelectasis. Additionally there is left lower lobe bronchial wall thickening, raising the possibility of associated aspiration. 6. Diffuse wall thickening of the esophagus, most pronounced near the GE junction. While findings may be secondary to esophagitis, underlying distal esophageal mass is not entirely excluded. Consider correlation with EGD.  These results will be called to the ordering clinician or representative by the Radiologist Assistant, and communication documented in the PACS or zVision Dashboard.   Electronically Signed   By: Lovey Newcomer M.D.   On: 03/03/2014 13:20   US Soft Tissue Head/neck  03/04/2014   CLINICAL DATA:  Neck mass, suspected cervical adenopathy by neck CT  EXAM: ULTRASOUND OF HEAD/NECK SOFT TISSUES  TECHNIQUE: Ultrasound examination of the head and neck soft tissues was performed in the area of clinical concern.  COMPARISON:  None  FINDINGS: In the RIGHT cervical region, 3 soft tissue masses are identified likely representing enlarged lymph nodes.  More superior nodule is heterogeneous with areas of hyper echogenicity to hypoechoic question complex cystic change, 4.1 x 2.6 x 2.8 cm, containing internal blood flow on color Doppler imaging.  A more in inferior nodule measures 4.2 x 2.4 x 2.7 cm, is predominantly iso- to hypoechoic with mild heterogeneity, with more pronounced internal blood flow.  A smaller inferior RIGHT side nodule is identified measuring 1.5 x 1.1 x 1.0 cm, homogeneous relatively isoechoic.  In addition, an enlarged LEFT cervical node is identified, 2.3 x 3.6 x 1.8 cm, heterogeneous in echogenicity and containing internal blood flow.  The 3 largest nodules contain tiny echogenic foci which likely represent tiny calcifications.   IMPRESSION: Multiple soft tissue nodules within the neck as above most likely representing enlarged abnormal appearing cervical lymph nodes.   Electronically Signed   By: Lavonia Dana M.D.   On: 03/04/2014 14:19   US Guided Needle Placement  03/04/2014   CLINICAL DATA:  Cervical adenopathy  EXAM: ULTRASOUND GUIDED CORE NEEDLE BIOPSY OF A RIGHT CERVICAL NODE  COMPARISON:  Previous exams.  FINDINGS: Procedure, risks, benefits and alternatives discussed with the patient.  Patient's questions answered.  Written informed consent for core biopsy of the enlarged RIGHT cervical lymph nodes was obtained.  Time-out  protocol followed.  Dominant nodule in the inferior RIGHT cervical region localized by ultrasound  Nodule measures 4.2 x 2.4 x 2.7 cm.  Skin prepped and draped in usual sterile fashion.  Skin and overlying soft tissues anesthetized with 3 mL of 2% lidocaine lidocaine.  Under direct sonographic visualization, three 14 gauge core biopsies of the targeted enlarged RIGHT cervical lymph node were performed.  Ultrasound visualization was utilized to confirm core sample acquisition within the targeted lymph node.  Obtained tissue cores appeared adequate and were placed in the appropriate container.  Procedure tolerated well by patient.  Specimen sent to pathology for evaluation.  No evidence of hematoma on post procedural imaging.  IMPRESSION: Three ultrasound guided core biopsies of an enlarged RIGHT cervical lymph node as above.  No acute abnormalities.   Electronically Signed   By: Lavonia Dana M.D.   On: 03/04/2014 15:53   [place] Core Biopsy  03/04/2014   CLINICAL DATA:  Cervical adenopathy  EXAM: ULTRASOUND GUIDED CORE NEEDLE BIOPSY OF A RIGHT CERVICAL NODE  COMPARISON:  Previous exams.  FINDINGS: Procedure, risks, benefits and alternatives discussed with the patient.  Patient's questions answered.  Written informed consent for core biopsy of the enlarged RIGHT cervical lymph nodes was obtained.  Time-out protocol  followed.  Dominant nodule in the inferior RIGHT cervical region localized by ultrasound  Nodule measures 4.2 x 2.4 x 2.7 cm.  Skin prepped and draped in usual sterile fashion.  Skin and overlying soft tissues anesthetized with 3 mL of 2% lidocaine lidocaine.  Under direct sonographic visualization, three 14 gauge core biopsies of the targeted enlarged RIGHT cervical lymph node were performed.  Ultrasound visualization was utilized to confirm core sample acquisition within the targeted lymph node.  Obtained tissue cores appeared adequate and were placed in the appropriate container.  Procedure tolerated well by patient.  Specimen sent to pathology for evaluation.  No evidence of hematoma on post procedural imaging.  IMPRESSION: Three ultrasound guided core biopsies of an enlarged RIGHT cervical lymph node as above.  No acute abnormalities.   Electronically Signed   By: Lavonia Dana M.D.   On: 03/04/2014 15:53   US Abdomen Limited Ruq  03/03/2014   CLINICAL DATA:  Cirrhosis, diabetes, hypercholesterolemia, abnormal CT exam in 2011 showing a questionable hepatic abnormality  EXAM: US ABDOMEN LIMITED - RIGHT UPPER QUADRANT  COMPARISON:  CT chest 01/05/2010, CT abdomen 08/19/2009  FINDINGS: Gallbladder:  Thickened gallbladder wall. Shadowing calculi and sludge within gallbladder. Gallbladder incompletely distended. No sonographic Murphy sign. Mild amount of pericholecystic fluid.  Common bile duct:  Diameter: Normal caliber 4 mm diameter  Liver:  Increased hepatic echogenicity with nodular margins consistent with cirrhosis. Hepatopetal portal venous flow. Question nodule within RIGHT lobe 3.0 x 1.9 x 2.6 cm. Linear calcification within RIGHT lobe on prior CT exam again identified.  Mild RIGHT renal collecting system dilatation noted.  Mildly heterogeneous appearance of the pancreas is identified, atrophic, poorly assessed due to overlying bowel.  IMPRESSION: Thickened gallbladder wall nonspecific in the setting of  minimal ascites.  Gallstones and sludge within gallbladder without definite sonographic Murphy sign.  If there is persistent clinical concern for acute cholecystitis recommend hepatobiliary imaging.  Cirrhotic appearing liver with linear calcification in RIGHT lobe again identified.  Nonspecific RIGHT lobe liver lesion 3.0 x 1.9 x 2.6 cm, uncertain etiology, not definitely seen in prior exam; recommend followup MR assessment with and without contrast to characterize.   Electronically Signed   By: Lavonia Dana  M.D.   On: 03/03/2014 14:35    Scheduled Meds: . sodium chloride  250 mL Intravenous Once  . feeding supplement (GLUCERNA SHAKE)  237 mL Oral TID BM  . folic acid  1 mg Oral Daily  . insulin aspart  0-9 Units Subcutaneous TID WC  . insulin aspart  3 Units Subcutaneous TID WC  . levalbuterol  0.63 mg Nebulization TID  . levofloxacin (LEVAQUIN) IV  750 mg Intravenous Q24H  . lisinopril  5 mg Oral Daily  . multivitamin with minerals  1 tablet Oral Daily  . pantoprazole  40 mg Oral BID AC  . promethazine  12.5 mg Intravenous On Call  . thiamine  100 mg Oral Daily   Or  . thiamine  100 mg Intravenous Daily  . vancomycin  750 mg Intravenous Q12H   Continuous Infusions: . 0.9 % NaCl with KCl 20 mEq / L 50 mL/hr at 03/04/14 1043    Principal Problem:   CAP (community acquired pneumonia) Active Problems:   Seizure disorder   HTN (hypertension)   Diabetes type 2, uncontrolled   Hyperlipidemia   Dysphagia   Hypokalemia   Hypoglycemia associated with diabetes   Cervical lymphadenopathy   Tobacco abuse   Anemia   Cirrhosis   Hypomagnesemia   Neck mass   Liver mass   Metastatic disease   Severe malnutrition   Bacteremia    Time spent: 35 minutes    [facility] Pager [phone]. If 7PM-7AM, please contact night-coverage at [email or web address], password [facility] 03/05/2014, 9:47 AM  LOS: 3 days

## 2014-03-05 NOTE — Op Note (Signed)
[facility] [address] [place], [address]   ENDOSCOPY PROCEDURE REPORT  PATIENT: [name], [name]  MR#: [id number] BIRTHDATE: 1950/04/13 , 14  yrs. old GENDER: male ENDOSCOPIST: R.  Garfield Cornea, MD FACP [facility] REFERRED BY:     hospitalist PROCEDURE DATE:  2014-04-04 PROCEDURE:  EGD w/ biopsy INDICATIONS:  Marland KitchenMarland KitchenDysphagia; abnormal esophagus on cross-sectional imaging. Apparent metastatic disease. MEDICATIONS: Versed 2 mg IV and Demerol 25 mg IV.  Zofran 4 mg IV. Xylocaine gel orally. ASA CLASS:      Class III  CONSENT: The risks, benefits, limitations, alternatives and imponderables have been discussed.  The potential for biopsy, esophogeal dilation, etc. have also been reviewed.  Questions have been answered.  All parties agreeable.  Please see the history and physical in the medical record for more information.  DESCRIPTION OF PROCEDURE: After the risks benefits and alternatives of the procedure were thoroughly explained, informed consent was obtained.  The EG-[id number] ([id number]) endoscope was introduced through the mouth and advanced to the second portion of the duodenum , limited by Without limitations. The instrument was slowly withdrawn as the mucosa was fully examined.    3 columns of grade 2 esophageal varices without bleeding stigmata. Superimposed on varices were "geographic" erosions and ulcerations of the esophageal mucosa coming halfway up the tubular esophagus. No tumor.  No Barrett's esophagus.  The esophagus remained patent throughout its course.  Stomach empty.  Small hiatal hernia. Diffuse mottling of the gastric mucosa with some adenomatous change in the antrum.  No discrete ulcer or infiltrating process observed. Patent pylorus.  Normal first and second portion of the duodenum. The abnormal gastric mucosa was biopsied for histologic study.  No esophageal dilation indicated.  Abnormal gastric mucosa was biopsied..  Retroflexed views  revealed as previously described.     The scope was then withdrawn from the patient and the procedure completed.  COMPLICATIONS: There were no immediate complications.  ENDOSCOPIC IMPRESSION: Ulcerative/erosive reflux esophagitis?"extensive superimposed on nonbleeding esophageal varices grade 2. Hiatal hernia. Abnormal gastric mucosa?"status post gastric biopsy . Patent esophagus.  RECOMMENDATIONS: Continue Protonix 40 mg twice daily. Add Carafate suspension 4 times a day. Agree with proceeding with abdominal CAT scan. Follow up on pathology.  REPEAT EXAM:  eSigned:  R. Garfield Cornea, MD [facility] 2014-04-04 2:37 PM    CC:  CPT CODES: ICD CODES:  The ICD and CPT codes recommended by this software are interpretations from the data that the clinical staff has captured with the software.  The verification of the translation of this report to the ICD and CPT codes and modifiers is the sole responsibility of the health care institution and practicing physician where this report was generated.  [employer] will not be held responsible for the validity of the ICD and CPT codes included on this report.  AMA assumes no liability for data contained or not contained herein. CPT is a Designer, television/film set of the [employer].  PATIENT NAME:  Jim, Kirby MR#: [id number]

## 2014-03-05 NOTE — Progress Notes (Addendum)
Inpatient Diabetes Program Recommendations  AACE/ADA: New Consensus Statement on Inpatient Glycemic Control (2013)  Target Ranges:  Prepandial:   less than 140 mg/dL      Peak postprandial:   less than 180 mg/dL (1-2 hours)      Critically ill patients:  140 - 180 mg/dL   Results for [name], TUGWELL (MRN [id number]) as of 03/05/2014 13:09  Ref. Range 03/04/2014 08:16 03/04/2014 11:19 03/04/2014 16:51 03/04/2014 21:53 03/05/2014 08:09 03/05/2014 11:17  Glucose-Capillary Latest Range: 70-99 mg/dL 156 (H) 209 (H) 347 (H) 119 (H) 216 (H) 237 (H)   Diabetes history: DM Outpatient Diabetes medications: Glipizide 10 mg BID, Levemir 50 units QHS, Novolog 16 units QAM (Noted patient reports that he has not been taking insulin. He is only taking oral DM medication) Current orders for Inpatient glycemic control: Novolog 0-9 units AC, Novolog 3 units TID with meals  Inpatient Diabetes Program Recommendations Insulin - Basal: Please consider ordering Levemir 5 units QHS (based on 52 kg x 0.1 units). Correction (SSI): Please consider increasing Novolog correction to moderate scale and change to Q4H if patient will be kept NPO.  Note: In reviewing the chart, noted Novolog correction was not given this morning at 8am for CBG of 216 with charted reason as "NPO". Patient should have received Novolog correction even if NPO as Novolog correction is intended to correct the glucose and get it back in target range. Meal coverage was not given this morning since patient was NPO which is appropriate. NURSING: Please administer Novolog correction insulin as ordered even if patient is NPO.  Thanks, Barnie Alderman, RN, MSN, CCRN, CDE Diabetes Coordinator Inpatient Diabetes Program [phone] (Team Pager) [phone] ([facility] office) [phone] [facility] office)

## 2014-03-05 NOTE — Progress Notes (Signed)
SLP Cancellation Note  Patient Details Name: [name] MRN: [id number] DOB: 10-28-1950   Cancelled treatment:       Reason Eval/Treat Not Completed: Patient at procedure or test/unavailable; Consult received for BSE, however pt currently NPO pending EGD this afternoon so my exam cannot be completed at this time. [name], RN reports that pt has been choking on full liquid diet. Chart reviewed- pt febrile over night. He will likely need objective study via MBSS. Will call [name], as SLP scheduled to come in tomorrow cannot do MBSS. I can come in to see him for MBSS if necessary tomorrow AM at 9 AM.   Thank you,  Genene Churn, [place]    [place] 03/05/2014, 12:53 PM

## 2014-03-05 NOTE — Progress Notes (Signed)
UR chart review completed.  

## 2014-03-05 NOTE — Progress Notes (Signed)
Patients blood transfusion only half given at 4 hour mark. Blood stopped as per protocol. [name] NP and Kirby Crigler PA notified.

## 2014-03-06 ENCOUNTER — Encounter (HOSPITAL_COMMUNITY): Payer: Self-pay | Admitting: Internal Medicine

## 2014-03-06 ENCOUNTER — Inpatient Hospital Stay (HOSPITAL_COMMUNITY): Payer: Medicare Other

## 2014-03-06 DIAGNOSIS — K6389 Other specified diseases of intestine: Secondary | ICD-10-CM | POA: Diagnosis present

## 2014-03-06 DIAGNOSIS — K8020 Calculus of gallbladder without cholecystitis without obstruction: Secondary | ICD-10-CM | POA: Diagnosis present

## 2014-03-06 DIAGNOSIS — N1330 Unspecified hydronephrosis: Secondary | ICD-10-CM | POA: Diagnosis present

## 2014-03-06 DIAGNOSIS — I8500 Esophageal varices without bleeding: Secondary | ICD-10-CM | POA: Diagnosis present

## 2014-03-06 DIAGNOSIS — K2210 Ulcer of esophagus without bleeding: Secondary | ICD-10-CM | POA: Diagnosis present

## 2014-03-06 LAB — CLOSTRIDIUM DIFFICILE BY PCR: CDIFFPCR: NEGATIVE

## 2014-03-06 LAB — TYPE AND SCREEN
ABO/RH(D): B POS
ANTIBODY SCREEN: NEGATIVE
UNIT DIVISION: 0
Unit division: 0

## 2014-03-06 LAB — CBC
HCT: 33 % — ABNORMAL LOW (ref 39.0–52.0)
Hemoglobin: 11 g/dL — ABNORMAL LOW (ref 13.0–17.0)
MCH: 23.9 pg — AB (ref 26.0–34.0)
MCHC: 33.3 g/dL (ref 30.0–36.0)
MCV: 71.6 fL — ABNORMAL LOW (ref 78.0–100.0)
Platelets: 335 10*3/uL (ref 150–400)
RBC: 4.61 MIL/uL (ref 4.22–5.81)
RDW: 17.5 % — ABNORMAL HIGH (ref 11.5–15.5)
WBC: 17.5 10*3/uL — ABNORMAL HIGH (ref 4.0–10.5)

## 2014-03-06 LAB — GLUCOSE, CAPILLARY
GLUCOSE-CAPILLARY: 92 mg/dL (ref 70–99)
Glucose-Capillary: 212 mg/dL — ABNORMAL HIGH (ref 70–99)
Glucose-Capillary: 153 mg/dL — ABNORMAL HIGH (ref 70–99)
Glucose-Capillary: 272 mg/dL — ABNORMAL HIGH (ref 70–99)

## 2014-03-06 LAB — BASIC METABOLIC PANEL
ANION GAP: 10 (ref 5–15)
BUN: 5 mg/dL — ABNORMAL LOW (ref 6–23)
CHLORIDE: 97 meq/L (ref 96–112)
CO2: 27 mEq/L (ref 19–32)
Calcium: 8.5 mg/dL (ref 8.4–10.5)
Creatinine, Ser: 0.7 mg/dL (ref 0.50–1.35)
GFR calc non Af Amer: >90 mL/min (ref >90)
Glucose, Bld: 135 mg/dL — ABNORMAL HIGH (ref 70–99)
POTASSIUM: 4.2 meq/L (ref 3.7–5.3)
Sodium: 134 mEq/L — ABNORMAL LOW (ref 137–147)

## 2014-03-06 LAB — OCCULT BLOOD X 1 CARD TO LAB, STOOL: Fecal Occult Bld: NEGATIVE

## 2014-03-06 MED ORDER — INSULIN ASPART 100 UNIT/ML ~~LOC~~ SOLN
5.0000 [IU] | Freq: Three times a day (TID) | SUBCUTANEOUS | Status: DC
  Administered 2014-03-06 – 2014-03-08 (×5): 5 [IU] via SUBCUTANEOUS

## 2014-03-06 NOTE — Progress Notes (Signed)
Subjective:  Patient without complaints today.   Objective: Vital signs in last 24 hours: Temp:  [97.7 F (36.5 C)-99 F (37.2 C)] 99 F (37.2 C) (12/18 0646) Pulse Rate:  [90-109] 93 (12/18 0646) Resp:  [15-24] 20 (12/18 0646) BP: (95-139)/(57-102) 114/72 mmHg (12/18 0646) SpO2:  [95 %-100 %] 95 % (12/18 0741) Last BM Date: 03/05/14 General:   Alert,  Well-developed, well-nourished, pleasant and cooperative in NAD Head:  Normocephalic and atraumatic. Eyes:  Sclera clear, no icterus.   Abdomen:  Soft, nontender and nondistended.   Normal bowel sounds, without guarding, and without rebound.   Extremities:  Without clubbing, deformity or edema. Neurologic:  Alert and  oriented x4;  grossly normal neurologically. Skin:  Intact without significant lesions or rashes. Psych:  Alert and cooperative. Normal mood and affect.  Intake/Output from previous day: 12/17 0701 - 12/18 0700 In: 1610 [P.O.:240; I.V.:1085; IV Piggyback:50] Out: 9604 [Urine:1000; Stool:2] Intake/Output this shift:    Lab Results: CBC  Recent Labs  03/04/14 0609 03/05/14 1057 03/06/14 0610  WBC 10.4 14.9* 17.5*  HGB 7.9* 12.0* 11.0*  HCT 24.9* 36.9* 33.0*  MCV 69.2* 72.8* 71.6*  PLT 355 324 335   BMET  Recent Labs  03/04/14 0609 03/05/14 1057 03/06/14 0610  NA 135* 135* 134*  K 4.2 5.1 4.2  CL 97 97 97  CO2 29 29 27   GLUCOSE 172* 246* 135*  BUN 3* 6 5*  CREATININE 0.67 0.72 0.70  CALCIUM 8.2* 8.7 8.5   LFTs No results for input(s): BILITOT, BILIDIR, IBILI, ALKPHOS, AST, ALT, PROT, ALBUMIN in the last 72 hours. No results for input(s): LIPASE in the last 72 hours. PT/INR No results for input(s): LABPROT, INR in the last 72 hours.    Imaging Studies: Dg Chest 2 View  03/02/2014   CLINICAL DATA:  Choking on food denied  EXAM: CHEST  2 VIEW  COMPARISON:  02/28/2014  FINDINGS: Right hemidiaphragm remains elevated. Nodular density now projects over the inferior right hilum. Hazy airspace  disease at the right base. Left lung is clear. Chronic right-sided rib deformities.  IMPRESSION: Hazy right lower lobe airspace disease.  New nodular density at the right lung base is indeterminate. Followup studies to ensure resolution are recommended. If the abnormality fails to resolve, CT is recommended.   Electronically Signed   By: Maryclare Bean M.D.   On: 03/02/2014 20:03   Dg Chest 2 View  02/28/2014   CLINICAL DATA:  Cough and congestion for several days.  EXAM: CHEST  2 VIEW  COMPARISON:  05/23/2010  FINDINGS: Mild lung base opacity is noted, greater on the right. This may all be atelectasis. Pneumonia is possible but felt less likely. Elevation the right hemidiaphragm is stable.  No evidence of pulmonary edema. No pleural effusion or pneumothorax.  Cardiac silhouette is normal in size. Normal mediastinal and hilar contours.  Bony thorax is demineralized but intact.  IMPRESSION: 1. Lung base opacity, right greater than left, likely atelectasis. Right lower lobe pneumonia is possible, but felt less likely. 2. Chronic mild elevation the right hemidiaphragm. No pulmonary edema.   Electronically Signed   By: Lajean Manes M.D.   On: 02/28/2014 14:03   Ct Head Wo Contrast  02/15/2014   CLINICAL DATA:  63 year old male with headache.  EXAM: CT HEAD WITHOUT CONTRAST  TECHNIQUE: Contiguous axial images were obtained from the base of the skull through the vertex without intravenous contrast.  COMPARISON:  08/21/2008 and 04/29/2008 head CTs  FINDINGS:  Generalized cerebral and cerebellar atrophy again noted.  Mild chronic small-vessel white matter ischemic changes again identified.  Bifrontal encephalomalacia is again noted.  No acute intracranial abnormalities are identified, including mass lesion or mass effect, hydrocephalus, extra-axial fluid collection, midline shift, hemorrhage, or acute infarction.  The visualized bony calvarium is unremarkable.  IMPRESSION: No evidence of acute intracranial abnormality.   Atrophy, chronic small-vessel white matter ischemic changes and bifrontal encephalomalacia.   Electronically Signed   By: Hassan Rowan M.D.   On: 02/15/2014 21:43   Ct Soft Tissue Neck W Contrast  03/03/2014   CLINICAL DATA:  63 year old male with cough, shortness of breath, abnormal neck fullness. Dysphagia for 4-5 months.  EXAM: CT NECK WITH CONTRAST  TECHNIQUE: Multidetector CT imaging of the neck was performed using the standard protocol following the bolus administration of intravenous contrast.  CONTRAST:  67mL OMNIPAQUE IOHEXOL 300 MG/ML SOLN in conjunction with contrast enhanced imaging of the chest reported separately.  COMPARISON:  Noncontrast CTs of the head 02/15/2014 and earlier.  FINDINGS: Chest findings today reported separately.  Study is intermittently degraded by motion artifact despite repeated imaging attempts.  Bulky and confluent cystic/necrotic soft tissue mass is in the right neck. A conglomeration at the right level 2 nodal station measures up to 45 mm largest dimension. More solid enhancing nodal/ex nodal disease tracking to the right level 4 station. At the level of the lateral wall of the oropharynx there is asymmetric but indistinct soft tissue thickening of the pharynx up to 9 mm (series 8, image 43). Some of the abnormal cystic/necrotic soft tissue mass extends to abut the enter surface of the ramus of the right mandible (series 8, image 44). And there is motion artifact in the region of the mandible. There are prior ORIF changes to the right mandible ramus. Dentition is absent throughout. There is sclerosis of the posterior body of the right mandible, but no bony destruction or erosion identified.  There are smaller cystic/necrotic masses in the left head and neck including at the left level 1B nodal station (series 8, image 54). There is conglomerate involvement at the left Level 3 station. The left level 2 station is spared. There is a mildly enlarged solid-appearing node at the  level 1 A station, 10 mm short axis. The largest of these masses measure up to 3.9 cm largest dimension.  Negative thyroid. Larynx is within normal limits. Negative retropharyngeal space. Negative sublingual space. Negative submandibular and parotid glands except for regional mass effect and right submandibular ductal ectasia. Visualized orbit soft tissues are within normal limits. Grossly negative visible brain parenchyma.  Chronic inflammation of the left maxillary sinus, left middle ear and mastoid, with mucoperiosteal thickening in those regions. There may be a small accessory tooth in the anterior left maxillary sinus (series 17, image 28).  Both carotid and vertebral arteries in the neck remain patent. The right IJ is in dated and thrombosed. The bulky right neck disease is inseparable from the right carotid bifurcation, and tracks medially through the splayed right external and internal carotid arteries. The left IJ is compressed but remains patent.  No acute or suspicious osseous lesion identified. Degenerative changes in the cervical spine.  IMPRESSION: 1. 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 This invades and occludes the right internal jugular vein, and encases the right carotid bifurcation. 2. Left level 1 and level 3 nodal station  involvement as well. 3. Primary tumor site is uncertain, but might be the right tonsillar pillar. 4. Chest CT findings today reported separately.   Electronically Signed   By: Lars Pinks M.D.   On: 03/03/2014 12:55   Ct Chest W Contrast  03/03/2014   CLINICAL DATA:  Cough, shortness of breath. Neck fullness. Dysphagia.  EXAM: CT CHEST WITH CONTRAST  TECHNIQUE: Multidetector CT imaging of the chest was performed during intravenous contrast administration.  CONTRAST:  30mL OMNIPAQUE IOHEXOL 300 MG/ML  SOLN  COMPARISON:  Chest CT 01/05/2010  FINDINGS: Neck findings  reported separately.  Visualized thyroid is unremarkable. No axillary or mediastinal lymphadenopathy. Bilateral lower neck adenopathy, see dedicated neck CT report for description. Normal heart size. No pericardial effusion. Aorta and main pulmonary artery are normal in caliber.  Central airways are patent. There is a 1.9 x 1.7 cm nodule within the right lower lobe (image 34; series 3). There is an adjacent 0.6 cm right lower lobe nodule (image 34; series 3). There is a 0.5 cm right upper lobe nodule (image 21; series 3). There is a 3 mm right upper lobe nodule (image 15; series 3). Dependent ground-glass and consolidative opacity demonstrated within the right and left lower lobes. Additionally there is left lower lobe bronchial wall thickening. No pleural effusion or pneumothorax.  Visualization of the upper abdomen demonstrates a centrally necrotic enhancing mass within the hepatic dome measuring 2.8 x 2.9 cm. There is an additional 0.7 cm low-attenuation lesion within the hepatic dome (image 44; series 2). There is an additional 1.9 x 1.9 cm peripherally enhancing centrally necrotic lesion within the hepatic dome (image 40; series 2). Re- demonstrated hepatic parenchymal calcifications.  Mild heterogeneity along the inferior margin of right hepatic lobe. This is incompletely evaluated and focal lesion at this location is not excluded. Small amount of perihepatic fluid. Small hiatal hernia. Diffuse esophageal wall thickening, most pronounced near the junction.  There is dilatation of the main pancreatic duct measuring up to 11 mm.  Multiple old posterior rib fractures. No aggressive or acute appearing osseous lesions. Degenerative changes of the lower thoracic and lumbar vertebral bodies. Gynecomastia.  IMPRESSION: 1. Multiple pulmonary nodules, concerning for metastatic disease. 2. Multiple centrally necrotic peripherally enhancing hepatic masses, concerning for metastatic disease. 3. Marked dilatation of the  main pancreatic duct measuring up to 11 mm. The causative etiology is not identified on current evaluation as the entire pancreas is not included. Recommend dedicated evaluation with contrast-enhanced CT or potentially MRI. 4. Bilateral lower cervical adenopathy, most compatible with metastatic disease. See dedicated neck CT report. 5. Bilateral lower lobe ground-glass opacities, favored to represent atelectasis. Additionally there is left lower lobe bronchial wall thickening, raising the possibility of associated aspiration. 6. Diffuse wall thickening of the esophagus, most pronounced near the GE junction. While findings may be secondary to esophagitis, underlying distal esophageal mass is not entirely excluded. Consider correlation with EGD.  These results will be called to the ordering clinician or representative by the Radiologist Assistant, and communication documented in the PACS or zVision Dashboard.   Electronically Signed   By: Lovey Newcomer M.D.   On: 03/03/2014 13:20   US Soft Tissue Head/neck  03/04/2014   CLINICAL DATA:  Neck mass, suspected cervical adenopathy by neck CT  EXAM: ULTRASOUND OF HEAD/NECK SOFT TISSUES  TECHNIQUE: Ultrasound examination of the head and neck soft tissues was performed in the area of clinical concern.  COMPARISON:  None  FINDINGS: In the RIGHT cervical  region, 3 soft tissue masses are identified likely representing enlarged lymph nodes.  More superior nodule is heterogeneous with areas of hyper echogenicity to hypoechoic question complex cystic change, 4.1 x 2.6 x 2.8 cm, containing internal blood flow on color Doppler imaging.  A more in inferior nodule measures 4.2 x 2.4 x 2.7 cm, is predominantly iso- to hypoechoic with mild heterogeneity, with more pronounced internal blood flow.  A smaller inferior RIGHT side nodule is identified measuring 1.5 x 1.1 x 1.0 cm, homogeneous relatively isoechoic.  In addition, an enlarged LEFT cervical node is identified, 2.3 x 3.6 x 1.8  cm, heterogeneous in echogenicity and containing internal blood flow.  The 3 largest nodules contain tiny echogenic foci which likely represent tiny calcifications.  IMPRESSION: Multiple soft tissue nodules within the neck as above most likely representing enlarged abnormal appearing cervical lymph nodes.   Electronically Signed   By: Lavonia Dana M.D.   On: 03/04/2014 14:19   US Guided Needle Placement  03/04/2014   CLINICAL DATA:  Cervical adenopathy  EXAM: ULTRASOUND GUIDED CORE NEEDLE BIOPSY OF A RIGHT CERVICAL NODE  COMPARISON:  Previous exams.  FINDINGS: Procedure, risks, benefits and alternatives discussed with the patient.  Patient's questions answered.  Written informed consent for core biopsy of the enlarged RIGHT cervical lymph nodes was obtained.  Time-out protocol followed.  Dominant nodule in the inferior RIGHT cervical region localized by ultrasound  Nodule measures 4.2 x 2.4 x 2.7 cm.  Skin prepped and draped in usual sterile fashion.  Skin and overlying soft tissues anesthetized with 3 mL of 2% lidocaine lidocaine.  Under direct sonographic visualization, three 14 gauge core biopsies of the targeted enlarged RIGHT cervical lymph node were performed.  Ultrasound visualization was utilized to confirm core sample acquisition within the targeted lymph node.  Obtained tissue cores appeared adequate and were placed in the appropriate container.  Procedure tolerated well by patient.  Specimen sent to pathology for evaluation.  No evidence of hematoma on post procedural imaging.  IMPRESSION: Three ultrasound guided core biopsies of an enlarged RIGHT cervical lymph node as above.  No acute abnormalities.   Electronically Signed   By: Lavonia Dana M.D.   On: 03/04/2014 15:53   Ct Abdomen Pelvis W Contrast  03/05/2014   CLINICAL DATA:  Malignancy.  EXAM: CT ABDOMEN AND PELVIS WITH CONTRAST  TECHNIQUE: Multidetector CT imaging of the abdomen and pelvis was performed using the standard protocol following  bolus administration of intravenous contrast.  CONTRAST:  50mL OMNIPAQUE IOHEXOL 300 MG/ML SOLN, 167mL OMNIPAQUE IOHEXOL 300 MG/ML SOLN  COMPARISON:  CT scan of August 19, 2009.  FINDINGS: 23 x 16 mm irregular density is noted posteriorly in the right lower lobe concerning for metastatic disease. Smaller nodule is noted more anteriorly in the right lower lobe. Old right rib fractures are noted. No other significant osseous abnormality seen in the visualized portion of the abdomen or pelvis.  Hepatic cirrhosis is again noted with calcification present within right hepatic cleft. Mild cholelithiasis is noted. The spleen appears normal. Pancreatic head calcifications are noted suggesting chronic pancreatitis. There is interval development of severe pancreatic ductal dilatation suggesting obstruction. No pancreatic mass is noted. No biliary dilatation is noted. 2.1 cm ill-defined low density is noted in the medial portion the right hepatic lobe concerning for metastatic disease or malignancy. Adrenal glands appear normal. Nonobstructive calculus is noted in lower pole collecting system of left kidney. Bilateral hydronephrosis is noted without evidence of ureteral calculus. Mild  urinary bladder distention is noted.  Large irregular mass measuring 7.1 x 4.9 cm is seen involving the cecum consistent with colonic malignancy. Multiple enlarged satellite masses or lesions are noted concerning for metastatic disease in the right lower quadrant mesenteric region. The largest measures 3.3 x 2.6 cm in size. There is no evidence of bowel obstruction. Stool is noted in the rectum. No abnormal fluid collection is noted.  Atherosclerotic calcifications of abdominal aorta are noted without aneurysm formation.  Very large right inguinal hernia is noted which contains loops of small bowel, but does not result in incarceration or obstruction. This extends into the scrotum. Is significantly increased in size compared to prior exam.   IMPRESSION: Large right inguinal hernia is noted which is significantly increased in size compared to prior exam, which extends into the scrotum. And contains loops of small bowel, but does not result in incarceration or obstruction.  Hepatic cirrhosis is noted. 2.1 cm ill-defined low density is noted in the medial portion of the right hepatic lobe consistent with metastatic disease or malignancy.  Mild cholelithiasis.  2.3 cm irregular density seen posteriorly in the right lower lobe concerning for metastatic disease.  Large irregular mass is seen involving the cecum consistent with colonic malignancy. Multiple and large satellite lesions are masses are noted in the right lower quadrant mesenteric region consistent with metastatic disease.  Nonobstructive left nephrolithiasis is noted.  Bilateral hydronephrosis is noted without evidence of obstructing calculus. This is concerning for distal ureteral obstruction of unknown etiology, or potentially may be due to urinary bladder distention.  Pancreatic head calcifications are noted suggesting chronic pancreatitis. There is interval development of severe dilatation of the pancreatic duct without common bile duct dilatation. This is concerning for ductal obstruction.   Electronically Signed   By: Sabino Dick M.D.   On: 03/05/2014 21:40   [place] Core Biopsy  03/04/2014   CLINICAL DATA:  Cervical adenopathy  EXAM: ULTRASOUND GUIDED CORE NEEDLE BIOPSY OF A RIGHT CERVICAL NODE  COMPARISON:  Previous exams.  FINDINGS: Procedure, risks, benefits and alternatives discussed with the patient.  Patient's questions answered.  Written informed consent for core biopsy of the enlarged RIGHT cervical lymph nodes was obtained.  Time-out protocol followed.  Dominant nodule in the inferior RIGHT cervical region localized by ultrasound  Nodule measures 4.2 x 2.4 x 2.7 cm.  Skin prepped and draped in usual sterile fashion.  Skin and overlying soft tissues anesthetized with 3 mL of 2%  lidocaine lidocaine.  Under direct sonographic visualization, three 14 gauge core biopsies of the targeted enlarged RIGHT cervical lymph node were performed.  Ultrasound visualization was utilized to confirm core sample acquisition within the targeted lymph node.  Obtained tissue cores appeared adequate and were placed in the appropriate container.  Procedure tolerated well by patient.  Specimen sent to pathology for evaluation.  No evidence of hematoma on post procedural imaging.  IMPRESSION: Three ultrasound guided core biopsies of an enlarged RIGHT cervical lymph node as above.  No acute abnormalities.   Electronically Signed   By: Lavonia Dana M.D.   On: 03/04/2014 15:53   US Abdomen Limited Ruq  03/03/2014   CLINICAL DATA:  Cirrhosis, diabetes, hypercholesterolemia, abnormal CT exam in 2011 showing a questionable hepatic abnormality  EXAM: US ABDOMEN LIMITED - RIGHT UPPER QUADRANT  COMPARISON:  CT chest 01/05/2010, CT abdomen 08/19/2009  FINDINGS: Gallbladder:  Thickened gallbladder wall. Shadowing calculi and sludge within gallbladder. Gallbladder incompletely distended. No sonographic Murphy sign. Mild amount of  pericholecystic fluid.  Common bile duct:  Diameter: Normal caliber 4 mm diameter  Liver:  Increased hepatic echogenicity with nodular margins consistent with cirrhosis. Hepatopetal portal venous flow. Question nodule within RIGHT lobe 3.0 x 1.9 x 2.6 cm. Linear calcification within RIGHT lobe on prior CT exam again identified.  Mild RIGHT renal collecting system dilatation noted.  Mildly heterogeneous appearance of the pancreas is identified, atrophic, poorly assessed due to overlying bowel.  IMPRESSION: Thickened gallbladder wall nonspecific in the setting of minimal ascites.  Gallstones and sludge within gallbladder without definite sonographic Murphy sign.  If there is persistent clinical concern for acute cholecystitis recommend hepatobiliary imaging.  Cirrhotic appearing liver with linear  calcification in RIGHT lobe again identified.  Nonspecific RIGHT lobe liver lesion 3.0 x 1.9 x 2.6 cm, uncertain etiology, not definitely seen in prior exam; recommend followup MR assessment with and without contrast to characterize.   Electronically Signed   By: Lavonia Dana M.D.   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 Multiple imaging studies (CT neck, CT chest, US abdomen) performed since admission with metastatic disease and unknown primary. CA 19-9 elevated at 95.8, AFP tumor marker normal. Oncology consult appreciated. Neck node biopsy completed, results pending.   Anemia: multifactorial, iron-deficiency noted. No overt signs of GI bleeding. Hemoccult status unknown but has been ordered. Continue to monitor. Received 1 and 1/2 units of blood over night.  +blood culture one specimen, Staph species, ?contaminant. Currently on levaquin and vancocin.  EGD yesterday showed ulcerative/erosive reflux esophagitis superimposed on nonbleeding esophageal varices grade 2. Abnormal gastric mucosa, s/p bx.   Bedside swallowing evaluation with positive signs/symptoms of penetration and ?aspiration. MBSS recommended.  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  Plan: 1. To discuss findings with Dr. Oneida Alar.  2. Await neck node biopsy results.    LOS: 4 days   [name]  03/06/2014, 8:46 AM

## 2014-03-06 NOTE — Progress Notes (Signed)
Inpatient Diabetes Program Recommendations  AACE/ADA: New Consensus Statement on Inpatient Glycemic Control (2013)  Target Ranges:  Prepandial:   less than 140 mg/dL      Peak postprandial:   less than 180 mg/dL (1-2 hours)      Critically ill patients:  140 - 180 mg/dL   Reason for Assessment: Hyperglycemia  Results for [name], [name] (MRN [id number]) as of 03/06/2014 12:28  Ref. Range 03/05/2014 08:09 03/05/2014 11:17 03/05/2014 13:21 03/05/2014 16:38 03/05/2014 22:18 03/06/2014 07:44 03/06/2014 12:13  Glucose-Capillary Latest Range: 70-99 mg/dL 216 (H) 237 (H) 240 (H) 137 (H) 234 (H) 92 212 (H)    Note:  Started on Levemir 5 units at HS.  Receiving Novolog 3 units tid as meal coverage, Novolog sensitive correction tid, Novolog HS scale.  Request MD consider raising meal coverage to 5 units tid with meals.  Thank you.  Marguerite Jarboe S. Marcelline Mates, RN, CNS, CDE Inpatient Diabetes Program, team pager [phone]

## 2014-03-06 NOTE — Procedures (Signed)
Objective Swallowing Evaluation: Modified Barium Swallowing Study   Patient Details  Name: [name] MRN: [id number] Date of Birth: 1950/06/19  Today's Date: 03/06/2014 Time: 1130-1200 SLP Time Calculation (min) (ACUTE ONLY): 30 min  Past Medical History:  Past Medical History  Diagnosis Date  . Diabetes mellitus   . Liver disease   . Enlarged prostate   . Hernia   . Alcohol dependence   . Seizures   . Hypercholesterolemia   . Liver lesion   . Pulmonary nodules   . Cervical mass     biopsy 03/04/14  . Metastatic disease   . Anemia   . Malnutrition   . Esophageal varices   . Hydronephrosis   . Cholelithiasis    Past Surgical History:  Past Surgical History  Procedure Laterality Date  . Appendectomy     HPI:  Jim. 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 Multiple imaging studies (CT neck, CT chest, US abdomen) performed since admission with metastatic disease and unknown primary. CA 19-9 elevated at 95.8, AFP tumor marker normal. Oncology consult appreciated. Neck node biopsy completed yesterday, results pending. Also with esophageal wall thickening on imaging and EGD tentatively planned for today. Also with plans for CT A/P with contrast today. Anemia: multifactorial, iron-deficiency noted. No overt signs of GI bleeding. Hemoccult status unknown but has been ordered. Continue to monitor. Received 1 and 1/2 units of blood over night. SLP asked to clinically evaluate swallow due to reports of solid food dysphagia.     Assessment / Plan / Recommendation Clinical Impression  Dysphagia Diagnosis: Mild oral phase dysphagia;Moderate pharyngeal phase dysphagia;Severe pharyngeal phase dysphagia Clinical impression: [name] presents with mild oral phase dysphagia in setting of edentulous status with impaired mastication and weak lingual  manipulation resulting in prolonged oral transit and residue post swallow; mod/severe sensorimotor and anatomically altered pharyngeal phase dysphagia characterized by mild delay in swallow initiation, decreased tongue base retraction, decreased hyolaryngeal excursion, decreased pharyngeal pressure and decreased airway protection resulting in significant residuals in pharynx post swallow in the valleculae, pyriforms, and posterior pharyngeal wall. Residuals are more pronounced with puree and semi solid textures, however liquids are pooled in pyriforms which pt transiently aspirates (trace amounts) post swallow. Penetration occurs during the swallow (from residuals most likely) with liquids, however with cued cough, pt is able to clear. Alternating semi solids and liquids was only marginally effective in reducing pharyngeal residue. Pt did not overtly cough during trace aspiration down posterior tracheal wall, however he had wet vocal qualiity and some throat clearing. Honey-thick liquids were not assessed as it was felt that it would only increase residuals. Pt has osteophytes along the cervical and thoracic spine with a more pronounced impingement into pharyngeal space at C4-5 which is where most solid residuals are delayed/pooled. While this is obviously not an acute finding, [name] has significant pharyngeal weakness which makes it difficult to clear the residuals. The presence of suspected mass was visualized and may also have deliterious effects on pharyngeal swallow (particularly cricopharyngeal function) also contributing to significant residuals. Pt is judged to be at risk for all textures/consistencies, however a "safest diet" can be recommended to avoid alternative sources of nutrition. Recommend D1/puree and thin liquids; pt to cough after each swallow and repeat swallow throughout meals. Recommendations reviewed with pt and precautions posted. No family present. Prognosis for  improvement is poor  given mass.    Treatment Recommendation  Therapy as outlined in treatment plan below    Diet Recommendation Dysphagia 1 (Puree);Thin liquid   Liquid Administration via: Cup Medication Administration: Crushed with puree Supervision: Patient able to self feed;Intermittent supervision to cue for compensatory strategies Compensations: Slow rate;Multiple dry swallows after each bite/sip;Follow solids with liquid;Hard cough after swallow;Effortful swallow Postural Changes and/or Swallow Maneuvers: Seated upright 90 degrees;Upright 30-60 min after meal;Out of bed for meals    Other  Recommendations Recommended Consults: MBS Oral Care Recommendations: Oral care BID Other Recommendations: Clarify dietary restrictions   Follow Up Recommendations  Home health SLP    Frequency and Duration min 1 x/week  1 week   Pertinent Vitals/Pain VSS    SLP Swallow Goals   Pt will demonstrate safe and efficient consumption of least restrictive diet with use of strategies as needed.    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 Multiple imaging studies (CT neck, CT chest, US abdomen) performed since admission with metastatic disease and unknown primary. CA 19-9 elevated at 95.8, AFP tumor marker normal. Oncology consult appreciated. Neck node biopsy completed yesterday, results pending. Also with esophageal wall thickening on imaging and EGD tentatively planned for today. Also with plans for CT A/P with contrast today. Anemia: multifactorial, iron-deficiency noted. No overt signs of GI bleeding. Hemoccult status unknown but has been ordered. Continue to monitor. Received 1 and 1/2 units of blood over night. SLP asked to clinically evaluate swallow due to reports of solid food dysphagia. Type of Study: Modified Barium Swallowing  Study Reason for Referral: Objectively evaluate swallowing function Previous Swallow Assessment: 2010 Diet Prior to this Study: Thin liquids (full liquids) Temperature Spikes Noted: No Respiratory Status: Room air History of Recent Intubation: No Behavior/Cognition: Alert;Cooperative Oral Cavity - Dentition: Edentulous Oral Motor / Sensory Function: Impaired - see Bedside swallow eval Self-Feeding Abilities: Able to feed self Patient Positioning: Upright in chair Baseline Vocal Quality: Wet Volitional Cough: Congested;Weak Volitional Swallow: Able to elicit Anatomy: Other (Comment) (osteophytes; mass-like opaque shape noted at level of ~C6) Pharyngeal Secretions: Not observed secondary MBS    Reason for Referral Objectively evaluate swallowing function   Oral Phase Oral Preparation/Oral Phase Oral Phase: Impaired Oral - Solids Oral - Mechanical Soft: Impaired mastication;Weak lingual manipulation;Reduced posterior propulsion;Delayed oral transit;Lingual/palatal residue   Pharyngeal Phase Pharyngeal Phase Pharyngeal Phase: Impaired Pharyngeal - Nectar Pharyngeal - Nectar Cup: Premature spillage to pyriform sinuses;Reduced pharyngeal peristalsis;Reduced epiglottic inversion;Reduced anterior laryngeal mobility;Reduced laryngeal elevation;Reduced airway/laryngeal closure;Reduced tongue base retraction;Penetration/Aspiration after swallow;Trace aspiration;Pharyngeal residue - valleculae;Pharyngeal residue - pyriform sinuses;Pharyngeal residue - cp segment;Lateral channel residue Penetration/Aspiration details (nectar cup): Material enters airway, passes BELOW cords without attempt by patient to eject out (silent aspiration);Material enters airway, passes BELOW cords then ejected out Pharyngeal - Thin Pharyngeal - Thin Cup: Premature spillage to pyriform sinuses;Premature spillage to valleculae;Reduced pharyngeal peristalsis;Reduced epiglottic inversion;Reduced anterior laryngeal  mobility;Reduced laryngeal elevation;Reduced airway/laryngeal closure;Reduced tongue base retraction;Penetration/Aspiration after swallow;Trace aspiration;Pharyngeal residue - valleculae;Pharyngeal residue - pyriform sinuses;Pharyngeal residue - cp segment;Inter-arytenoid space residue;Lateral channel residue Penetration/Aspiration details (thin cup): Material enters airway, passes BELOW cords without attempt by patient to eject out (silent aspiration);Material enters airway, passes BELOW cords then ejected out Pharyngeal - Solids Pharyngeal - Puree: Premature spillage to valleculae;Reduced pharyngeal peristalsis;Reduced epiglottic inversion;Reduced  anterior laryngeal mobility;Reduced laryngeal elevation;Reduced airway/laryngeal closure;Reduced tongue base retraction;Pharyngeal residue - valleculae;Pharyngeal residue - pyriform sinuses;Pharyngeal residue - posterior pharnyx;Pharyngeal residue - cp segment Pharyngeal - Mechanical Soft: Premature spillage to valleculae;Reduced anterior laryngeal mobility;Reduced epiglottic inversion;Reduced pharyngeal peristalsis;Reduced laryngeal elevation;Reduced airway/laryngeal closure;Reduced tongue base retraction;Pharyngeal residue - valleculae;Pharyngeal residue - pyriform sinuses;Pharyngeal residue - posterior pharnyx;Pharyngeal residue - cp segment Pharyngeal - Pill: Not tested Pharyngeal Phase - Comment Pharyngeal Comment: Residuals more pronounced with puree and semi solids; cue to cough after swallow was effective in removing liquids to cords  Cervical Esophageal Phase    GO    Cervical Esophageal Phase Cervical Esophageal Phase: Impaired Cervical Esophageal Phase - Solids Puree: Prominent cricopharyngeal segment        Thank you,  Genene Churn, [place]  Corazon Nickolas 03/06/2014, 1:10 PM

## 2014-03-06 NOTE — Progress Notes (Signed)
[facility] PROGRESS NOTE  MARICE ANGELINO YBO:[id number] DOB: Oct 06, 1950 DOA: 03/02/2014 PCP: [name], MD  Assessment/Plan:  pneumonia : likley aspiration given swallow eval results.  Max temp 99.clindamycind day #2.   Dysphagia/neck mass:  CT neck with "malignancy" with widespread cystic and necrotic X-nodal appearing tumor. s/P biopsy of neck nodules by radiology 03/04/14. Await biopsy results. S/p swallow eval with clear indications for aspirations. Will follow recommendations   Colonic mass per CT. Likely primary. Await GI recommendations. May need colonoscopy?   Liver mass/lesion: in setting of cirrhosis likely related to ETOH abuse. see CT and abdominal US results. CT of abdomen/pelvis as below.  GI assistance greatly appreciated. Have requested oncology consult as well. See #2.  Pulmonary nodule: multiple per CT . Evaluated by Oncology who opine metastatic disease. May be candidate for palliative chemo given current performance status. Await pathologies. Will need OP follow up with cancer center.  Bacteremia: +Blood culture. See #1. May be contaminant  Anemia: microcytic. S/P 1.5 units RBC's. Stable   Hypokalemia: resolved. monitor   HTN: fair control. Continue LIsinopril  DM:  A1c 8.0. CBG range 92-212.  will start lememir and meal coverage low dose.   HLD:  continue statin  Hyponatremia: resolved. BMET in am  Severe malnutrition likely multifactorial i.e. Long hx ETOH abuse and metastatic disease primary unknown. Appreciate dietician assistance. Nutritional supplement   Code Status: full Family Communication: none present Disposition Plan: home when ready   Consultants:  GI  onclolgy  Procedures:  Biopsy 12/16 on neck mass  barrium swallow eval 03/06/14  Antibiotics:  levaquin 03/03/14>>03/05/14  Vancomycin 03/03/14>>03/05/14  clindaymycin 03/05/14>>  HPI/Subjective: Awake denies pain.   Objective: Filed Vitals:   03/06/14  0646  BP: 114/72  Pulse: 93  Temp: 99 F (37.2 C)  Resp: 20    Intake/Output Summary (Last 24 hours) at 03/06/14 1238 Last data filed at 03/06/14 0926  Gross per 24 hour  Intake   1425 ml  Output   1002 ml  Net    423 ml   Filed Weights   03/02/14 2300  Weight: 52.4 kg (115 lb 8.3 oz)    Exam:   General:  Thin frail appears comfortable  Cardiovascular: RRR No MGR No LE edema  Respiratory: normal effort BS coarse  Abdomen: flat soft non-tender +BS  Musculoskeletal: no clubbing or cyanosis   Data Reviewed: Basic Metabolic Panel:  Recent Labs Lab 03/02/14 2023 03/03/14 0549 03/03/14 0841 03/04/14 0609 03/05/14 1057 03/06/14 0610  NA 133* 138  --  135* 135* 134*  K 3.3* 4.1  --  4.2 5.1 4.2  CL 93* 100  --  97 97 97  CO2 28 28  --  29 29 27   GLUCOSE 288* 146*  --  172* 246* 135*  BUN 5* 4*  --  3* 6 5*  CREATININE 0.61 0.55  --  0.67 0.72 0.70  CALCIUM 8.7 8.1*  --  8.2* 8.7 8.5  MG  --   --  1.3*  --   --   --    Liver Function Tests:  Recent Labs Lab 03/03/14 0549  AST 13  ALT 6  ALKPHOS 146*  BILITOT 0.3  PROT 6.1  ALBUMIN 2.1*   No results for input(s): LIPASE, AMYLASE in the last 168 hours. No results for input(s): AMMONIA in the last 168 hours. CBC:  Recent Labs Lab 02/28/14 1435 03/02/14 2023 03/03/14 0549 03/04/14 0609 03/05/14 1057 03/06/14 0610  WBC 18.5* 14.0*  11.0* 10.4 14.9* 17.5*  NEUTROABS 16.1* 11.4*  --   --   --   --   HGB 10.4* 9.6* 8.2* 7.9* 12.0* 11.0*  HCT 33.8* 30.5* 26.2* 24.9* 36.9* 33.0*  MCV 69.5* 68.2* 68.2* 69.2* 72.8* 71.6*  PLT 505* 379 385 355 324 335   Cardiac Enzymes: No results for input(s): CKTOTAL, CKMB, CKMBINDEX, TROPONINI in the last 168 hours. BNP (last 3 results) No results for input(s): PROBNP in the last 8760 hours. CBG:  Recent Labs Lab 03/05/14 1321 03/05/14 1638 03/05/14 2218 03/06/14 0744 03/06/14 1213  GLUCAP 240* 137* 234* 92 212*    Recent Results (from the past 240  hour(s))  Culture, blood (routine x 2) Call MD if unable to obtain prior to antibiotics being given     Status: None (Preliminary result)   Collection Time: 03/02/14 11:23 PM  Result Value Ref Range Status   Specimen Description BLOOD RIGHT ANTECUBITAL  Final   Special Requests BOTTLES DRAWN AEROBIC AND ANAEROBIC 6CC EACH  Final   Culture NO GROWTH 3 DAYS  Final   Report Status PENDING  Incomplete  Culture, blood (routine x 2) Call MD if unable to obtain prior to antibiotics being given     Status: None   Collection Time: 03/02/14 11:23 PM  Result Value Ref Range Status   Specimen Description BLOOD RIGHT ANTECUBITAL  Final   Special Requests   Final    BOTTLES DRAWN AEROBIC AND ANAEROBIC AEB=6CC ANA=4CC   Culture  Setup Time   Final    03/04/2014 14:50 Performed at [place]   Final    STAPHYLOCOCCUS SPECIES (COAGULASE NEGATIVE) Note: THE SIGNIFICANCE OF ISOLATING THIS ORGANISM FROM A SINGLE VENIPUNCTURE CANNOT BE PREDICTED WITHOUT FURTHER CLINICAL AND CULTURE CORRELATION. SUSCEPTIBILITIES AVAILABLE ONLY ON REQUEST. Note: Gram Stain Report Called to,Read Back By and Verified With: THOMAS [id number]  ON 03/03/14 BY FORSYTHK Performed at [facility] Performed at [facility]    Report Status 03/05/2014 FINAL  Final  MRSA PCR Screening     Status: None   Collection Time: 03/03/14 12:10 AM  Result Value Ref Range Status   MRSA by PCR NEGATIVE NEGATIVE Final    Comment:        The GeneXpert MRSA Assay (FDA approved for NASAL specimens only), is one component of a comprehensive MRSA colonization surveillance program. It is not intended to diagnose MRSA infection nor to guide or monitor treatment for MRSA infections.      Studies: US Soft Tissue Head/neck  03/04/2014   CLINICAL DATA:  Neck mass, suspected cervical adenopathy by neck CT  EXAM: ULTRASOUND OF HEAD/NECK SOFT TISSUES  TECHNIQUE: Ultrasound examination of the head and neck soft tissues  was performed in the area of clinical concern.  COMPARISON:  None  FINDINGS: In the RIGHT cervical region, 3 soft tissue masses are identified likely representing enlarged lymph nodes.  More superior nodule is heterogeneous with areas of hyper echogenicity to hypoechoic question complex cystic change, 4.1 x 2.6 x 2.8 cm, containing internal blood flow on color Doppler imaging.  A more in inferior nodule measures 4.2 x 2.4 x 2.7 cm, is predominantly iso- to hypoechoic with mild heterogeneity, with more pronounced internal blood flow.  A smaller inferior RIGHT side nodule is identified measuring 1.5 x 1.1 x 1.0 cm, homogeneous relatively isoechoic.  In addition, an enlarged LEFT cervical node is identified, 2.3 x 3.6 x 1.8 cm, heterogeneous in echogenicity and containing internal  blood flow.  The 3 largest nodules contain tiny echogenic foci which likely represent tiny calcifications.  IMPRESSION: Multiple soft tissue nodules within the neck as above most likely representing enlarged abnormal appearing cervical lymph nodes.   Electronically Signed   By: Lavonia Dana M.D.   On: 03/04/2014 14:19   US Guided Needle Placement  03/04/2014   CLINICAL DATA:  Cervical adenopathy  EXAM: ULTRASOUND GUIDED CORE NEEDLE BIOPSY OF A RIGHT CERVICAL NODE  COMPARISON:  Previous exams.  FINDINGS: Procedure, risks, benefits and alternatives discussed with the patient.  Patient's questions answered.  Written informed consent for core biopsy of the enlarged RIGHT cervical lymph nodes was obtained.  Time-out protocol followed.  Dominant nodule in the inferior RIGHT cervical region localized by ultrasound  Nodule measures 4.2 x 2.4 x 2.7 cm.  Skin prepped and draped in usual sterile fashion.  Skin and overlying soft tissues anesthetized with 3 mL of 2% lidocaine lidocaine.  Under direct sonographic visualization, three 14 gauge core biopsies of the targeted enlarged RIGHT cervical lymph node were performed.  Ultrasound visualization was  utilized to confirm core sample acquisition within the targeted lymph node.  Obtained tissue cores appeared adequate and were placed in the appropriate container.  Procedure tolerated well by patient.  Specimen sent to pathology for evaluation.  No evidence of hematoma on post procedural imaging.  IMPRESSION: Three ultrasound guided core biopsies of an enlarged RIGHT cervical lymph node as above.  No acute abnormalities.   Electronically Signed   By: Lavonia Dana M.D.   On: 03/04/2014 15:53   Ct Abdomen Pelvis W Contrast  03/05/2014   CLINICAL DATA:  Malignancy.  EXAM: CT ABDOMEN AND PELVIS WITH CONTRAST  TECHNIQUE: Multidetector CT imaging of the abdomen and pelvis was performed using the standard protocol following bolus administration of intravenous contrast.  CONTRAST:  9mL OMNIPAQUE IOHEXOL 300 MG/ML SOLN, 162mL OMNIPAQUE IOHEXOL 300 MG/ML SOLN  COMPARISON:  CT scan of August 19, 2009.  FINDINGS: 23 x 16 mm irregular density is noted posteriorly in the right lower lobe concerning for metastatic disease. Smaller nodule is noted more anteriorly in the right lower lobe. Old right rib fractures are noted. No other significant osseous abnormality seen in the visualized portion of the abdomen or pelvis.  Hepatic cirrhosis is again noted with calcification present within right hepatic cleft. Mild cholelithiasis is noted. The spleen appears normal. Pancreatic head calcifications are noted suggesting chronic pancreatitis. There is interval development of severe pancreatic ductal dilatation suggesting obstruction. No pancreatic mass is noted. No biliary dilatation is noted. 2.1 cm ill-defined low density is noted in the medial portion the right hepatic lobe concerning for metastatic disease or malignancy. Adrenal glands appear normal. Nonobstructive calculus is noted in lower pole collecting system of left kidney. Bilateral hydronephrosis is noted without evidence of ureteral calculus. Mild urinary bladder distention is  noted.  Large irregular mass measuring 7.1 x 4.9 cm is seen involving the cecum consistent with colonic malignancy. Multiple enlarged satellite masses or lesions are noted concerning for metastatic disease in the right lower quadrant mesenteric region. The largest measures 3.3 x 2.6 cm in size. There is no evidence of bowel obstruction. Stool is noted in the rectum. No abnormal fluid collection is noted.  Atherosclerotic calcifications of abdominal aorta are noted without aneurysm formation.  Very large right inguinal hernia is noted which contains loops of small bowel, but does not result in incarceration or obstruction. This extends into the scrotum. Is significantly increased  in size compared to prior exam.  IMPRESSION: Large right inguinal hernia is noted which is significantly increased in size compared to prior exam, which extends into the scrotum. And contains loops of small bowel, but does not result in incarceration or obstruction.  Hepatic cirrhosis is noted. 2.1 cm ill-defined low density is noted in the medial portion of the right hepatic lobe consistent with metastatic disease or malignancy.  Mild cholelithiasis.  2.3 cm irregular density seen posteriorly in the right lower lobe concerning for metastatic disease.  Large irregular mass is seen involving the cecum consistent with colonic malignancy. Multiple and large satellite lesions are masses are noted in the right lower quadrant mesenteric region consistent with metastatic disease.  Nonobstructive left nephrolithiasis is noted.  Bilateral hydronephrosis is noted without evidence of obstructing calculus. This is concerning for distal ureteral obstruction of unknown etiology, or potentially may be due to urinary bladder distention.  Pancreatic head calcifications are noted suggesting chronic pancreatitis. There is interval development of severe dilatation of the pancreatic duct without common bile duct dilatation. This is concerning for ductal  obstruction.   Electronically Signed   By: Sabino Dick M.D.   On: 03/05/2014 21:40   [place] Core Biopsy  03/04/2014   CLINICAL DATA:  Cervical adenopathy  EXAM: ULTRASOUND GUIDED CORE NEEDLE BIOPSY OF A RIGHT CERVICAL NODE  COMPARISON:  Previous exams.  FINDINGS: Procedure, risks, benefits and alternatives discussed with the patient.  Patient's questions answered.  Written informed consent for core biopsy of the enlarged RIGHT cervical lymph nodes was obtained.  Time-out protocol followed.  Dominant nodule in the inferior RIGHT cervical region localized by ultrasound  Nodule measures 4.2 x 2.4 x 2.7 cm.  Skin prepped and draped in usual sterile fashion.  Skin and overlying soft tissues anesthetized with 3 mL of 2% lidocaine lidocaine.  Under direct sonographic visualization, three 14 gauge core biopsies of the targeted enlarged RIGHT cervical lymph node were performed.  Ultrasound visualization was utilized to confirm core sample acquisition within the targeted lymph node.  Obtained tissue cores appeared adequate and were placed in the appropriate container.  Procedure tolerated well by patient.  Specimen sent to pathology for evaluation.  No evidence of hematoma on post procedural imaging.  IMPRESSION: Three ultrasound guided core biopsies of an enlarged RIGHT cervical lymph node as above.  No acute abnormalities.   Electronically Signed   By: Lavonia Dana M.D.   On: 03/04/2014 15:53    Scheduled Meds: . clindamycin (CLEOCIN) IV  600 mg Intravenous Q8H  . feeding supplement (GLUCERNA SHAKE)  237 mL Oral TID BM  . folic acid  1 mg Oral Daily  . insulin aspart  0-9 Units Subcutaneous TID WC  . insulin aspart  5 Units Subcutaneous TID WC  . insulin detemir  5 Units Subcutaneous QHS  . levalbuterol  0.63 mg Nebulization TID  . lisinopril  5 mg Oral Daily  . multivitamin with minerals  1 tablet Oral Daily  . pantoprazole  40 mg Oral BID AC  . sucralfate  1 g Oral TID WC & HS  . thiamine  100 mg Oral Daily    Or  . thiamine  100 mg Intravenous Daily   Continuous Infusions: . 0.9 % NaCl with KCl 20 mEq / L 50 mL/hr at 03/06/14 0510    Principal Problem:   CAP (community acquired pneumonia) Active Problems:   Seizure disorder   HTN (hypertension)   Diabetes type 2, uncontrolled   Hyperlipidemia  Dysphagia   Hypokalemia   Hypoglycemia associated with diabetes   Cervical lymphadenopathy   Tobacco abuse   Anemia   Cirrhosis   Hypomagnesemia   Neck mass   Liver mass   Metastatic disease   Severe malnutrition   Bacteremia   Absolute anemia   Abnormal CT scan, esophagus   Cholelithiasis   Esophageal varices   Hydronephrosis   Ulcerative esophagitis   Colonic mass    Time spent: 40 minutes    [facility] Pager [phone]. If 7PM-7AM, please contact night-coverage at [email or web address], password [facility] 03/06/2014, 12:38 PM  LOS: 4 days

## 2014-03-06 NOTE — Progress Notes (Deleted)
Pt's O2 saturation 86% on room air at rest.

## 2014-03-06 NOTE — Evaluation (Signed)
Clinical/Bedside Swallow Evaluation Patient Details  Name: [name] MRN: [id number] DOB: [birth year]  Today's Date: 03/06/2014 Time: 9:46 AM  - 10:22 AM    Past Medical History:  Past Medical History  Diagnosis Date  . Diabetes mellitus   . Liver disease   . Enlarged prostate   . Hernia   . Alcohol dependence   . Seizures   . Hypercholesterolemia   . Liver lesion   . Pulmonary nodules   . Cervical mass     biopsy 03/04/14  . Metastatic disease   . Anemia   . Malnutrition   . Esophageal varices   . Hydronephrosis   . Cholelithiasis    Past Surgical History:  Past Surgical History  Procedure Laterality Date  . Appendectomy     HPI:  Mr. [name] is a 63 yo male who was admitted to [facility] 03/02/2014 with history of likely ETOH cirrhosis, presenting with episode of choking and 4-5 month history of solid food dysphagia. Multiple imaging studies (CT neck, CT chest, US abdomen) performed since admission with metastatic disease and unknown primary. CA 19-9 elevated at 95.8, AFP tumor marker normal. Oncology consult appreciated. Neck node biopsy completed yesterday, results pending. Also with esophageal wall thickening on imaging and EGD tentatively planned for today. Also with plans for CT A/P with contrast today. Anemia: multifactorial, iron-deficiency noted. No overt signs of GI bleeding. Hemoccult status unknown but has been ordered. Continue to monitor. Received 1 and 1/2 units of blood over night. SLP asked to clinically evaluate swallow due to reports of solid food dysphagia.   Assessment/Recommendations/Treatment Plan    SLP Assessment Clinical Impression Statement: Mr. [name] presents with positive signs/symptoms of penetration and possibly aspiration characterized by immediate and delayed cough/throat clear with thin liquids, ice cream, and puree. Only one overt episode of coughing, otherwise throat clearing and wet vocal quality. Recommend MBSS to objectively assess  swallow function and identify appropriate strategies as indicated. Above to RN and PA. Risk for Aspiration: Moderate Other Related Risk Factors: History of dysphagia, History of pneumonia  Swallow Evaluation Recommendations Recommended Consults: MBS Diet Recommendations:  (pending MBSS) Medication Administration: Whole meds with liquid Supervision: Patient able to self feed, Intermittent supervision to cue for compensatory strategies Compensations: Slow rate, Small sips/bites, Multiple dry swallows after each bite/sip, Effortful swallow Postural Changes and/or Swallow Maneuvers: Seated upright 90 degrees, Upright 30-60 min after meal Oral Care Recommendations: Oral care BID Other Recommendations: Clarify dietary restrictions  Treatment Plan Treatment Plan Recommendations: F/U MBS in ___ days (Comment) Interventions: Aspiration precaution training, Trials of upgraded texture/liquids, Diet toleration management by SLP, Pharyngeal strengthening exercises, Patient/family education  Prognosis Prognosis for Safe Diet Advancement: Guarded Barriers to Reach Goals:  (neck mass)  Individuals Consulted Consulted and Agree with Results and Recommendations: Patient, PA, RN   Swallow Study Prior Functional Status   Lives with sister  General  Date of Onset: 03/02/14 HPI: Mr. [name] is a 63 yo male who was admitted to [facility] 03/02/2014 with history of likely ETOH cirrhosis, presenting with episode of choking and 4-5 month history of solid food dysphagia. Multiple imaging studies (CT neck, CT chest, US abdomen) performed since admission with metastatic disease and unknown primary. CA 19-9 elevated at 95.8, AFP tumor marker normal. Oncology consult appreciated. Neck node biopsy completed yesterday, results pending. Also with esophageal wall thickening on imaging and EGD tentatively planned for today. Also with plans for CT A/P with contrast today. Anemia: multifactorial, iron-deficiency noted. No  overt signs of GI bleeding. Hemoccult status unknown but has been ordered. Continue to monitor. Received 1 and 1/2 units of blood over night. SLP asked to clinically evaluate swallow due to reports of solid food dysphagia. Type of Study: Bedside swallow evaluation Previous Swallow Assessment: 2010 Diet Prior to this Study: Thin liquids (full liquids) Temperature Spikes Noted: No Respiratory Status: Room air History of Recent Intubation: No Behavior/Cognition: Alert, Cooperative Oral Cavity - Dentition: Edentulous Self-Feeding Abilities: Able to feed self Patient Positioning: Upright in bed Baseline Vocal Quality: Wet Volitional Cough: Congested, Weak Volitional Swallow: Able to elicit  Oral Motor/Sensory Function  Overall Oral Motor/Sensory Function: Impaired Labial ROM: Reduced left;Reduced right Labial Symmetry: Within Functional Limits Labial Strength: Reduced Labial Sensation: Within Functional Limits Lingual ROM: Reduced left Lingual Symmetry: Abnormal symmetry right Lingual Strength: Reduced Lingual Sensation: Within Functional Limits Facial ROM: Reduced left Facial Symmetry: Left droop Facial Sensation: Within Functional Limits Velum: Within Functional Limits Mandible: Within Functional Limits  Consistency Results  Ice Chips Ice chips: Within functional limits Presentation: Spoon  Thin Liquid Thin Liquid: Impaired Presentation: Cup;Self Fed Pharyngeal  Phase Impairments: Wet Vocal Quality;Cough - Immediate  Nectar Thick Liquid Nectar Thick Liquid: Impaired Presentation: Spoon Pharyngeal Phase Impairments: Throat Clearing - Delayed  Honey Thick Liquid Honey Thick Liquid: Not tested  Puree Puree: Impaired Presentation: Self Fed;Spoon Pharyngeal Phase Impairments: Multiple swallows;Throat Clearing - Delayed  Solid Solid: Not tested  Thank you,  Genene Churn, [place] 03/06/2014,10:35 AM

## 2014-03-07 DIAGNOSIS — J690 Pneumonitis due to inhalation of food and vomit: Principal | ICD-10-CM

## 2014-03-07 LAB — BASIC METABOLIC PANEL
ANION GAP: 11 (ref 5–15)
BUN: 4 mg/dL — AB (ref 6–23)
CO2: 26 mEq/L (ref 19–32)
CREATININE: 0.65 mg/dL (ref 0.50–1.35)
Calcium: 8.5 mg/dL (ref 8.4–10.5)
Chloride: 95 mEq/L — ABNORMAL LOW (ref 96–112)
GFR calc Af Amer: >90 mL/min (ref >90)
Glucose, Bld: 235 mg/dL — ABNORMAL HIGH (ref 70–99)
POTASSIUM: 4.6 meq/L (ref 3.7–5.3)
Sodium: 132 mEq/L — ABNORMAL LOW (ref 137–147)

## 2014-03-07 LAB — CBC
HEMATOCRIT: 34.7 % — AB (ref 39.0–52.0)
Hemoglobin: 11.2 g/dL — ABNORMAL LOW (ref 13.0–17.0)
MCH: 23.1 pg — ABNORMAL LOW (ref 26.0–34.0)
MCHC: 32.3 g/dL (ref 30.0–36.0)
MCV: 71.7 fL — AB (ref 78.0–100.0)
Platelets: 369 10*3/uL (ref 150–400)
RBC: 4.84 MIL/uL (ref 4.22–5.81)
RDW: 17.5 % — ABNORMAL HIGH (ref 11.5–15.5)
WBC: 14.1 10*3/uL — AB (ref 4.0–10.5)

## 2014-03-07 LAB — GLUCOSE, CAPILLARY
GLUCOSE-CAPILLARY: 98 mg/dL (ref 70–99)
GLUCOSE-CAPILLARY: 194 mg/dL — AB (ref 70–99)
GLUCOSE-CAPILLARY: 217 mg/dL — AB (ref 70–99)
Glucose-Capillary: 168 mg/dL — ABNORMAL HIGH (ref 70–99)

## 2014-03-07 LAB — CULTURE, BLOOD (ROUTINE X 2): Culture: NO GROWTH

## 2014-03-07 MED ORDER — POLYETHYLENE GLYCOL 3350 17 G PO PACK
17.0000 g | PACK | ORAL | Status: AC
  Administered 2014-03-07 (×3): 17 g via ORAL
  Filled 2014-03-07 (×2): qty 1

## 2014-03-07 MED ORDER — SUCRALFATE 1 GM/10ML PO SUSP
1.0000 g | Freq: Three times a day (TID) | ORAL | Status: DC
  Administered 2014-03-07 – 2014-03-08 (×2): 1 g via ORAL
  Filled 2014-03-07 (×2): qty 10

## 2014-03-07 NOTE — Progress Notes (Signed)
ANTIBIOTIC CONSULT NOTE  Pharmacy Consult for Renal Adjust ABX Indication: PNA  No Known Allergies  Patient Measurements: Height: 6' (182.9 cm) Weight: 115 lb 8.3 oz (52.4 kg) IBW/kg (Calculated) : 77.6 Adjusted Body Weight:   Vital Signs: Temp: 98.7 F (37.1 C) (12/19 0635) Temp Source: Oral (12/19 0635) BP: 123/63 mmHg (12/19 0838) Pulse Rate: 95 (12/19 0838) Intake/Output from previous day: 12/18 0701 - 12/19 0700 In: 150 [IV Piggyback:150] Out: 3762 [Urine:3950; Stool:2] Intake/Output from this shift: Total I/O In: 240 [P.O.:240] Out: -   Labs:  Recent Labs  03/05/14 1057 03/06/14 0610 03/07/14 0557  WBC 14.9* 17.5* 14.1*  HGB 12.0* 11.0* 11.2*  PLT 324 335 369  CREATININE 0.72 0.70 0.65   Estimated Creatinine Clearance: 68.2 mL/min (by C-G formula based on Cr of 0.65). No results for input(s): VANCOTROUGH, VANCOPEAK, VANCORANDOM, GENTTROUGH, GENTPEAK, GENTRANDOM, TOBRATROUGH, TOBRAPEAK, TOBRARND, AMIKACINPEAK, AMIKACINTROU, AMIKACIN in the last 72 hours.   Microbiology: Recent Results (from the past 720 hour(s))  Culture, blood (routine x 2) Call MD if unable to obtain prior to antibiotics being given     Status: None   Collection Time: 03/02/14 11:23 PM  Result Value Ref Range Status   Specimen Description BLOOD RIGHT ANTECUBITAL  Final   Special Requests BOTTLES DRAWN AEROBIC AND ANAEROBIC [place]  Final   Culture NO GROWTH 5 DAYS  Final   Report Status 03/07/2014 FINAL  Final  Culture, blood (routine x 2) Call MD if unable to obtain prior to antibiotics being given     Status: None   Collection Time: 03/02/14 11:23 PM  Result Value Ref Range Status   Specimen Description BLOOD RIGHT ANTECUBITAL  Final   Special Requests   Final    BOTTLES DRAWN AEROBIC AND ANAEROBIC AEB=6CC ANA=4CC   Culture  Setup Time   Final    03/04/2014 14:50 Performed at [facility]    Culture   Final    STAPHYLOCOCCUS SPECIES (COAGULASE NEGATIVE) Note: THE  SIGNIFICANCE OF ISOLATING THIS ORGANISM FROM A SINGLE VENIPUNCTURE CANNOT BE PREDICTED WITHOUT FURTHER CLINICAL AND CULTURE CORRELATION. SUSCEPTIBILITIES AVAILABLE ONLY ON REQUEST. Note: Gram Stain Report Called to,Read Back By and Verified With: THOMAS [id number]  ON 03/03/14 BY FORSYTHK Performed at [facility] Performed at [facility]    Report Status 03/05/2014 FINAL  Final  MRSA PCR Screening     Status: None   Collection Time: 03/03/14 12:10 AM  Result Value Ref Range Status   MRSA by PCR NEGATIVE NEGATIVE Final    Comment:        The GeneXpert MRSA Assay (FDA approved for NASAL specimens only), is one component of a comprehensive MRSA colonization surveillance program. It is not intended to diagnose MRSA infection nor to guide or monitor treatment for MRSA infections.   Clostridium Difficile by PCR     Status: None   Collection Time: 03/06/14  3:20 PM  Result Value Ref Range Status   C difficile by pcr NEGATIVE NEGATIVE Final    Medications:  Scheduled:  . clindamycin (CLEOCIN) IV  600 mg Intravenous Q8H  . feeding supplement (GLUCERNA SHAKE)  237 mL Oral TID BM  . folic acid  1 mg Oral Daily  . insulin aspart  0-9 Units Subcutaneous TID WC  . insulin aspart  5 Units Subcutaneous TID WC  . insulin detemir  5 Units Subcutaneous QHS  . levalbuterol  0.63 mg Nebulization TID  . lisinopril  5 mg Oral Daily  .  multivitamin with minerals  1 tablet Oral Daily  . pantoprazole  40 mg Oral BID AC  . sucralfate  1 g Oral TID WC & HS  . thiamine  100 mg Oral Daily   Or  . thiamine  100 mg Intravenous Daily   Assessment: Pneumonia : likley aspiration given swallow eval results. Max temp 99.clindamycind day #2.   Goal of Therapy:  Eradicate infection.   Plan:  Continue Clindamycin 600mg  IV every 8 hours. Dose stable. Sign off.  Biagio Quint R 03/07/2014,11:15 AM

## 2014-03-07 NOTE — Progress Notes (Signed)
[facility] PROGRESS NOTE  [name] WUJ:[id number] DOB: 12/13/1950 DOA: 03/02/2014 PCP: [name], MD    Code Status: Full code Family Communication: Discussed briefly with sister Disposition Plan: To be determined   Consultants:  Gastroenterology  Oncology  Procedures:  EGD per Dr. Gala Romney 03/05/14: ENDOSCOPIC IMPRESSION: Ulcerative/erosive reflux esophagitis?"extensive superimposed on nonbleeding esophageal varices grade 2. Hiatal hernia. Abnormal gastric mucosa?"status post gastric biopsy . Patent esophagus.   IR biopsy neck mass/node 03/04/14.  Antibiotics:  Clindamycin 03/05/14>   03/03/14>> 03/05/14  Vancomycin 03/03/14>> 03/05/14  HPI/Subjective: The patient denies chest pain, abdominal pain, nausea, vomiting. He does not have as much trouble swallowing and would like to have some cake and/or butter and crackers if he could.   Objective: Filed Vitals:   03/07/14 1445  BP: 125/73  Pulse: 86  Temp: 98.1 F (36.7 C)  Resp: 20   temperature 98.1. Respiratory rate 20. Oxygen saturation 98% on room air.  Intake/Output Summary (Last 24 hours) at 03/07/14 1905 Last data filed at 03/07/14 1900  Gross per 24 hour  Intake   1420 ml  Output   4600 ml  Net  -3180 ml   Filed Weights   03/02/14 2300  Weight: 52.4 kg (115 lb 8.3 oz)    Exam:   General:  Very debilitated appearing 63 year old man laying in bed, in no acute distress.   Cardiovascular: S1, S2, with no significant murmurs rubs or gallops.   Respiratory: Upper airway crackles/rhonchi, partially cleared with coughing. Breathing is nonlabored.   Abdomen: Positive bowel sounds, soft, nontender, nondistended.   Musculoskeletal: Mild diffuse muscle atrophy; no acute hot red joints.   Data Reviewed: Basic Metabolic Panel:  Recent Labs Lab 03/03/14 0549 03/03/14 0841 03/04/14 0609 03/05/14 1057 03/06/14 0610 03/07/14 0557  NA 138  --  135* 135* 134* 132*  K 4.1  --   4.2 5.1 4.2 4.6  CL 100  --  97 97 97 95*  CO2 28  --  29 29 27 26   GLUCOSE 146*  --  172* 246* 135* 235*  BUN 4*  --  3* 6 5* 4*  CREATININE 0.55  --  0.67 0.72 0.70 0.65  CALCIUM 8.1*  --  8.2* 8.7 8.5 8.5  MG  --  1.3*  --   --   --   --    Liver Function Tests:  Recent Labs Lab 03/03/14 0549  AST 13  ALT 6  ALKPHOS 146*  BILITOT 0.3  PROT 6.1  ALBUMIN 2.1*   No results for input(s): LIPASE, AMYLASE in the last 168 hours. No results for input(s): AMMONIA in the last 168 hours. CBC:  Recent Labs Lab 03/02/14 2023 03/03/14 0549 03/04/14 0609 03/05/14 1057 03/06/14 0610 03/07/14 0557  WBC 14.0* 11.0* 10.4 14.9* 17.5* 14.1*  NEUTROABS 11.4*  --   --   --   --   --   HGB 9.6* 8.2* 7.9* 12.0* 11.0* 11.2*  HCT 30.5* 26.2* 24.9* 36.9* 33.0* 34.7*  MCV 68.2* 68.2* 69.2* 72.8* 71.6* 71.7*  PLT 379 385 355 324 335 369   Cardiac Enzymes: No results for input(s): CKTOTAL, CKMB, CKMBINDEX, TROPONINI in the last 168 hours. BNP (last 3 results) No results for input(s): PROBNP in the last 8760 hours. CBG:  Recent Labs Lab 03/06/14 1646 03/06/14 2129 03/07/14 0738 03/07/14 1127 03/07/14 1637  GLUCAP 153* 272* 168* 98 194*    Recent Results (from the past 240 hour(s))  Culture, blood (routine x 2)  Call MD if unable to obtain prior to antibiotics being given     Status: None   Collection Time: 03/02/14 11:23 PM  Result Value Ref Range Status   Specimen Description BLOOD RIGHT ANTECUBITAL  Final   Special Requests BOTTLES DRAWN AEROBIC AND ANAEROBIC [place]  Final   Culture NO GROWTH 5 DAYS  Final   Report Status 03/07/2014 FINAL  Final  Culture, blood (routine x 2) Call MD if unable to obtain prior to antibiotics being given     Status: None   Collection Time: 03/02/14 11:23 PM  Result Value Ref Range Status   Specimen Description BLOOD RIGHT ANTECUBITAL  Final   Special Requests   Final    BOTTLES DRAWN AEROBIC AND ANAEROBIC AEB=6CC ANA=4CC   Culture  Setup  Time   Final    03/04/2014 14:50 Performed at [facility]    Culture   Final    STAPHYLOCOCCUS SPECIES (COAGULASE NEGATIVE) Note: THE SIGNIFICANCE OF ISOLATING THIS ORGANISM FROM A SINGLE VENIPUNCTURE CANNOT BE PREDICTED WITHOUT FURTHER CLINICAL AND CULTURE CORRELATION. SUSCEPTIBILITIES AVAILABLE ONLY ON REQUEST. Note: Gram Stain Report Called to,Read Back By and Verified With: THOMAS [id number]  ON 03/03/14 BY FORSYTHK Performed at [facility] Performed at [facility]    Report Status 03/05/2014 FINAL  Final  MRSA PCR Screening     Status: None   Collection Time: 03/03/14 12:10 AM  Result Value Ref Range Status   MRSA by PCR NEGATIVE NEGATIVE Final    Comment:        The GeneXpert MRSA Assay (FDA approved for NASAL specimens only), is one component of a comprehensive MRSA colonization surveillance program. It is not intended to diagnose MRSA infection nor to guide or monitor treatment for MRSA infections.   Clostridium Difficile by PCR     Status: None   Collection Time: 03/06/14  3:20 PM  Result Value Ref Range Status   C difficile by pcr NEGATIVE NEGATIVE Final     Studies: Ct Abdomen Pelvis W Contrast  03/05/2014   CLINICAL DATA:  Malignancy.  EXAM: CT ABDOMEN AND PELVIS WITH CONTRAST  TECHNIQUE: Multidetector CT imaging of the abdomen and pelvis was performed using the standard protocol following bolus administration of intravenous contrast.  CONTRAST:  15mL OMNIPAQUE IOHEXOL 300 MG/ML SOLN, 11mL OMNIPAQUE IOHEXOL 300 MG/ML SOLN  COMPARISON:  CT scan of August 19, 2009.  FINDINGS: 23 x 16 mm irregular density is noted posteriorly in the right lower lobe concerning for metastatic disease. Smaller nodule is noted more anteriorly in the right lower lobe. Old right rib fractures are noted. No other significant osseous abnormality seen in the visualized portion of the abdomen or pelvis.  Hepatic cirrhosis is again noted with calcification present within right  hepatic cleft. Mild cholelithiasis is noted. The spleen appears normal. Pancreatic head calcifications are noted suggesting chronic pancreatitis. There is interval development of severe pancreatic ductal dilatation suggesting obstruction. No pancreatic mass is noted. No biliary dilatation is noted. 2.1 cm ill-defined low density is noted in the medial portion the right hepatic lobe concerning for metastatic disease or malignancy. Adrenal glands appear normal. Nonobstructive calculus is noted in lower pole collecting system of left kidney. Bilateral hydronephrosis is noted without evidence of ureteral calculus. Mild urinary bladder distention is noted.  Large irregular mass measuring 7.1 x 4.9 cm is seen involving the cecum consistent with colonic malignancy. Multiple enlarged satellite masses or lesions are noted concerning for metastatic disease in the right  lower quadrant mesenteric region. The largest measures 3.3 x 2.6 cm in size. There is no evidence of bowel obstruction. Stool is noted in the rectum. No abnormal fluid collection is noted.  Atherosclerotic calcifications of abdominal aorta are noted without aneurysm formation.  Very large right inguinal hernia is noted which contains loops of small bowel, but does not result in incarceration or obstruction. This extends into the scrotum. Is significantly increased in size compared to prior exam.  IMPRESSION: Large right inguinal hernia is noted which is significantly increased in size compared to prior exam, which extends into the scrotum. And contains loops of small bowel, but does not result in incarceration or obstruction.  Hepatic cirrhosis is noted. 2.1 cm ill-defined low density is noted in the medial portion of the right hepatic lobe consistent with metastatic disease or malignancy.  Mild cholelithiasis.  2.3 cm irregular density seen posteriorly in the right lower lobe concerning for metastatic disease.  Large irregular mass is seen involving the cecum  consistent with colonic malignancy. Multiple and large satellite lesions are masses are noted in the right lower quadrant mesenteric region consistent with metastatic disease.  Nonobstructive left nephrolithiasis is noted.  Bilateral hydronephrosis is noted without evidence of obstructing calculus. This is concerning for distal ureteral obstruction of unknown etiology, or potentially may be due to urinary bladder distention.  Pancreatic head calcifications are noted suggesting chronic pancreatitis. There is interval development of severe dilatation of the pancreatic duct without common bile duct dilatation. This is concerning for ductal obstruction.   Electronically Signed   By: Sabino Dick M.D.   On: 03/05/2014 21:40   Dg Swallowing Func-speech Pathology  03/06/2014   Ephraim Hamburger, CCC-SLP     03/06/2014  1:12 PM Objective Swallowing Evaluation: Modified Barium Swallowing Study    Patient Details  Name: [name] MRN: [id number] Date of Birth: 10-20-1950  Today's Date: 03/06/2014 Time: 1130-1200 SLP Time Calculation (min) (ACUTE ONLY): 30 min  Past Medical History:  Past Medical History  Diagnosis Date  . Diabetes mellitus   . Liver disease   . Enlarged prostate   . Hernia   . Alcohol dependence   . Seizures   . Hypercholesterolemia   . Liver lesion   . Pulmonary nodules   . Cervical mass     biopsy 03/04/14  . Metastatic disease   . Anemia   . Malnutrition   . Esophageal varices   . Hydronephrosis   . Cholelithiasis    Past Surgical History:  Past Surgical History  Procedure Laterality Date  . 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  Multiple imaging studies (CT neck, CT chest, US abdomen)  performed since admission with metastatic disease and unknown  primary. CA 19-9 elevated at 95.8, AFP tumor marker normal.  Oncology consult  appreciated. Neck node biopsy completed  yesterday, results pending. Also with esophageal wall thickening  on imaging and EGD tentatively planned for today. Also with plans  for CT A/P with contrast today. Anemia: multifactorial,  iron-deficiency noted. No overt signs of GI bleeding. Hemoccult  status unknown but has been ordered. Continue to monitor.  Received 1 and 1/2 units of blood over night. SLP asked to  clinically evaluate swallow due to reports of solid food  dysphagia.  Assessment / Plan / Recommendation Clinical Impression  Dysphagia Diagnosis: Mild oral phase dysphagia;Moderate  pharyngeal phase dysphagia;Severe pharyngeal phase dysphagia Clinical impression: [name] presents with mild oral phase  dysphagia in setting of edentulous status with impaired  mastication and weak lingual manipulation resulting in prolonged  oral transit and residue post swallow; mod/severe sensorimotor  and anatomically altered pharyngeal phase dysphagia characterized  by mild delay in swallow initiation, decreased tongue base  retraction, decreased hyolaryngeal excursion, decreased  pharyngeal pressure and decreased airway protection resulting in  significant residuals in pharynx post swallow in the valleculae,  pyriforms, and posterior pharyngeal wall. Residuals are more  pronounced with puree and semi solid textures, however liquids  are pooled in pyriforms which pt transiently aspirates (trace  amounts) post swallow. Penetration occurs during the swallow  (from residuals most likely) with liquids, however with cued  cough, pt is able to clear. Alternating semi solids and liquids  was only marginally effective in reducing pharyngeal residue. Pt  did not overtly cough during trace aspiration down posterior  tracheal wall, however he had wet vocal qualiity and some throat  clearing. Honey-thick liquids were not assessed as it was felt  that it would only increase residuals. Pt has osteophytes along  the cervical and  thoracic spine with a more pronounced  impingement into pharyngeal space at C4-5 which is where most  solid residuals are delayed/pooled. While this is obviously not  an acute finding, [name] has significant pharyngeal weakness  which makes it difficult to clear the residuals. The presence of  suspected mass was visualized and may also have deliterious  effects on pharyngeal swallow (particularly cricopharyngeal  function) also contributing to significant residuals. Pt is  judged to be at risk for all textures/consistencies, however a  "safest diet" can be recommended to avoid alternative sources of  nutrition. Recommend D1/puree and thin liquids; pt to cough after  each swallow and repeat swallow throughout meals. Recommendations  reviewed with pt and precautions posted. No family present.  Prognosis for improvement is poor given mass.    Treatment Recommendation  Therapy as outlined in treatment plan below    Diet Recommendation Dysphagia 1 (Puree);Thin liquid   Liquid Administration via: Cup Medication Administration: Crushed with puree Supervision: Patient able to self feed;Intermittent supervision  to cue for compensatory strategies Compensations: Slow rate;Multiple dry swallows after each  bite/sip;Follow solids with liquid;Hard cough after  swallow;Effortful swallow Postural Changes and/or Swallow Maneuvers: Seated upright 90  degrees;Upright 30-60 min after meal;Out of bed for meals    Other  Recommendations Recommended Consults: MBS Oral Care Recommendations: Oral care BID Other Recommendations: Clarify dietary restrictions   Follow Up Recommendations  Home health SLP    Frequency and Duration min 1 x/week  1 week   Pertinent Vitals/Pain VSS    SLP Swallow Goals   Pt will demonstrate safe and efficient consumption of least  restrictive diet with use of strategies as needed.    General Date of Onset: 03/02/14 HPI: [name] is a 63 yo male who was admitted to [facility]  03/02/2014 with history of  likely ETOH cirrhosis, presenting with  episode of choking and 4-5 month history of solid food dysphagia.  Multiple imaging studies (CT neck, CT chest, US abdomen)  performed since admission with metastatic disease and unknown  primary. CA 19-9 elevated at 95.8, AFP tumor marker normal.  Oncology consult appreciated. Neck node biopsy completed  yesterday, results pending. Also with esophageal wall thickening  on imaging and EGD tentatively planned for today. Also with plans  for CT A/P with contrast today. Anemia: multifactorial,  iron-deficiency noted. No overt signs of GI bleeding. Hemoccult  status unknown but has been ordered. Continue to monitor.  Received 1 and 1/2 units of blood over night. SLP asked to  clinically evaluate swallow due to reports of solid food  dysphagia. Type of Study: Modified Barium Swallowing Study Reason for Referral: Objectively evaluate swallowing function Previous Swallow Assessment: 2010 Diet Prior to this Study: Thin liquids (full liquids) Temperature Spikes Noted: No Respiratory Status: Room air History of Recent Intubation: No Behavior/Cognition: Alert;Cooperative Oral Cavity - Dentition: Edentulous Oral Motor / Sensory Function: Impaired - see Bedside swallow  eval Self-Feeding Abilities: Able to feed self Patient Positioning: Upright in chair Baseline Vocal Quality: Wet Volitional Cough: Congested;Weak Volitional Swallow: Able to elicit Anatomy: Other (Comment) (osteophytes; mass-like opaque shape  noted at level of ~C6) Pharyngeal Secretions: Not observed secondary MBS    Reason for Referral Objectively evaluate swallowing function   Oral Phase Oral Preparation/Oral Phase Oral Phase: Impaired Oral - Solids Oral - Mechanical Soft: Impaired mastication;Weak lingual  manipulation;Reduced posterior propulsion;Delayed oral  transit;Lingual/palatal residue   Pharyngeal Phase Pharyngeal Phase Pharyngeal Phase: Impaired Pharyngeal - Nectar Pharyngeal - Nectar Cup: Premature spillage to  pyriform  sinuses;Reduced pharyngeal peristalsis;Reduced epiglottic  inversion;Reduced anterior laryngeal mobility;Reduced laryngeal  elevation;Reduced airway/laryngeal closure;Reduced tongue base  retraction;Penetration/Aspiration after swallow;Trace  aspiration;Pharyngeal residue - valleculae;Pharyngeal residue -  pyriform sinuses;Pharyngeal residue - cp segment;Lateral channel  residue Penetration/Aspiration details (nectar cup): Material enters  airway, passes BELOW cords without attempt by patient to eject  out (silent aspiration);Material enters airway, passes BELOW  cords then ejected out Pharyngeal - Thin Pharyngeal - Thin Cup: Premature spillage to pyriform  sinuses;Premature spillage to valleculae;Reduced pharyngeal  peristalsis;Reduced epiglottic inversion;Reduced anterior  laryngeal mobility;Reduced laryngeal elevation;Reduced  airway/laryngeal closure;Reduced tongue base  retraction;Penetration/Aspiration after swallow;Trace  aspiration;Pharyngeal residue - valleculae;Pharyngeal residue -  pyriform sinuses;Pharyngeal residue - cp segment;Inter-arytenoid  space residue;Lateral channel residue Penetration/Aspiration details (thin cup): Material enters  airway, passes BELOW cords without attempt by patient to eject  out (silent aspiration);Material enters airway, passes BELOW  cords then ejected out Pharyngeal - Solids Pharyngeal - Puree: Premature spillage to valleculae;Reduced  pharyngeal peristalsis;Reduced epiglottic inversion;Reduced  anterior laryngeal mobility;Reduced laryngeal elevation;Reduced  airway/laryngeal closure;Reduced tongue base  retraction;Pharyngeal residue - valleculae;Pharyngeal residue -  pyriform sinuses;Pharyngeal residue - posterior  pharnyx;Pharyngeal residue - cp segment Pharyngeal - Mechanical Soft: Premature spillage to  valleculae;Reduced anterior laryngeal mobility;Reduced epiglottic  inversion;Reduced pharyngeal peristalsis;Reduced laryngeal  elevation;Reduced  airway/laryngeal closure;Reduced tongue base  retraction;Pharyngeal residue - valleculae;Pharyngeal residue -  pyriform sinuses;Pharyngeal residue - posterior  pharnyx;Pharyngeal residue - cp segment Pharyngeal - Pill: Not tested Pharyngeal Phase - Comment Pharyngeal Comment: Residuals more pronounced with puree and semi  solids; cue to cough after swallow was effective in removing  liquids to cords  Cervical Esophageal Phase    GO    Cervical Esophageal Phase Cervical Esophageal Phase: Impaired Cervical Esophageal Phase - Solids Puree: Prominent cricopharyngeal segment        Thank you,  Genene Churn, CCC-SLP [phone]  PORTER,DABNEY 03/06/2014, 1:10 PM     Scheduled Meds: . clindamycin (CLEOCIN) IV  600 mg Intravenous Q8H  . feeding supplement (GLUCERNA SHAKE)  237 mL Oral TID BM  . folic acid  1 mg Oral Daily  . insulin aspart  0-9 Units Subcutaneous TID WC  . insulin aspart  5 Units Subcutaneous  TID WC  . insulin detemir  5 Units Subcutaneous QHS  . levalbuterol  0.63 mg Nebulization TID  . lisinopril  5 mg Oral Daily  . multivitamin with minerals  1 tablet Oral Daily  . pantoprazole  40 mg Oral BID AC  . polyethylene glycol  17 g Oral Q1 Hr x 6  . thiamine  100 mg Oral Daily   Or  . thiamine  100 mg Intravenous Daily   Continuous Infusions: . 0.9 % NaCl with KCl 20 mEq / L 50 mL/hr at 03/06/14 0510    Assessment and plan.  Principal Problem:   Aspiration pneumonia Active Problems:   Dysphagia   Cervical lymphadenopathy   Metastatic disease   Seizure disorder   HTN (hypertension)   Diabetes type 2, uncontrolled   Hyperlipidemia   Hypokalemia   Hypoglycemia associated with diabetes   Tobacco abuse   Anemia   Cirrhosis   Hypomagnesemia   Neck mass   Liver mass   Severe malnutrition   Bacteremia   Absolute anemia   Abnormal CT scan, esophagus   Cholelithiasis   Esophageal varices   Hydronephrosis   Ulcerative esophagitis   Colonic mass   1. Widespread  metastatic disease with lymphadenopathy of the neck, colon mass, liver masses, pulmonary nodules, etc. Status post nodal biopsy by IR. Pathology revealed squamous cell carcinoma, but primary is still unknown. Gastroenterologist, Dr. Oneida Alar reviewed the results with IR to see if there was another safe place to biopsy. Apparently, she plans a colonoscopy on 12/20 for tissue diagnosis from the colon mass.  Pneumonia, likely aspiration pneumonia. Continue clindamycin and supportive treatment.  Ulcerated esophagitis, esophageal varices, per EGD by Dr. Gala Romney. We'll continue PPI. Will add Carafate which was intended.  Dysphagia, likely secondary to ulcerated esophagitis. We'll continue a full liquid diet. Speech therapist evaluation noted-she recommended dysphagia 1 (pured) with thin liquids. Will avoid solids, although the patient request them.  Type 2 diabetes mellitus. Hemoglobin A1c was 8.0. CBGs have been reasonable. We'll continue Levemir and sliding-scale NovoLog.  Anemia, likely multifactorial. Results of the anemia panel noted. Status post 1.5 units of packed red blood cell transfusions. Hemoglobin stable over the past 48 hours.  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  History of alcohol abuse/tobacco abuse. Patient was encouraged to stop smoking and drinking alcohol indefinitely. We'll continue vitamin therapy and Ativan protocol. There are no signs of alcohol withdrawal syndrome.  Hypomagnesemia and hypokalemia. Status post supplementation IV and by mouth.  Hypertension. Currently stable on lisinopril.  One out of 2 blood cultures positive for coagulase-negative Staphylococcus. Likely a contaminant. C. difficile PCR negative.  Time spent: 35 minutes.    [facility] Pager [phone]. If 7PM-7AM, please contact night-coverage at [email or web address], password [facility] 03/07/2014, 7:05 PM  LOS: 5 days

## 2014-03-07 NOTE — Progress Notes (Addendum)
Patient ID: [name], male   DOB: [birth year], 63 y.o.   MRN: [id number]   ADDENDUM Mar 07 1699-REVIEWED IMAGING WITH DR. Delane Ginger. BENEFITS OF IR BX DO NOT OUTWEIGH RISKS OF TISSUE DIAGNOSIS OBTAINED BY TCS, FULL LIQUID DIET. MIRALAX Q1H x3 TODAY. CONTINUE BOWEL PREP ON DEC 20. PLAN FOR TCS DEC 21.   Assessment/Plan: PT'S NODE Bx: SQUAMOUS CA. PT NEEDS TISSUE DIAGNOSIS FROM LESION IN COLON/PELVIS.  PLAN: 1. AWAITING CONSULT FROM IR(Mar 07 1204) TO DETERMINE IF PT NEEDS A COLONOSCOPY. 2. SUPPORTIVE CARE 3.AWAIT ADDITIONAL ONCOLOGY RECOMMENDATIONS 4. Explained path results to pt. HE VOICED HIS UNDERSTANDING.   Subjective: Since I last evaluated the patient HE IS TOLERATING POS. C/o r neck pain. BOWELS MOVING. NO NAUSEA OR VOMITING.  Objective: Vital signs in last 24 hours: Filed Vitals:   03/07/14 0838  BP: 123/63  Pulse: 95  Temp:   Resp:     General appearance: alert, cooperative and no distress Resp: coarse breath sounds bilaterally, fair air movement, no wheeze Cardio: regular rate and rhythm  Lab Results: NODE PATHOLOGY: SQ CELL CARCINOMA  Studies/Results: Dg Swallowing Func-speech Pathology  03/06/2014   Ephraim Hamburger, CCC-SLP     03/06/2014  1:12 PM Objective Swallowing Evaluation: Modified Barium Swallowing Study    Patient Details  Name: [name] MRN: [id number] Date of Birth: 08/22/1950  Today's Date: 03/06/2014 Time: 1130-1200 SLP Time Calculation (min) (ACUTE ONLY): 30 min  Past Medical History:  Past Medical History  Diagnosis Date  . Diabetes mellitus   . Liver disease   . Enlarged prostate   . Hernia   . Alcohol dependence   . Seizures   . Hypercholesterolemia   . Liver lesion   . Pulmonary nodules   . Cervical mass     biopsy 03/04/14  . Metastatic disease   . Anemia   . Malnutrition   . Esophageal varices   . Hydronephrosis   . Cholelithiasis    Past Surgical History:  Past Surgical History  Procedure Laterality Date  . Appendectomy     HPI:  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  Multiple imaging studies (CT neck, CT chest, US abdomen)  performed since admission with metastatic disease and unknown  primary. CA 19-9 elevated at 95.8, AFP tumor marker normal.  Oncology consult appreciated. Neck node biopsy completed  yesterday, results pending. Also with esophageal wall thickening  on imaging and EGD tentatively planned for today. Also with plans  for CT A/P with contrast today. Anemia: multifactorial,  iron-deficiency noted. No overt signs of GI bleeding. Hemoccult  status unknown but has been ordered. Continue to monitor.  Received 1 and 1/2 units of blood over night. SLP asked to  clinically evaluate swallow due to reports of solid food  dysphagia.     Assessment / Plan / Recommendation Clinical Impression  Dysphagia Diagnosis: Mild oral phase dysphagia;Moderate  pharyngeal phase dysphagia;Severe pharyngeal phase dysphagia Clinical impression: [name] presents with mild oral phase  dysphagia in setting of edentulous status with impaired  mastication and weak lingual manipulation resulting in prolonged  oral transit and residue post swallow; mod/severe sensorimotor  and anatomically altered pharyngeal phase dysphagia characterized  by mild delay in swallow initiation, decreased tongue base  retraction, decreased hyolaryngeal excursion, decreased  pharyngeal pressure and decreased airway protection  resulting in  significant residuals in pharynx post swallow in the valleculae,  pyriforms, and posterior pharyngeal wall. Residuals are more  pronounced with puree and semi solid textures, however liquids  are pooled in pyriforms which pt transiently aspirates (trace  amounts) post swallow. Penetration occurs during the swallow  (from residuals most likely) with liquids, however with cued  cough, pt is able to clear.  Alternating semi solids and liquids  was only marginally effective in reducing pharyngeal residue. Pt  did not overtly cough during trace aspiration down posterior  tracheal wall, however he had wet vocal qualiity and some throat  clearing. Honey-thick liquids were not assessed as it was felt  that it would only increase residuals. Pt has osteophytes along  the cervical and thoracic spine with a more pronounced  impingement into pharyngeal space at C4-5 which is where most  solid residuals are delayed/pooled. While this is obviously not  an acute finding, [name] has significant pharyngeal weakness  which makes it difficult to clear the residuals. The presence of  suspected mass was visualized and may also have deliterious  effects on pharyngeal swallow (particularly cricopharyngeal  function) also contributing to significant residuals. Pt is  judged to be at risk for all textures/consistencies, however a  "safest diet" can be recommended to avoid alternative sources of  nutrition. Recommend D1/puree and thin liquids; pt to cough after  each swallow and repeat swallow throughout meals. Recommendations  reviewed with pt and precautions posted. No family present.  Prognosis for improvement is poor given mass.    Treatment Recommendation  Therapy as outlined in treatment plan below    Diet Recommendation Dysphagia 1 (Puree);Thin liquid   Liquid Administration via: Cup Medication Administration: Crushed with puree Supervision: Patient able to self feed;Intermittent supervision  to cue for compensatory strategies Compensations: Slow rate;Multiple dry swallows after each  bite/sip;Follow solids with liquid;Hard cough after  swallow;Effortful swallow Postural Changes and/or Swallow Maneuvers: Seated upright 90  degrees;Upright 30-60 min after meal;Out of bed for meals    Other  Recommendations Recommended Consults: MBS Oral Care Recommendations: Oral care BID Other Recommendations: Clarify dietary restrictions   Follow  Up Recommendations  Home health SLP    Frequency and Duration min 1 x/week  1 week   Pertinent Vitals/Pain VSS    SLP Swallow Goals   Pt will demonstrate safe and efficient consumption of least  restrictive diet with use of strategies as needed.    General Date of Onset: 03/02/14 HPI: Mr. 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  Multiple imaging studies (CT neck, CT chest, US abdomen)  performed since admission with metastatic disease and unknown  primary. CA 19-9 elevated at 95.8, AFP tumor marker normal.  Oncology consult appreciated. Neck node biopsy completed  yesterday, results pending. Also with esophageal wall thickening  on imaging and EGD tentatively planned for today. Also with plans  for CT A/P with contrast today. Anemia: multifactorial,  iron-deficiency noted. No overt signs of GI bleeding. Hemoccult  status unknown but has been ordered. Continue to monitor.  Received 1 and 1/2 units of blood over night. SLP asked to  clinically evaluate swallow due to reports of solid food  dysphagia. Type of Study: Modified Barium Swallowing Study Reason for Referral: Objectively evaluate swallowing function Previous Swallow Assessment: 2010 Diet Prior to this Study:  Thin liquids (full liquids) Temperature Spikes Noted: No Respiratory Status: Room air History of Recent Intubation: No Behavior/Cognition: Alert;Cooperative Oral Cavity - Dentition: Edentulous Oral Motor / Sensory Function: Impaired - see Bedside swallow  eval Self-Feeding Abilities: Able to feed self Patient Positioning: Upright in chair Baseline Vocal Quality: Wet Volitional Cough: Congested;Weak Volitional Swallow: Able to elicit Anatomy: Other (Comment) (osteophytes; mass-like opaque shape  noted at level of ~C6) Pharyngeal Secretions: Not observed secondary MBS    Reason for Referral Objectively evaluate  swallowing function   Oral Phase Oral Preparation/Oral Phase Oral Phase: Impaired Oral - Solids Oral - Mechanical Soft: Impaired mastication;Weak lingual  manipulation;Reduced posterior propulsion;Delayed oral  transit;Lingual/palatal residue   Pharyngeal Phase Pharyngeal Phase Pharyngeal Phase: Impaired Pharyngeal - Nectar Pharyngeal - Nectar Cup: Premature spillage to pyriform  sinuses;Reduced pharyngeal peristalsis;Reduced epiglottic  inversion;Reduced anterior laryngeal mobility;Reduced laryngeal  elevation;Reduced airway/laryngeal closure;Reduced tongue base  retraction;Penetration/Aspiration after swallow;Trace  aspiration;Pharyngeal residue - valleculae;Pharyngeal residue -  pyriform sinuses;Pharyngeal residue - cp segment;Lateral channel  residue Penetration/Aspiration details (nectar cup): Material enters  airway, passes BELOW cords without attempt by patient to eject  out (silent aspiration);Material enters airway, passes BELOW  cords then ejected out Pharyngeal - Thin Pharyngeal - Thin Cup: Premature spillage to pyriform  sinuses;Premature spillage to valleculae;Reduced pharyngeal  peristalsis;Reduced epiglottic inversion;Reduced anterior  laryngeal mobility;Reduced laryngeal elevation;Reduced  airway/laryngeal closure;Reduced tongue base  retraction;Penetration/Aspiration after swallow;Trace  aspiration;Pharyngeal residue - valleculae;Pharyngeal residue -  pyriform sinuses;Pharyngeal residue - cp segment;Inter-arytenoid  space residue;Lateral channel residue Penetration/Aspiration details (thin cup): Material enters  airway, passes BELOW cords without attempt by patient to eject  out (silent aspiration);Material enters airway, passes BELOW  cords then ejected out Pharyngeal - Solids Pharyngeal - Puree: Premature spillage to valleculae;Reduced  pharyngeal peristalsis;Reduced epiglottic inversion;Reduced  anterior laryngeal mobility;Reduced laryngeal elevation;Reduced  airway/laryngeal closure;Reduced  tongue base  retraction;Pharyngeal residue - valleculae;Pharyngeal residue -  pyriform sinuses;Pharyngeal residue - posterior  pharnyx;Pharyngeal residue - cp segment Pharyngeal - Mechanical Soft: Premature spillage to  valleculae;Reduced anterior laryngeal mobility;Reduced epiglottic  inversion;Reduced pharyngeal peristalsis;Reduced laryngeal  elevation;Reduced airway/laryngeal closure;Reduced tongue base  retraction;Pharyngeal residue - valleculae;Pharyngeal residue -  pyriform sinuses;Pharyngeal residue - posterior  pharnyx;Pharyngeal residue - cp segment Pharyngeal - Pill: Not tested Pharyngeal Phase - Comment Pharyngeal Comment: Residuals more pronounced with puree and semi  solids; cue to cough after swallow was effective in removing  liquids to cords  Cervical Esophageal Phase    GO    Cervical Esophageal Phase Cervical Esophageal Phase: Impaired Cervical Esophageal Phase - Solids Puree: Prominent cricopharyngeal segment        Thank you,  Genene Churn, [place]  [place] 03/06/2014, 1:10 PM     Medications: I have reviewed the patient's current medications.   LOS: 5 days   Barney Drain 08/28/2013, 2:23 PM

## 2014-03-08 DIAGNOSIS — R221 Localized swelling, mass and lump, neck: Secondary | ICD-10-CM

## 2014-03-08 DIAGNOSIS — R160 Hepatomegaly, not elsewhere classified: Secondary | ICD-10-CM

## 2014-03-08 LAB — BASIC METABOLIC PANEL
ANION GAP: 12 (ref 5–15)
BUN: 5 mg/dL — ABNORMAL LOW (ref 6–23)
CALCIUM: 8.9 mg/dL (ref 8.4–10.5)
CO2: 25 meq/L (ref 19–32)
Chloride: 92 mEq/L — ABNORMAL LOW (ref 96–112)
Creatinine, Ser: 0.64 mg/dL (ref 0.50–1.35)
GFR calc Af Amer: >90 mL/min (ref >90)
Glucose, Bld: 268 mg/dL — ABNORMAL HIGH (ref 70–99)
Potassium: 5 mEq/L (ref 3.7–5.3)
SODIUM: 129 meq/L — AB (ref 137–147)

## 2014-03-08 LAB — GLUCOSE, CAPILLARY
GLUCOSE-CAPILLARY: 75 mg/dL (ref 70–99)
Glucose-Capillary: 255 mg/dL — ABNORMAL HIGH (ref 70–99)
Glucose-Capillary: 281 mg/dL — ABNORMAL HIGH (ref 70–99)
Glucose-Capillary: 109 mg/dL — ABNORMAL HIGH (ref 70–99)

## 2014-03-08 MED ORDER — BISACODYL 5 MG PO TBEC
10.0000 mg | DELAYED_RELEASE_TABLET | ORAL | Status: AC
  Administered 2014-03-08 (×2): 10 mg via ORAL
  Filled 2014-03-08 (×2): qty 2

## 2014-03-08 MED ORDER — NICOTINE POLACRILEX 2 MG MT GUM
2.0000 mg | CHEWING_GUM | OROMUCOSAL | Status: DC | PRN
  Filled 2014-03-08: qty 1

## 2014-03-08 MED ORDER — POLYETHYLENE GLYCOL 3350 17 G PO PACK
17.0000 g | PACK | ORAL | Status: AC
  Administered 2014-03-08 (×6): 17 g via ORAL
  Filled 2014-03-08 (×5): qty 1

## 2014-03-08 MED ORDER — INSULIN ASPART 100 UNIT/ML ~~LOC~~ SOLN
3.0000 [IU] | Freq: Once | SUBCUTANEOUS | Status: AC
  Administered 2014-03-08: 3 [IU] via SUBCUTANEOUS

## 2014-03-08 MED ORDER — INSULIN ASPART 100 UNIT/ML ~~LOC~~ SOLN: 5.0000 [IU] | Freq: Three times a day (TID) | SUBCUTANEOUS | Status: DC

## 2014-03-08 NOTE — Progress Notes (Signed)
PROGRESS NOTE  [name] PPI:[id number] DOB: [birth year] DOA: 03/02/2014 PCP: [name], MD  Assessment/Plan: 1. 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 Multiple liver masses, multiple pulmonary nodules, large cecal mass. Status post biopsy by IR of lymph node. Pathology revealed squamous cell carcinoma but primary unknown.  2. Large irregular cecal mass concerning for colonic malignancy. Colonoscopy planned 12/21. 3. Possible pneumonia, suspected to be aspiration. Continue clindamycin. Afebrile, no hypoxia. Appears clinically resolved. 4. Bilateral hydronephrosis. Consider urinary bladder distention. Foley catheter in place. Follow-up as an outpatient. 5. Severe dilatation of the pancreatic duct without common bile duct. Concern for obstruction radiographically. Appears asymptomatic. 6. Ulcerative esophagitis, esophageal varices per EGD. Continue PPI, Carafate. 7. Dysphagia, likely secondary to ulcerative esophagitis. Speech therapist recommended dysphagia 1 diet with thin liquids. 8. Diabetes mellitus type 2. Stable. Hemoglobin A1c 8.0. 9. Hyponatremia. Suspect secondary to saline infusion. 10. Multifactorial anemia, iron deficiency, chronic disease. Status post transfusion packed red blood cells. 11. Radiographic appearance of cirrhosis. Suspect related to alcohol. 12. Large right inguinal hernia without complicating features. 13. Tobacco dependence. Recommend cessation.   Overall appears clinically stable. Plan for colonoscopy 12/21 for tissue diagnosis.  CBC complete metabolic panel and lipase in the morning  Stop IV fluids. Consider nephrology consultation tomorrow.  Code Status: full code DVT prophylaxis: SCDs Family Communication: none  Disposition Plan: home  Murray Hodgkins, MD  [facility]  Pager [phone] If 7PM-7AM, please contact night-coverage at [email or web address], password [facility] 03/08/2014, 6:03 PM  LOS: 6 days    Consultants: Gastroenterology   Procedures:    Antibiotics:    HPI/Subjective: Overall feeling okay.  Objective: Filed Vitals:   03/08/14 0528 03/08/14 0814 03/08/14 1458 03/08/14 1538  BP: 144/96   129/75  Pulse: 92   86  Temp: 98.7 F (37.1 C)   97.2 F (36.2 C)  TempSrc: Oral   Oral  Resp: 20   20  Height:      Weight:      SpO2: 98% 97% 91% 97%    Intake/Output Summary (Last 24 hours) at 03/08/14 1803 Last data filed at 03/08/14 1500  Gross per 24 hour  Intake 1344.16 ml  Output   2100 ml  Net -755.84 ml     Filed Weights   03/02/14 2300  Weight: 52.4 kg (115 lb 8.3 oz)    Exam:     Afebrile, vital signs are stable. No hypoxia.   General: Appears calm and comfortable. Watching TV.  Psych: Alert. Speech fluent.  CV: Regular rate and rhythm. No murmur, rub or gallop.  Respiratory: Clear to auscultation bilaterally. No wheezes, rales or rhonchi. Normal respiratory effort.  Data Reviewed:  Capillary blood sugar stable.  CBG high, 281.  Sodium worse 129, chloride 92. Chloride no full declining.  No CBC today  Scheduled Meds: . clindamycin (CLEOCIN) IV  600 mg Intravenous Q8H  . feeding supplement (GLUCERNA SHAKE)  237 mL Oral TID BM  . folic acid  1 mg Oral Daily  . insulin aspart  0-9 Units Subcutaneous TID WC  . [START ON 03/09/2014] insulin aspart  5 Units Subcutaneous TID WC  . insulin detemir  5 Units Subcutaneous QHS  . levalbuterol  0.63 mg Nebulization TID  . lisinopril  5 mg Oral Daily  . multivitamin with minerals  1 tablet Oral Daily  . pantoprazole  40 mg Oral BID AC  . polyethylene glycol  17 g Oral Q1 Hr x 6  . thiamine  100 mg Oral Daily   Or  . thiamine  100 mg Intravenous Daily   Continuous Infusions: . 0.9 % NaCl with KCl 20 mEq / L 50 mL/hr at 03/06/14 0510    Principal Problem:   Metastatic disease Active Problems:   Seizure disorder   HTN (hypertension)   Aspiration pneumonia   Diabetes type 2,  uncontrolled   Hyperlipidemia   Dysphagia   Hypokalemia   Cervical lymphadenopathy   Tobacco abuse   Anemia   Cirrhosis   Hypomagnesemia   Neck mass   Liver mass   Severe malnutrition   Absolute anemia   Cholelithiasis   Esophageal varices   Hydronephrosis   Ulcerative esophagitis   Colonic mass   Time spent 20 minutes

## 2014-03-08 NOTE — Progress Notes (Signed)
Patient ID: NINA HOAR, male   DOB: [birth year], 63 y.o.   MRN: [id number]   Assessment/Plan: ADMITTED WITH MALAISE. LIKELY HAS 2 PRIMARY MALIGNANCIES. RESPIRATORY STATUS IMPROVED.Na 129-TRENDING DOWN IN SETTING OF MILD HYPERGLYCEMIA.  PLAN: 1. TCS DEC 21 TO OBTAIN TISSUE DIAGNOSIS OF COLON MASS 2. MIRALAX PREP TODAY/CLEAR LIQUIDS. DISCUSSED PROCEDURE, BENEFITS, & RISKS: < 1% chance of medication reaction, bleeding, perforation, or rupture of spleen/liver. 3. MAY HAVE A CLEAR LIQUID BREAKFAST. 4. NEBS Q6H. 5. HOLD NOVOLOG. CONTINUE LEVEMIR AND ISS.   Subjective: Since I last evaluated the patient he had MIRALAX q1hX3 and woke up [place]. SWALLOWING OK.  Objective: Vital signs in last 24 hours: Filed Vitals:   03/08/14 0528  BP: 144/96  Pulse: 92  Temp: 98.7 F (37.1 C)  Resp: 20    General appearance: alert, cooperative and no distress Resp: rales bilaterally, FAIR AIR MOVEMENT. ABLE TO COMPLETE FULL SENTENCES. Cardio: regular rate and rhythm GI: soft, non-tender; bowel sounds normal; no masses,   Lab Results:  NA 129 HEME NEGATIVE   Studies/Results: No results found.  Medications: I have reviewed the patient's current medications.   LOS: 5 days   Barney Drain 08/28/2013, 2:23 PM

## 2014-03-09 ENCOUNTER — Encounter (HOSPITAL_COMMUNITY)
Admission: EM | Disposition: A | Discharge status: Home Health | Payer: Self-pay | Source: Home / Self Care | Attending: Internal Medicine

## 2014-03-09 ENCOUNTER — Encounter (HOSPITAL_COMMUNITY): Payer: Self-pay | Admitting: *Deleted

## 2014-03-09 DIAGNOSIS — D509 Iron deficiency anemia, unspecified: Secondary | ICD-10-CM

## 2014-03-09 DIAGNOSIS — E8809 Other disorders of plasma-protein metabolism, not elsewhere classified: Secondary | ICD-10-CM

## 2014-03-09 DIAGNOSIS — R1909 Other intra-abdominal and pelvic swelling, mass and lump: Secondary | ICD-10-CM

## 2014-03-09 DIAGNOSIS — C182 Malignant neoplasm of ascending colon: Secondary | ICD-10-CM

## 2014-03-09 DIAGNOSIS — D72829 Elevated white blood cell count, unspecified: Secondary | ICD-10-CM

## 2014-03-09 DIAGNOSIS — E41 Nutritional marasmus: Secondary | ICD-10-CM

## 2014-03-09 DIAGNOSIS — K2970 Gastritis, unspecified, without bleeding: Secondary | ICD-10-CM

## 2014-03-09 DIAGNOSIS — C7989 Secondary malignant neoplasm of other specified sites: Secondary | ICD-10-CM

## 2014-03-09 HISTORY — PX: PEG PLACEMENT: SHX5437

## 2014-03-09 HISTORY — PX: COLONOSCOPY: SHX5424

## 2014-03-09 HISTORY — PX: ESOPHAGOGASTRODUODENOSCOPY: SHX5428

## 2014-03-09 LAB — COMPREHENSIVE METABOLIC PANEL
ALT: 6 U/L (ref 0–53)
AST: 12 U/L (ref 0–37)
Albumin: 2.5 g/dL — ABNORMAL LOW (ref 3.5–5.2)
Alkaline Phosphatase: 164 U/L — ABNORMAL HIGH (ref 39–117)
Anion gap: 13 (ref 5–15)
BUN: 4 mg/dL — ABNORMAL LOW (ref 6–23)
CALCIUM: 9 mg/dL (ref 8.4–10.5)
CHLORIDE: 96 meq/L (ref 96–112)
CO2: 25 mEq/L (ref 19–32)
CREATININE: 0.67 mg/dL (ref 0.50–1.35)
GFR calc Af Amer: >90 mL/min (ref >90)
GFR calc non Af Amer: >90 mL/min (ref >90)
Glucose, Bld: 100 mg/dL — ABNORMAL HIGH (ref 70–99)
Potassium: 4.8 mEq/L (ref 3.7–5.3)
SODIUM: 134 meq/L — AB (ref 137–147)
Total Bilirubin: 0.3 mg/dL (ref 0.3–1.2)
Total Protein: 7.3 g/dL (ref 6.0–8.3)

## 2014-03-09 LAB — CBC
HEMATOCRIT: 37.8 % — AB (ref 39.0–52.0)
Hemoglobin: 12.2 g/dL — ABNORMAL LOW (ref 13.0–17.0)
MCH: 23.1 pg — ABNORMAL LOW (ref 26.0–34.0)
MCHC: 32.3 g/dL (ref 30.0–36.0)
MCV: 71.6 fL — AB (ref 78.0–100.0)
Platelets: 409 10*3/uL — ABNORMAL HIGH (ref 150–400)
RBC: 5.28 MIL/uL (ref 4.22–5.81)
RDW: 18.2 % — ABNORMAL HIGH (ref 11.5–15.5)
WBC: 15.1 10*3/uL — AB (ref 4.0–10.5)

## 2014-03-09 LAB — GLUCOSE, CAPILLARY
GLUCOSE-CAPILLARY: 78 mg/dL (ref 70–99)
Glucose-Capillary: 116 mg/dL — ABNORMAL HIGH (ref 70–99)
Glucose-Capillary: 139 mg/dL — ABNORMAL HIGH (ref 70–99)
Glucose-Capillary: 123 mg/dL — ABNORMAL HIGH (ref 70–99)
Glucose-Capillary: 226 mg/dL — ABNORMAL HIGH (ref 70–99)

## 2014-03-09 SURGERY — INSERTION, PEG TUBE
Anesthesia: Moderate Sedation

## 2014-03-09 SURGERY — COLONOSCOPY
Anesthesia: Moderate Sedation

## 2014-03-09 MED ORDER — FLUMAZENIL 0.5 MG/5ML IV SOLN
INTRAVENOUS | Status: AC
  Administered 2014-03-09: 16:00:00
  Filled 2014-03-09: qty 5

## 2014-03-09 MED ORDER — STERILE WATER FOR IRRIGATION IR SOLN
Status: DC | PRN
  Administered 2014-03-09: 15:00:00

## 2014-03-09 MED ORDER — MEPERIDINE HCL 100 MG/ML IJ SOLN
INTRAMUSCULAR | Status: AC
  Filled 2014-03-09: qty 2

## 2014-03-09 MED ORDER — CEFAZOLIN SODIUM-DEXTROSE 2-3 GM-% IV SOLR
INTRAVENOUS | Status: AC
  Filled 2014-03-09: qty 50

## 2014-03-09 MED ORDER — MIDAZOLAM HCL 5 MG/5ML IJ SOLN
INTRAMUSCULAR | Status: AC
  Filled 2014-03-09: qty 10

## 2014-03-09 MED ORDER — MEPERIDINE HCL 100 MG/ML IJ SOLN
INTRAMUSCULAR | Status: DC | PRN
  Administered 2014-03-09 (×2): 25 mg via INTRAVENOUS

## 2014-03-09 MED ORDER — NICOTINE 21 MG/24HR TD PT24
21.0000 mg | MEDICATED_PATCH | Freq: Every day | TRANSDERMAL | Status: DC
  Administered 2014-03-09 – 2014-03-10 (×2): 21 mg via TRANSDERMAL
  Filled 2014-03-09 (×2): qty 1

## 2014-03-09 MED ORDER — MIDAZOLAM HCL 5 MG/5ML IJ SOLN
INTRAMUSCULAR | Status: DC | PRN
  Administered 2014-03-09 (×2): 1 mg via INTRAVENOUS

## 2014-03-09 MED ORDER — SODIUM CHLORIDE 0.9 % IV SOLN: INTRAVENOUS | Status: DC

## 2014-03-09 MED ORDER — LIDOCAINE VISCOUS 2 % MT SOLN
OROMUCOSAL | Status: AC
  Filled 2014-03-09: qty 15

## 2014-03-09 MED ORDER — CEFAZOLIN SODIUM-DEXTROSE 2-3 GM-% IV SOLR
2.0000 g | Freq: Once | INTRAVENOUS | Status: AC
  Administered 2014-03-09: 2 g via INTRAVENOUS

## 2014-03-09 MED ORDER — DEXTROSE-NACL 5-0.45 % IV SOLN
INTRAVENOUS | Status: AC
  Administered 2014-03-09: 18:00:00 via INTRAVENOUS

## 2014-03-09 MED ORDER — SODIUM CHLORIDE 0.9 % IV SOLN
INTRAVENOUS | Status: DC
  Administered 2014-03-09: 1000 mL via INTRAVENOUS

## 2014-03-09 NOTE — Op Note (Signed)
[facility] [address] [place], [address]   COLONOSCOPY PROCEDURE REPORT  PATIENT: [name], [name]  MR#: [id number] BIRTHDATE: 09-Apr-1950 , 33  yrs. old GENDER: male ENDOSCOPIST: Barney Drain, MD REFERRED QT:[id number] Bridget Hartshorn, M.D. PROCEDURE DATE:  Mar 17, 2014 PROCEDURE:   Colonoscopy with biopsy INDICATIONS:CECAL MASS. MEDICATIONS: Demerol 25 mg IV and Versed 1 mg IV  DESCRIPTION OF PROCEDURE:    Physical exam was performed.  Informed consent was obtained from the patient after explaining the benefits, risks, and alternatives to procedure.  The patient was connected to monitor and placed in left lateral position. Continuous oxygen was provided by nasal cannula and IV medicine administered through an indwelling cannula.  After administration of sedation and rectal exam, the patients rectum was intubated and the EC-[id number] ([id number])  colonoscope was advanced under direct visualization to the cecum.  The scope was removed slowly by carefully examining the color, texture, anatomy, and integrity mucosa on the way out.  The patient was recovered in endoscopy and discharged home in satisfactory condition.    COLON FINDINGS: NEAR OBSTRUCTING MASS IN THE CECUM BIOPSIESD VIA JUMBO COLD FORCEPS, A sessile polyp measuring 6 mm in size was found in the ascending colon.  , and Small internal hemorrhoids were found.  PREP QUALITY: good. CECAL W/D TIME: 14  minutes  COMPLICATIONS: None  ENDOSCOPIC IMPRESSION: 1.   NEAR OBSTRUCTING MASS IN THE CECUM 2.   ONE ASCENDING COLON polyp 3.   Small internal hemorrhoids  RECOMMENDATIONS: AWAIT BIOPSIES DYSPHAGIA 1 DIET      _______________________________ eSignedBarney Drain, MD 2014-03-17 11:10 PM    CPT CODES: ICD CODES:  The ICD and CPT codes recommended by this software are interpretations from the data that the clinical staff has captured with the software.  The verification of the translation of  this report to the ICD and CPT codes and modifiers is the sole responsibility of the health care institution and practicing physician where this report was generated.  [employer]. will not be held responsible for the validity of the ICD and CPT codes included on this report.  AMA assumes no liability for data contained or not contained herein. CPT is a Designer, television/film set of the [employer].

## 2014-03-09 NOTE — Op Note (Signed)
[place], [address]   EGD WITH PEG PROCEDURE REPORT        EXAM DATE: 03/09/2014  PATIENT NAME:          [name], [name]          MR #: [id number] BIRTHDATE:       1950/05/08     VISIT #:     [phone] ATTENDING:     Barney Drain, MD     STATUS:     outpatient ASSISTANT: NDICATIONS:  The patient is a 63 yr old male here for an EGD with PEG due to dysphagia. PROCEDURE PERFORMED:     EGD w/ percutaneous gastrostomy tube placement MEDICATIONS: TCS+TCS+      Versed 1 mg IV and Demerol 25 mg IV  + TCS, ANCEF 2 GMS IV TOPICAL ANESTHETIC:     Viscous Xylocaine  CONSENT: The patient understands the risks and benefits of the procedure and understands that these risks include, but are not limited to: sedation, allergic reaction, infection, perforation and/or bleeding. Alternative means of evaluation and treatment include, among others: physical exam, x-rays, and/or surgical intervention. The patient elects to proceed with this endoscopic procedure.  DESCRIPTION OF PROCEDURE: During intra-op preparation period all mechanical & medical equipment was checked for proper function. Hand hygiene and appropriate measures for infection prevention was taken. After the risks, benefits and alternatives of the procedure were thoroughly explained, Informed consent was verified, confirmed and timeout was successfully executed by the treatment team. The patient was anesthetized with topical anesthesia and the [id number] ([id number]) endoscope was introduced through the mouth and advanced to the BULB of the duodenum.  The instrument was slowly withdrawn as the mucosa was fully examined. ESOPHAGUS: MILD ESOPHAGITIS.   STOMACH: Moderate non-erosive gastritis (inflammation) was found in the entire examined stomach. Multiple biopsies were performed using cold forceps.   DUODENUM: The duodenal mucosa showed no abnormalities in the duodenal bulb. The stomach  was then inflated with air, and by a combination of transillumination and manual palpation, the site for the gastrostomy tube placement was selected and marked on the anterior abdominal wall.  The skin of the anterior abdomen was surgically prepped and draped with sterile towels.  Utilizing strict sterile technique, the selected site was then anesthetized with 1% xylocaine by injection into the skin and subcutaneous tissue.  A 1 cm incision was made through the skin and subcutaneous tissue, and the needle/cannula assembly was then passed through the abdominal wall and through the anterior wall of the stomach, maintaining visualization with the endoscope.  A snare device previously placed through the instrument channel was then opened and placed around the cannula, the needle was removed, and the insertion wire was passed through the cannula and into the stomach lumen.  The snare was then loosened from the cannula, and repositioned to snare the insertion wire.  The snare was then pulled up to the endoscope distal tip, and the scope was then withdrawn bringing with it the snare and insertion wire.  The insertion wire was then released from the snare, and then loop-attached to the Bard 20 Fr gastrostomy tube.  Using the "pull technique", the G-tube was then pulled into place by traction on the insertion wire at the abdominal wall end.        The G-tube insertion site was then cleansed once again, and the external bolster was placed over the tube to secure it to the abdominal wall.  A sterile dressing  was then applied, and the procedure terminated. The gastroscope was then slowly withdrawn and removed.    ADVERSE EVENT:     Hypoxemia occurred ON NASAL CANULA.  O2 SATS DROPPED TO 79%.  100% BVM THEN O2 SATS UP TO 100%.  100% NRB PLACED.  PT MAINTAINED SATS > 90%.  D/C TO FLR ON 3 Ls [place] AND O2 SATS 97%. IMPRESSIONS:     1.  MILD ESOPHAGITIS 2.  MODERATE NoN-EROSIVE  GASTRITIS  RECOMMENDATIONS:     MAY USE PEG FOR FEEDS AND MEDS RECORD TOP OF BUMPER QSHIFT(2.5-3 CM).    REPEAT EXAM:   ___________________________________ Barney Drain, MD eSigned:  Barney Drain, MD Mar 26, 2014 11:21 PM   cc:  CPT CODES: ICD CODES:  The ICD and CPT codes recommended by this software are interpretations from the data that the clinical staff has captured with the software.  The verification of the translation of this report to the ICD and CPT codes and modifiers is the sole responsibility of the health care institution and practicing physician where this report was generated.  [employer]. will not be held responsible for the validity of the ICD and CPT codes included on this report.  AMA assumes no liability for data contained or not contained herein. CPT is a Designer, television/film set of the [employer].  PATIENT NAME:  [name] MR#: [id number]

## 2014-03-09 NOTE — Interval H&P Note (Signed)
History and Physical Interval Note:  03/09/2014 2:54 PM  [name]  has presented today for surgery, with the diagnosis of CECAL MASS  The various methods of treatment have been discussed with the patient and family. After consideration of risks, benefits and other options for treatment, the patient has consented to  Procedure(s): COLONOSCOPY (N/A) ESOPHAGOGASTRODUODENOSCOPY (EGD) (N/A) PERCUTANEOUS ENDOSCOPIC GASTROSTOMY (PEG) PLACEMENT (N/A) as a surgical intervention .  The patient's history has been reviewed, patient examined, no change in status, stable for surgery.  I have reviewed the patient's chart and labs.  Questions were answered to the patient's satisfaction.     [employer]

## 2014-03-09 NOTE — Progress Notes (Signed)
NUTRITION FOLLOW UP  Pt meets criteria for severe MALNUTRITION in the context of chronic illness as evidenced by severe muscle wasting clavicles, patella, quadricep regions, and 15% weight loss within 7 months.   Intervention:   -Follow for nutrition plan of care -Continue Glucerna Shake po BID, each supplement provides 220 kcal and 10 grams of protein -If pt requires PEG tube- recommend: Initiate Jevity 1.2 @ 25 ml/hr via PEG and increase by 10 ml every 12 hours to goal rate of 65 ml/hr.  Tube feeding regimen will provide 1872 kcal (100% of needs), 87 grams of protein, and 1259 ml of H2O.  If no IVFs, recommend 65 ml flush every 6 hours to provide additional 260 ml fluid (total of 1519 ml fluid with formula) daily.   Nutrition Dx:   Inadequate oral intake related to dysphagia? as evidenced by severe wasting clavicles, patella, quadricept regions, 15% wt loss x 7 months; ongoing  Goal:  Pt to meet >/= 90% of their estimated nutrition needs   Monitor:   Protein-energy intake, labs and wt trends   Assessment:   Pt lives with his sister. He has swallow difficulty and limited solid food intake. hx of alcohol abuse, liver disease and diabetes. [place] of abdomen and EGD with dilation planned.   Pt was seen by SLP and underwent MBSS. Study revealed that pt is at risk for aspiration with all textures, but recommends safest diet of dysphagia 1 with thin liquids. Intake has been good, with 75-100% meal completion. He is also receiving Glucerna BID, which he is accepting well. Pt is scheduled for TCS for tissue diagnosis. Per MD notes, suspect multiple malignancies. Treatment of hospice vs chemotherapy bing considered. Oncology is recommending PEG tube.  Labs reviewed. Na: 134, BUN: 4, Glucose: 100. CBGS: [id number].  Height: Ht Readings from Last 1 Encounters:  03/02/14 6' (1.829 m)    Weight Status:   Wt Readings from Last 1 Encounters:  03/02/14 115 lb 8.3 oz (52.4 kg)    Re-estimated  needs:  Kcal: 1800-2080 ( to promote gradual wt gain) Protein: 75-85 gr Fluid: >1500 ml daily  Skin: Intact  Diet Order: Diet NPO time specified Except for: Sips with Meds   Intake/Output Summary (Last 24 hours) at 03/09/14 0920 Last data filed at 03/09/14 0717  Gross per 24 hour  Intake 488.33 ml  Output   2900 ml  Net -2411.67 ml    Last BM: 03/08/14   Labs:   Recent Labs Lab 03/03/14 0841  03/07/14 0557 03/08/14 0605 03/09/14 0606  NA  --   < > 132* 129* 134*  K  --   < > 4.6 5.0 4.8  CL  --   < > 95* 92* 96  CO2  --   < > 26 25 25   BUN  --   < > 4* 5* 4*  CREATININE  --   < > 0.65 0.64 0.67  CALCIUM  --   < > 8.5 8.9 9.0  MG 1.3*  --   --   --   --   GLUCOSE  --   < > 235* 268* 100*  < > = values in this interval not displayed.  CBG (last 3)   Recent Labs  03/08/14 1801 03/08/14 2307 03/09/14 0758  GLUCAP 281* 109* 116*    Scheduled Meds: . clindamycin (CLEOCIN) IV  600 mg Intravenous Q8H  . feeding supplement (GLUCERNA SHAKE)  237 mL Oral TID BM  . folic acid  1 mg Oral Daily  . insulin aspart  0-9 Units Subcutaneous TID WC  . insulin aspart  5 Units Subcutaneous TID WC  . insulin detemir  5 Units Subcutaneous QHS  . levalbuterol  0.63 mg Nebulization TID  . lisinopril  5 mg Oral Daily  . multivitamin with minerals  1 tablet Oral Daily  . pantoprazole  40 mg Oral BID AC  . thiamine  100 mg Oral Daily   Or  . thiamine  100 mg Intravenous Daily    Continuous Infusions:   Lema Heinkel A. Jimmye Norman, RD, LDN Pager: [phone]

## 2014-03-09 NOTE — Progress Notes (Signed)
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 Multiple liver masses, multiple pulmonary nodules, large cecal mass. Status post biopsy by IR of lymph node. Pathology revealed squamous cell carcinoma but primary unknown. Awaiting colonoscopy today for colonic mass. Evaluated by oncology who opine if 2 separate malignancies may benefit from Hospice rather than chemo.  Large irregular cecal mass concerning for colonic malignancy. Await Colonoscopy results. Appreciate GI assistance.  Possible pneumonia, suspected to be aspiration. Clindamycin day #5. He remains febrile, no hypoxia.   Bilateral hydronephrosis. Foley catheter in place. Follow-up as an outpatient.  Severe dilatation of the pancreatic duct without common bile duct. Concern for obstruction radiographically. remains asymptomatic.  Ulcerative esophagitis, esophageal varices per EGD. Continue PPI, Carafate. Patient requesting "candy"  Dysphagia, likely secondary to ulcerative esophagitis. Speech therapist recommended dysphagia 1 diet with thin liquids.  Diabetes mellitus type 2. Stable. Hemoglobin A1c 8.0.  Hyponatremia. Improved. Monitor. Patient NPO for colonoscopy  Multifactorial anemia, iron deficiency, chronic disease. Hg stable s/p transfusion packed red blood cells.  Radiographic appearance of cirrhosis. Suspect related to alcohol.  Large right inguinal hernia without complicating features.  Tobacco dependence. Recommend cessation. Requesting nicotine patch  Code Status: full Family Communication: none present Disposition Plan: home when ready   Consultants:  Gastroenterology  Oncology   Procedures:  EGD per Dr. Gala Romney 03/05/14: ENDOSCOPIC IMPRESSION: Ulcerative/erosive reflux esophagitis?"extensive superimposed on nonbleeding esophageal  varices grade 2. Hiatal hernia. Abnormal gastric mucosa?"status post gastric biopsy . Patent esophagus.   IR biopsy neck mass/node 03/04/14.  Antibiotics:  Clindamycin 03/05/14>   03/03/14>> 03/05/14  Vancomycin 03/03/14>> 03/05/14  HPI/Subjective: Sitting up in bed. Denies pain/discomfort. Requesting "candy" or "smoke"  Objective: Filed Vitals:   03/09/14 0708  BP: 124/70  Pulse: 90  Temp: 99 F (37.2 C)  Resp: 20    Intake/Output Summary (Last 24 hours) at 03/09/14 1320 Last data filed at 03/09/14 1024  Gross per 24 hour  Intake 688.33 ml  Output   2000 ml  Net -1311.67 ml   Filed Weights   03/02/14 2300  Weight: 52.4 kg (115 lb 8.3 oz)    Exam:   General:  Thin frail  Cardiovascular: RRR No MGR No LE edema  Respiratory: Normal effort BS somewhat distant. Fine bibasilar crackles  Abdomen: non-distended +BS non-tender to palpation  Musculoskeletal: no clubbing or cyanosis   Data Reviewed: Basic Metabolic Panel:  Recent Labs Lab 03/03/14 0841  03/05/14 1057 03/06/14 0610 03/07/14 0557 03/08/14 0605 03/09/14 0606  NA  --   < > 135* 134* 132* 129* 134*  K  --   < > 5.1 4.2 4.6 5.0 4.8  CL  --   < > 97 97 95* 92* 96  CO2  --   < > 29 27 26 25 25   GLUCOSE  --   < > 246* 135* 235* 268* 100*  BUN  --   < > 6 5* 4* 5* 4*  CREATININE  --   < > 0.72 0.70 0.65 0.64 0.67  CALCIUM  --   < > 8.7 8.5 8.5 8.9 9.0  MG 1.3*  --   --   --   --   --   --   < > = values in this interval not displayed. Liver Function Tests:  Recent Labs Lab 03/03/14 0549 03/09/14 0606  AST 13 12  ALT 6  6  ALKPHOS 146* 164*  BILITOT 0.3 0.3  PROT 6.1 7.3  ALBUMIN 2.1* 2.5*   No results for input(s): LIPASE, AMYLASE in the last 168 hours. No results for input(s): AMMONIA in the last 168 hours. CBC:  Recent Labs Lab 03/02/14 2023  03/04/14 0609 03/05/14 1057 03/06/14 0610 03/07/14 0557 03/09/14 0606  WBC 14.0*  < > 10.4 14.9* 17.5* 14.1* 15.1*  NEUTROABS  11.4*  --   --   --   --   --   --   HGB 9.6*  < > 7.9* 12.0* 11.0* 11.2* 12.2*  HCT 30.5*  < > 24.9* 36.9* 33.0* 34.7* 37.8*  MCV 68.2*  < > 69.2* 72.8* 71.6* 71.7* 71.6*  PLT 379  < > 355 324 335 369 409*  < > = values in this interval not displayed. Cardiac Enzymes: No results for input(s): CKTOTAL, CKMB, CKMBINDEX, TROPONINI in the last 168 hours. BNP (last 3 results) No results for input(s): PROBNP in the last 8760 hours. CBG:  Recent Labs Lab 03/08/14 1138 03/08/14 1801 03/08/14 2307 03/09/14 0758 03/09/14 1140  GLUCAP 75 281* 109* 116* 139*    Recent Results (from the past 240 hour(s))  Culture, blood (routine x 2) Call MD if unable to obtain prior to antibiotics being given     Status: None   Collection Time: 03/02/14 11:23 PM  Result Value Ref Range Status   Specimen Description BLOOD RIGHT ANTECUBITAL  Final   Special Requests BOTTLES DRAWN AEROBIC AND ANAEROBIC [place]  Final   Culture NO GROWTH 5 DAYS  Final   Report Status 03/07/2014 FINAL  Final  Culture, blood (routine x 2) Call MD if unable to obtain prior to antibiotics being given     Status: None   Collection Time: 03/02/14 11:23 PM  Result Value Ref Range Status   Specimen Description BLOOD RIGHT ANTECUBITAL  Final   Special Requests   Final    BOTTLES DRAWN AEROBIC AND ANAEROBIC AEB=6CC ANA=4CC   Culture  Setup Time   Final    03/04/2014 14:50 Performed at [facility]    Culture   Final    STAPHYLOCOCCUS SPECIES (COAGULASE NEGATIVE) Note: THE SIGNIFICANCE OF ISOLATING THIS ORGANISM FROM A SINGLE VENIPUNCTURE CANNOT BE PREDICTED WITHOUT FURTHER CLINICAL AND CULTURE CORRELATION. SUSCEPTIBILITIES AVAILABLE ONLY ON REQUEST. Note: Gram Stain Report Called to,Read Back By and Verified With: THOMAS [id number]  ON 03/03/14 BY FORSYTHK Performed at [facility] Performed at [facility]    Report Status 03/05/2014 FINAL  Final  MRSA PCR Screening     Status: None   Collection Time:  03/03/14 12:10 AM  Result Value Ref Range Status   MRSA by PCR NEGATIVE NEGATIVE Final    Comment:        The GeneXpert MRSA Assay (FDA approved for NASAL specimens only), is one component of a comprehensive MRSA colonization surveillance program. It is not intended to diagnose MRSA infection nor to guide or monitor treatment for MRSA infections.   Clostridium Difficile by PCR     Status: None   Collection Time: 03/06/14  3:20 PM  Result Value Ref Range Status   C difficile by pcr NEGATIVE NEGATIVE Final     Studies: No results found.  Scheduled Meds: . clindamycin (CLEOCIN) IV  600 mg Intravenous Q8H  . feeding supplement (GLUCERNA SHAKE)  237 mL Oral TID BM  . folic acid  1 mg Oral Daily  . insulin aspart  0-9 Units  Subcutaneous TID WC  . insulin aspart  5 Units Subcutaneous TID WC  . insulin detemir  5 Units Subcutaneous QHS  . levalbuterol  0.63 mg Nebulization TID  . lisinopril  5 mg Oral Daily  . multivitamin with minerals  1 tablet Oral Daily  . pantoprazole  40 mg Oral BID AC  . thiamine  100 mg Oral Daily   Or  . thiamine  100 mg Intravenous Daily   Continuous Infusions:   Principal Problem:   Metastatic disease Active Problems:   Seizure disorder   HTN (hypertension)   Aspiration pneumonia   Diabetes type 2, uncontrolled   Hyperlipidemia   Dysphagia   Hypokalemia   Cervical lymphadenopathy   Tobacco abuse   Anemia   Cirrhosis   Hypomagnesemia   Neck mass   Liver mass   Severe malnutrition   Absolute anemia   Cholelithiasis   Esophageal varices   Hydronephrosis   Ulcerative esophagitis   Colonic mass    Time spent: 35 minutes    [facility]  7PM-7AM, please contact night-coverage at [email or web address], password [facility] 03/09/2014, 1:20 PM  LOS: 7 days

## 2014-03-09 NOTE — Progress Notes (Signed)
Patient's CBG 78, Dr Sarajane Jews notified,orders received,and given. Will continue to monitor patient.

## 2014-03-09 NOTE — H&P (View-Only) (Signed)
Patient ID: [name], male   DOB: 1950/11/23, 63 y.o.   MRN: [id number]   Assessment/Plan: ADMITTED WITH MALAISE. LIKELY HAS 2 PRIMARY MALIGNANCIES. RESPIRATORY STATUS IMPROVED.Na 129-TRENDING DOWN IN SETTING OF MILD HYPERGLYCEMIA.  PLAN: 1. TCS DEC 21 TO OBTAIN TISSUE DIAGNOSIS OF COLON MASS 2. MIRALAX PREP TODAY/CLEAR LIQUIDS. DISCUSSED PROCEDURE, BENEFITS, & RISKS: < 1% chance of medication reaction, bleeding, perforation, or rupture of spleen/liver. 3. MAY HAVE A CLEAR LIQUID BREAKFAST. 4. NEBS Q6H. 5. HOLD NOVOLOG. CONTINUE LEVEMIR AND ISS.   Subjective: Since I last evaluated the patient he had MIRALAX q1hX3 and woke up [place]. SWALLOWING OK.  Objective: Vital signs in last 24 hours: Filed Vitals:   03/08/14 0528  BP: 144/96  Pulse: 92  Temp: 98.7 F (37.1 C)  Resp: 20    General appearance: alert, cooperative and no distress Resp: rales bilaterally, FAIR AIR MOVEMENT. ABLE TO COMPLETE FULL SENTENCES. Cardio: regular rate and rhythm GI: soft, non-tender; bowel sounds normal; no masses,   Lab Results:  NA 129 HEME NEGATIVE   Studies/Results: No results found.  Medications: I have reviewed the patient's current medications.   LOS: 5 days   Barney Drain 08/28/2013, 2:23 PM

## 2014-03-09 NOTE — Progress Notes (Signed)
Subjective: Patient seen in bed.  He is comfortable.  Difficult to understand.  He reports mouth/tooth pain.  He has Oxy IR ordered.  He is informed that he has to let the nurse know when he has pain so medication can be given.  I spoke with his nurse and she will get him a pain pill.  Objective: Vital signs in last 24 hours: Temp:  [97.2 F (36.2 C)-99 F (37.2 C)] 99 F (37.2 C) (12/21 0708) Pulse Rate:  [86-90] 90 (12/21 0708) Resp:  [20] 20 (12/21 0708) BP: (113-129)/(70-75) 124/70 mmHg (12/21 0708) SpO2:  [91 %-100 %] 98 % (12/21 0725)  Intake/Output from previous day: 12/20 0800 - 12/21 0759 In: 488.3 [I.V.:488.3] Out: 2900 [Urine:2900] Intake/Output this shift:    General appearance: alert, cooperative, appears older than stated age, no distress and difficult to understand Resp: clear to auscultation bilaterally Extremities: extremities normal, atraumatic, no cyanosis or edema  Lab Results:   Recent Labs  03/07/14 0557 03/09/14 0606  WBC 14.1* 15.1*  HGB 11.2* 12.2*  HCT 34.7* 37.8*  PLT 369 409*   BMET  Recent Labs  03/08/14 0605 03/09/14 0606  NA 129* 134*  K 5.0 4.8  CL 92* 96  CO2 25 25  GLUCOSE 268* 100*  BUN 5* 4*  CREATININE 0.64 0.67  CALCIUM 8.9 9.0    Studies/Results: No results found.  Medications: I have reviewed the patient's current medications.  Assessment/Plan: 1. Squamous cell carcinoma of neck mass. 2. Colonic mass, undergoing TCS today with biopsy by Dr. Oneida Alar.  3. Hypoalbuminemia 4. Aspiration 5. Leukocytosis 6. Normocytic, normochromic anemia 7. If patient has two separate malignancies, treatment decision will need to be considered, and he may benefit from Hospice rather than chemotherapy.  I have requested GI to consider PEG tube for palliative reasons.   Patient and plan discussed with Dr. Ancil Linsey and she is in agreement with the aforementioned.     LOS: 7 days    [name] 03/09/2014

## 2014-03-10 ENCOUNTER — Encounter: Payer: Self-pay | Admitting: Nurse Practitioner

## 2014-03-10 LAB — BASIC METABOLIC PANEL
ANION GAP: 6 (ref 5–15)
BUN: 5 mg/dL — ABNORMAL LOW (ref 6–23)
CALCIUM: 8.5 mg/dL (ref 8.4–10.5)
CHLORIDE: 94 meq/L — AB (ref 96–112)
CO2: 29 mmol/L (ref 19–32)
Creatinine, Ser: 0.64 mg/dL (ref 0.50–1.35)
GFR calc Af Amer: >90 mL/min (ref >90)
GFR calc non Af Amer: >90 mL/min (ref >90)
GLUCOSE: 270 mg/dL — AB (ref 70–99)
Potassium: 4.6 mmol/L (ref 3.5–5.1)
Sodium: 129 mmol/L — ABNORMAL LOW (ref 135–145)

## 2014-03-10 LAB — CBC
HCT: 37.2 % — ABNORMAL LOW (ref 39.0–52.0)
HEMOGLOBIN: 12 g/dL — AB (ref 13.0–17.0)
MCH: 23.2 pg — AB (ref 26.0–34.0)
MCHC: 32.3 g/dL (ref 30.0–36.0)
MCV: 71.8 fL — ABNORMAL LOW (ref 78.0–100.0)
PLATELETS: 408 10*3/uL — AB (ref 150–400)
RBC: 5.18 MIL/uL (ref 4.22–5.81)
RDW: 18 % — ABNORMAL HIGH (ref 11.5–15.5)
WBC: 13.2 10*3/uL — AB (ref 4.0–10.5)

## 2014-03-10 LAB — GI PATHOGEN PANEL BY PCR, STOOL
C difficile toxin A/B: NEGATIVE
CAMPYLOBACTER BY PCR: NEGATIVE
CRYPTOSPORIDIUM BY PCR: NEGATIVE
E COLI (ETEC) LT/ST: NEGATIVE
E coli (STEC): NEGATIVE
E coli 0157 by PCR: NEGATIVE
G lamblia by PCR: NEGATIVE
NOROVIRUS G1/G2: NEGATIVE
Rotavirus A by PCR: NEGATIVE
Salmonella by PCR: NEGATIVE
Shigella by PCR: NEGATIVE

## 2014-03-10 LAB — GLUCOSE, CAPILLARY
GLUCOSE-CAPILLARY: 261 mg/dL — AB (ref 70–99)
Glucose-Capillary: 187 mg/dL — ABNORMAL HIGH (ref 70–99)

## 2014-03-10 MED ORDER — FOLIC ACID 1 MG PO TABS: 1.0000 mg | ORAL_TABLET | Freq: Every day | ORAL | Status: AC

## 2014-03-10 MED ORDER — NICOTINE POLACRILEX 2 MG MT GUM: 2.0000 mg | CHEWING_GUM | OROMUCOSAL | Status: DC | PRN

## 2014-03-10 MED ORDER — INSULIN DETEMIR 100 UNIT/ML ~~LOC~~ SOLN
5.0000 [IU] | Freq: Every day | SUBCUTANEOUS | Status: DC
  Filled 2014-03-10: qty 0.05

## 2014-03-10 MED ORDER — OXYCODONE HCL 5 MG PO TABS: 5.0000 mg | ORAL_TABLET | ORAL | Status: DC | PRN

## 2014-03-10 MED ORDER — LEVALBUTEROL HCL 0.63 MG/3ML IN NEBU: 0.6300 mg | INHALATION_SOLUTION | Freq: Four times a day (QID) | RESPIRATORY_TRACT | Status: DC | PRN

## 2014-03-10 MED ORDER — PANTOPRAZOLE SODIUM 40 MG PO TBEC: 40.0000 mg | DELAYED_RELEASE_TABLET | Freq: Two times a day (BID) | ORAL | Status: DC

## 2014-03-10 MED ORDER — CLINDAMYCIN HCL 300 MG PO CAPS: 300.0000 mg | ORAL_CAPSULE | Freq: Three times a day (TID) | ORAL | Status: DC

## 2014-03-10 MED ORDER — INSULIN DETEMIR 100 UNIT/ML ~~LOC~~ SOLN: 5.0000 [IU] | Freq: Every day | SUBCUTANEOUS | Status: DC

## 2014-03-10 MED ORDER — LEVALBUTEROL HCL 0.63 MG/3ML IN NEBU: 0.6300 mg | INHALATION_SOLUTION | Freq: Three times a day (TID) | RESPIRATORY_TRACT | Status: DC

## 2014-03-10 MED ORDER — CLINDAMYCIN HCL 150 MG PO CAPS
300.0000 mg | ORAL_CAPSULE | Freq: Three times a day (TID) | ORAL | Status: DC
  Administered 2014-03-10: 300 mg via ORAL
  Filled 2014-03-10: qty 2

## 2014-03-10 MED ORDER — PANTOPRAZOLE SODIUM 40 MG PO TBEC: 40.0000 mg | DELAYED_RELEASE_TABLET | Freq: Two times a day (BID) | ORAL | Status: AC

## 2014-03-10 MED ORDER — FOLIC ACID 1 MG PO TABS: 1.0000 mg | ORAL_TABLET | Freq: Every day | ORAL | Status: DC

## 2014-03-10 MED ORDER — GLUCERNA SHAKE PO LIQD
237.0000 mL | Freq: Three times a day (TID) | ORAL | Status: DC
Start: 2014-03-10 — End: 2014-03-10

## 2014-03-10 MED ORDER — FERROUS SULFATE 325 (65 FE) MG PO TABS: 650.0000 mg | ORAL_TABLET | Freq: Two times a day (BID) | ORAL | Status: AC

## 2014-03-10 MED ORDER — NICOTINE 21 MG/24HR TD PT24: 21.0000 mg | MEDICATED_PATCH | Freq: Every day | TRANSDERMAL | Status: DC

## 2014-03-10 MED ORDER — NICOTINE POLACRILEX 2 MG MT GUM: 2.0000 mg | CHEWING_GUM | OROMUCOSAL | Status: AC | PRN

## 2014-03-10 MED ORDER — LEVALBUTEROL HCL 0.63 MG/3ML IN NEBU
0.6300 mg | INHALATION_SOLUTION | Freq: Four times a day (QID) | RESPIRATORY_TRACT | Status: DC | PRN
Start: 2014-03-10 — End: 2014-03-20

## 2014-03-10 MED ORDER — LEVALBUTEROL HCL 0.63 MG/3ML IN NEBU: 0.6300 mg | INHALATION_SOLUTION | Freq: Three times a day (TID) | RESPIRATORY_TRACT | Status: AC

## 2014-03-10 NOTE — Progress Notes (Signed)
UR chart review completed.  

## 2014-03-10 NOTE — Progress Notes (Signed)
Patient discharged home with instructions given on medications,and follow up visits,family,and patient verbalized understanding. [place] is also to follow up with patient. Patient being discharged with foley catheter,family educated on foley care,verbalized understanding.Prescriptions sent to Pharmacy of choice. Accompanied by staff to an awaiting vehicle.

## 2014-03-10 NOTE — Evaluation (Signed)
Physical Therapy Evaluation Patient Details Name: [name] MRN: [id number] DOB: 1950-06-05 Today's Date: 03/10/2014   History of Present Illness  Pt is a 63 year old male who reports a Cough and SOB ongoing for 2 wks. Associated w/ nausia. Denies fevers, CP, palpitaitons, syncope, HA diarrhea. Intermittent rinorrhea. Seen in the ED on 02/28/14 and prescribed Levaquin for possible Pneumonia. Friend was supposed to pick up medicine for pt but did not. . Friend witnessed pt apparently choking on food and called EMS. No loss of consciousness. Pt reports neck fullness and difficulty swallowing food from time to time. Pt reports being homeless. Pt primarily lives off of [address]  Currently pt reports living with his sister.  Clinical Impression   We are asked to reevaluate pt today since it has been nearly a week since last evaluation.  The concern is that he has had minimal activity since then and may perhaps be weaker.  He was, in fact, found to be significantly weaker since last evaluation.  He did undergo colonoscopy and PEG tube placement yesterday.  He has no pain today.  He now needs a walker for stable gait and even with a walker needs assist for its' proper use.  He did have some difficulty following directions and had problems with motor planning.  It may be presumed to be a function of brain metastasis.  He did have labored breathing during treatment but O2 sat on room air was 95%.  He was exhausted by the end of my visit and had done much less that when seen last week.  I am recommending that he have HHPT in an attempt to increase strength.    Follow Up Recommendations Home health PT    Equipment Recommendations  Rolling walker with 5" wheels;Hospital bed (pt states that he has a walker but this may not be reliable.)    Recommendations for Other Services   none    Precautions / Restrictions Precautions Precautions: Fall Restrictions Weight Bearing Restrictions:  No      Mobility  Bed Mobility Overal bed mobility: Modified Independent       Supine to sit: HOB elevated     General bed mobility comments: pt has labored breathing and might benefit from having a hospital bed at home  Transfers Overall transfer level: Modified independent Equipment used: Straight cane;Rolling walker (2 wheeled)                Ambulation/Gait Ambulation/Gait assistance: Min assist Ambulation Distance (Feet): 170 Feet Assistive device: Rolling walker (2 wheeled);Straight cane Gait Pattern/deviations: Decreased dorsiflexion - left;Decreased dorsiflexion - right;Step-through pattern;Trunk flexed   Gait velocity interpretation: Below normal speed for age/gender General Gait Details: gait with a cane was unstable so pt was instructed in gait with a rolling walker...this didi stabilize his gait but his motor planning skills are poor and he needed assistance to turn safely as well as needing assistance to guide the walker ( he would walk into a wall if allowed to do so)  Stairs Stairs:  (stairs not tested due to pt fatigue)          Wheelchair Mobility    Modified Rankin (Stroke Patients Only)       Balance Overall balance assessment: No apparent balance deficits (not formally assessed)  Pertinent Vitals/Pain Pain Assessment: No/denies pain    Home Living Family/patient expects to be discharged to:: Private residence Living Arrangements: Other relatives Available Help at Discharge: Family;Available PRN/intermittently   Home Access: Stairs to enter Entrance Stairs-Rails: Can reach both Entrance Stairs-Number of Steps: 4 Home Layout: One level Home Equipment: Cane - single point;Walker - 2 wheels Additional Comments: No bathroom in home, pt reports he uses the bathroom in the woods (per sister the bathroom is detached).     Prior Function Level of Independence: Independent with  assistive device(s)               Hand Dominance   Dominant Hand: Right    Extremity/Trunk Assessment               Lower Extremity Assessment: Generalized weakness      Cervical / Trunk Assessment: Kyphotic  Communication   Communication: Expressive difficulties (very slurred speech)  Cognition Arousal/Alertness: Awake/alert Behavior During Therapy: WFL for tasks assessed/performed Overall Cognitive Status: Within Functional Limits for tasks assessed                      General Comments      Exercises        Assessment/Plan    PT Assessment All further PT needs can be met in the next venue of care  PT Diagnosis Difficulty walking;Generalized weakness   PT Problem List Decreased strength;Decreased activity tolerance;Decreased mobility  PT Treatment Interventions     PT Goals (Current goals can be found in the Care Plan section) Acute Rehab PT Goals PT Goal Formulation: All assessment and education complete, DC therapy    Frequency     Barriers to discharge  2 steps at entrance      Co-evaluation               End of Session Equipment Utilized During Treatment: Gait belt Activity Tolerance: Patient limited by fatigue Patient left: in bed;with call bell/phone within reach;with bed alarm set Nurse Communication: Mobility status         Time: 9826-4158 PT Time Calculation (min) (ACUTE ONLY): 28 min   Charges:   PT Evaluation $PT Re-evaluation: 1 Procedure PT Treatments $Gait Training: 8-22 mins   PT G CodesSable Feil 03/10/2014, 10:54 AM

## 2014-03-10 NOTE — Discharge Summary (Signed)
Physician Discharge Summary  [name] CNO:[id number] DOB: 1950-11-05 DOA: 03/02/2014  PCP: Jim Regal, MD  Admit date: 03/02/2014 Discharge date: 03/10/2014  Time spent: 55 minutes  Recommendations for Outpatient Follow-up:  1. Follow up appointment [place] [facility] 03/26/13 2. [facility] will contact patient for follow up to hydronephrosis/chronic foley 3. Follow up with GI 04/08/13 follow up on colonic mass/esophagitis/radiographic appearance of cirrhosis and severe dilatation of the pancreatic duct without common bile duct.  4. Home health RN, PT and Social work.  5. PCP for evaluation of DM control and monitoring of Hg  Follow-up Information    Follow up with Jim Hazard, MD On 03/26/2014.   Specialties:  Internal Medicine, Oncology   Why:  9:30 AM appointment.  Arrive at 9 AM.   Contact information:   [place] [address] [phone]       Follow up with Jim Pila, NP On 04/08/2014.   Specialty:  Nurse Practitioner   Why:  appointment at 9 am   Contact information:   [address] [place] [address] [phone]       Follow up with Jim Regal, MD. Schedule an appointment as soon as possible for a visit in 1 week.   Specialty:  Family Medicine   Why:  follow up on DM control and track Hg   Contact information:   [address] [place] [address] [phone]       Follow up with Jim Ferris, NP.   Specialty:  Geriatric Medicine   Why:  office will call for appointment ASAP   Contact information:   [place] [address] [phone]        Discharge Diagnoses:  1. 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 2. Large cecal mass concerning for primary colonic malignancy. 3. Possible aspiration pneumonia. 4. Dysphagia. 5. Bilateral hydronephrosis with acute urinary retention likely  secondary to underlying malignancy. 6. Severe dilatation of the pancreatic duct, asymptomatic 7. Ulcerative esophagitis 8. 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 Diabetes mellitus type 2 10. 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 Iron deficiency anemia. 12. Tobacco dependence  Discharge Condition: fair. Ambulating with physical therapy. Clinically stable. However given his severe burden of disease and multiple issues associated with this he is at high risk for rehospitalization. Depending on results of workup hospice could be considered in the near future.  Diet recommendation: dysphagia 1 thin  Filed Weights   03/02/14 2300  Weight: 52.4 kg (115 lb 8.3 oz)    History of present illness:  63 year old male presented with history of getting choked on food. Initial evaluation revealed pneumonia with concerning imaging suggesting possible malignancy. He was admitted for treatment of pneumonia, further workup to exclude malignancy and evaluation by GI for dysphagia.Marland Kitchen    Hospital Course:  [name] was treated empirically for pneumonia. Further imaging was conducted. CT of the chest revealed multiple pulmonary nodules concerning for metastatic disease to the liver as well as diffuse thickening of the esophagus. CT of the neck revealed advanced malignancy in the neck. 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 Underwent EGD 12/17 which showed ulcerative/erosive reflux esophagitis and nonbleeding esophageal varices grade 2. Seen by speech therapy underwent modified barium swallow with recommendations for dysphagia 1 diet with thin liquids. Underwent CT  of the abdomen and pelvis to complete staging which revealed large colonic mass. Underwent colonoscopy 12/21 which revealed near obstructing mass in the cecum. PEG tube was placed for nutritional support as recommended by  oncology.  1. 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 Multiple liver masses, multiple pulmonary nodules, large cecal mass. Status post biopsy by IR of lymph node. Pathology revealed squamous cell carcinoma but primary unknown. Colonoscopy 03/09/14 for colonic mass. Biopsy results pending. EGD on 03/09/14 with insertion gastric tube. Evaluated by oncology who opine if 2 separate malignancies may benefit from Hospice rather than chemo.Has follow up appointment with oncology 03/26/13.  2. Large irregular cecal mass concerning for colonic malignancy. GI and Oncology to follow Colonoscopy results. See #1. Follow up with GI 04/08/13. 3. Possible pneumonia, suspected to be aspiration given swallow eval results. Received IV antibiotics 7 days. Will discharge with 1 more day clindamycin.  He remained febrile, no hypoxia.  4. Bilateral hydronephrosis. Foley catheter in place. Follow-up as an outpatient. [facility] to call and schedule appointmetn 5. Severe dilatation of the pancreatic duct without common bile duct. Concern for obstruction radiographically. Remained asymptomatic. OP follow up with GI 6. Ulcerative esophagitis, esophageal varices per EGD. Being discharged with  PPI.  7. Dysphagia, likely secondary to ulcerative esophagitis. Speech therapist recommended dysphagia 1 diet with thin liquids. Nutrition has recommended bolus tube feeds for supplementation. 8. Diabetes mellitus type 2.  Hemoglobin A1c 8.0. PO intake unreliable. Had 2 episodes hypoglycemia and CBG trended up with eating. Prior to admission had been prescribed insulin which his sister administered but he had not had any for several months.  Will discharge on home oral agents only.  OP follow up.  9. Hyponatremia. Likely chronic and related to above. Stable at what appears to be baseline. 10. Multifactorial anemia, iron deficiency, chronic disease. Hg stable s/p transfusion packed red blood cells. 11. Radiographic  appearance of cirrhosis. Suspect related to alcohol. OP follow up with GI 12. Large right inguinal hernia without complicating features. 13. Tobacco dependence. Recommend cessation. Requesting nicotine patch  Overall the patient appears to be stable, inpatient workup is complete and laboratory abnormalities are stable. Mental status is stable. Colonic biopsy is still pending. PEG tube has been placed for nutrition support, start with bolus feeds to supplement. He has outpatient follow-up arranged with the cancer center to review treatment options for what is suspected to be 2 primary malignancies. Possible pneumonia was treated with clindamycin he remains afebrile and without hypoxia. He will complete a short course of antibiotics as an outpatient. Foley catheter is draining well and will remain in place until he has follow-up in the outpatient setting. We will leave in place to prevent recurrent obstruction. GI has commented upon the patient's dilatation of the pancreatic duct and they plan to follow this clinically and make further recommendations based on treatment plan per oncology. He has no signs or symptoms of obstruction. Plan continue PPI for ulcerative esophagitis and dysphagia. His diabetes has been stable. He reports being off insulin for some time now. His blood sugars have been fairly well controlled. At this point will not make any changes to his outpatient medication regimen. He recently had hypoglycemia but now that he will be receiving bolus tube feeds doubt this will recur. He may need insulin in the future but this can be followed in the outpatient setting. His hyponatremia appears to be stable and asymptomatic, secondary to underlying malignancy.  Procedures:  EGD per Dr. Gala Romney  03/05/14: ENDOSCOPIC IMPRESSION: Ulcerative/erosive reflux esophagitis?"extensive superimposed on nonbleeding esophageal varices grade  Hiatal hernia. Abnormal gastric mucosa?"status post gastric biopsy .  Patent esophagus. G tube placement   IR biopsy neck mass/node 03/04/14.  Transfusion 1.5 units packed red blood cells  Consultations:  Oncology  Gastroenterology   Discharge Exam: Filed Vitals:   03/10/14 0446  BP: 126/72  Pulse: 76  Temp: 98.2 F (36.8 C)  Resp: 20    General: thin frail appears somewhat ill but comfortable Cardiovascular: RRR No MGR No LE edema Respiratory: normal effort at rest BS somewhat coarse. No crackles or wheeze Abdomen: flat soft +BS non-tender. G tube intact. Site unremarkable  Discharge Instructions   Current Discharge Medication List    START taking these medications   Details  clindamycin (CLEOCIN) 300 MG capsule Take 1 capsule (300 mg total) by mouth 3 (three) times daily. Qty: 4 capsule, Refills: 0    folic acid (FOLVITE) 1 MG tablet Take 1 tablet (1 mg total) by mouth daily.    !! levalbuterol (XOPENEX) 0.63 MG/3ML nebulizer solution Take 3 mLs (0.63 mg total) by nebulization 3 (three) times daily. Qty: 270 mL, Refills: 1    !! levalbuterol (XOPENEX) 0.63 MG/3ML nebulizer solution Take 3 mLs (0.63 mg total) by nebulization every 6 (six) hours as needed for wheezing or shortness of breath. Qty: 90 mL, Refills: 1    nicotine polacrilex (NICORETTE) 2 MG gum Take 1 each (2 mg total) by mouth as needed for smoking cessation. Qty: 100 tablet, Refills: 1    pantoprazole (PROTONIX) 40 MG tablet Take 1 tablet (40 mg total) by mouth 2 (two) times daily before a meal. Qty: 60 tablet, Refills: 1     !! - Potential duplicate medications found. Please discuss with provider.    CONTINUE these medications which have CHANGED   Details  ferrous sulfate 325 (65 FE) MG tablet Take 2 tablets (650 mg total) by mouth 2 (two) times daily with a meal.      CONTINUE these medications which have NOT CHANGED   Details  atorvastatin (LIPITOR) 10 MG tablet Take 10 mg by mouth every evening.     glipiZIDE (GLUCOTROL) 10 MG tablet Take 10 mg by  mouth 2 (two) times daily before a meal.      lisinopril (PRINIVIL,ZESTRIL) 5 MG tablet Take 5 mg by mouth daily.    polyethylene glycol (MIRALAX / GLYCOLAX) packet Take 17 g by mouth daily.      STOP taking these medications     insulin aspart (NOVOLOG) 100 UNIT/ML injection      LEVEMIR FLEXTOUCH 100 UNIT/ML Pen      levofloxacin (LEVAQUIN) 500 MG tablet      oxyCODONE-acetaminophen (PERCOCET/ROXICET) 5-325 MG per tablet        No Known Allergies Follow-up Information    Follow up with Jim Hazard, MD On 03/26/2014.   Specialties:  Internal Medicine, Oncology   Why:  9:30 AM appointment.  Arrive at 9 AM.   Contact information:   [place] [address] [phone]       Follow up with Jim Pila, NP On 04/08/2014.   Specialty:  Nurse Practitioner   Why:  appointment at 9 am   Contact information:   [address] [place] [address] [phone]       Follow up with Jim Regal, MD. Schedule an appointment as soon as possible for a visit in 1 week.   Specialty:  Family Medicine  Why:  follow up on DM control and track Hg   Contact information:   [address] [place] [address] [phone]       Follow up with Jim Ferris, NP.   Specialty:  Geriatric Medicine   Why:  office will call for appointment ASAP   Contact information:   [place] [address] [phone]        The results of significant diagnostics from this hospitalization (including imaging, microbiology, ancillary and laboratory) are listed below for reference.    Significant Diagnostic Studies: Dg Chest 2 View  03/02/2014   CLINICAL DATA:  Choking on food denied  EXAM: CHEST  2 VIEW  COMPARISON:  02/28/2014  FINDINGS: Right hemidiaphragm remains elevated. Nodular density now projects over the inferior right hilum. Hazy airspace disease at the right base. Left lung is clear. Chronic right-sided rib deformities.  IMPRESSION: Hazy  right lower lobe airspace disease.  New nodular density at the right lung base is indeterminate. Followup studies to ensure resolution are recommended. If the abnormality fails to resolve, CT is recommended.   Electronically Signed   By: Maryclare Bean M.D.   On: 03/02/2014 20:03   Dg Chest 2 View  02/28/2014   CLINICAL DATA:  Cough and congestion for several days.  EXAM: CHEST  2 VIEW  COMPARISON:  05/23/2010  FINDINGS: Mild lung base opacity is noted, greater on the right. This may all be atelectasis. Pneumonia is possible but felt less likely. Elevation the right hemidiaphragm is stable.  No evidence of pulmonary edema. No pleural effusion or pneumothorax.  Cardiac silhouette is normal in size. Normal mediastinal and hilar contours.  Bony thorax is demineralized but intact.  IMPRESSION: 1. Lung base opacity, right greater than left, likely atelectasis. Right lower lobe pneumonia is possible, but felt less likely. 2. Chronic mild elevation the right hemidiaphragm. No pulmonary edema.   Electronically Signed   By: Lajean Manes M.D.   On: 02/28/2014 14:03   Ct Head Wo Contrast  02/15/2014   CLINICAL DATA:  63 year old male with headache.  EXAM: CT HEAD WITHOUT CONTRAST  TECHNIQUE: Contiguous axial images were obtained from the base of the skull through the vertex without intravenous contrast.  COMPARISON:  08/21/2008 and 04/29/2008 head CTs  FINDINGS: Generalized cerebral and cerebellar atrophy again noted.  Mild chronic small-vessel white matter ischemic changes again identified.  Bifrontal encephalomalacia is again noted.  No acute intracranial abnormalities are identified, including mass lesion or mass effect, hydrocephalus, extra-axial fluid collection, midline shift, hemorrhage, or acute infarction.  The visualized bony calvarium is unremarkable.  IMPRESSION: No evidence of acute intracranial abnormality.  Atrophy, chronic small-vessel white matter ischemic changes and bifrontal encephalomalacia.    Electronically Signed   By: Hassan Rowan M.D.   On: 02/15/2014 21:43   Ct Soft Tissue Neck W Contrast  03/03/2014   CLINICAL DATA:  63 year old male with cough, shortness of breath, abnormal neck fullness. Dysphagia for 4-5 months.  EXAM: CT NECK WITH CONTRAST  TECHNIQUE: Multidetector CT imaging of the neck was performed using the standard protocol following the bolus administration of intravenous contrast.  CONTRAST:  63mL OMNIPAQUE IOHEXOL 300 MG/ML SOLN in conjunction with contrast enhanced imaging of the chest reported separately.  COMPARISON:  Noncontrast CTs of the head 02/15/2014 and earlier.  FINDINGS: Chest findings today reported separately.  Study is intermittently degraded by motion artifact despite repeated imaging attempts.  Bulky and confluent cystic/necrotic soft tissue  mass is in the right neck. A conglomeration at the right level 2 nodal station measures up to 45 mm largest dimension. More solid enhancing nodal/ex nodal disease tracking to the right level 4 station. At the level of the lateral wall of the oropharynx there is asymmetric but indistinct soft tissue thickening of the pharynx up to 9 mm (series 8, image 43). Some of the abnormal cystic/necrotic soft tissue mass extends to abut the enter surface of the ramus of the right mandible (series 8, image 44). And there is motion artifact in the region of the mandible. There are prior ORIF changes to the right mandible ramus. Dentition is absent throughout. There is sclerosis of the posterior body of the right mandible, but no bony destruction or erosion identified.  There are smaller cystic/necrotic masses in the left head and neck including at the left level 1B nodal station (series 8, image 54). There is conglomerate involvement at the left Level 3 station. The left level 2 station is spared. There is a mildly enlarged solid-appearing node at the level 1 A station, 10 mm short axis. The largest of these masses measure up to 3.9 cm largest  dimension.  Negative thyroid. Larynx is within normal limits. Negative retropharyngeal space. Negative sublingual space. Negative submandibular and parotid glands except for regional mass effect and right submandibular ductal ectasia. Visualized orbit soft tissues are within normal limits. Grossly negative visible brain parenchyma.  Chronic inflammation of the left maxillary sinus, left middle ear and mastoid, with mucoperiosteal thickening in those regions. There may be a small accessory tooth in the anterior left maxillary sinus (series 17, image 28).  Both carotid and vertebral arteries in the neck remain patent. The right IJ is in dated and thrombosed. The bulky right neck disease is inseparable from the right carotid bifurcation, and tracks medially through the splayed right external and internal carotid arteries. The left IJ is compressed but remains patent.  No acute or suspicious osseous lesion identified. Degenerative changes in the cervical spine.  IMPRESSION: 1. 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 This invades and occludes the right internal jugular vein, and encases the right carotid bifurcation. 2. Left level 1 and level 3 nodal station involvement as well. 3. Primary tumor site is uncertain, but might be the right tonsillar pillar. 4. Chest CT findings today reported separately.   Electronically Signed   By: Lars Pinks M.D.   On: 03/03/2014 12:55   Ct Chest W Contrast  03/03/2014   CLINICAL DATA:  Cough, shortness of breath. Neck fullness. Dysphagia.  EXAM: CT CHEST WITH CONTRAST  TECHNIQUE: Multidetector CT imaging of the chest was performed during intravenous contrast administration.  CONTRAST:  92mL OMNIPAQUE IOHEXOL 300 MG/ML  SOLN  COMPARISON:  Chest CT 01/05/2010  FINDINGS: Neck findings reported separately.  Visualized thyroid is unremarkable. No axillary or mediastinal  lymphadenopathy. Bilateral lower neck adenopathy, see dedicated neck CT report for description. Normal heart size. No pericardial effusion. Aorta and main pulmonary artery are normal in caliber.  Central airways are patent. There is a 1.9 x 1.7 cm nodule within the right lower lobe (image 34; series 3). There is an adjacent 0.6 cm right lower lobe nodule (image 34; series 3). There is a 0.5 cm right upper lobe nodule (image 21; series 3). There is a 3 mm right upper lobe nodule (image 15; series 3). Dependent ground-glass and consolidative  opacity demonstrated within the right and left lower lobes. Additionally there is left lower lobe bronchial wall thickening. No pleural effusion or pneumothorax.  Visualization of the upper abdomen demonstrates a centrally necrotic enhancing mass within the hepatic dome measuring 2.8 x 2.9 cm. There is an additional 0.7 cm low-attenuation lesion within the hepatic dome (image 44; series 2). There is an additional 1.9 x 1.9 cm peripherally enhancing centrally necrotic lesion within the hepatic dome (image 40; series 2). Re- demonstrated hepatic parenchymal calcifications.  Mild heterogeneity along the inferior margin of right hepatic lobe. This is incompletely evaluated and focal lesion at this location is not excluded. Small amount of perihepatic fluid. Small hiatal hernia. Diffuse esophageal wall thickening, most pronounced near the junction.  There is dilatation of the main pancreatic duct measuring up to 11 mm.  Multiple old posterior rib fractures. No aggressive or acute appearing osseous lesions. Degenerative changes of the lower thoracic and lumbar vertebral bodies. Gynecomastia.  IMPRESSION: 1. Multiple pulmonary nodules, concerning for metastatic disease. 2. Multiple centrally necrotic peripherally enhancing hepatic masses, concerning for metastatic disease. 3. Marked dilatation of the main pancreatic duct measuring up to 11 mm. The causative etiology is not identified on  current evaluation as the entire pancreas is not included. Recommend dedicated evaluation with contrast-enhanced CT or potentially MRI. 4. Bilateral lower cervical adenopathy, most compatible with metastatic disease. See dedicated neck CT report. 5. Bilateral lower lobe ground-glass opacities, favored to represent atelectasis. Additionally there is left lower lobe bronchial wall thickening, raising the possibility of associated aspiration. 6. Diffuse wall thickening of the esophagus, most pronounced near the GE junction. While findings may be secondary to esophagitis, underlying distal esophageal mass is not entirely excluded. Consider correlation with EGD.  These results will be called to the ordering clinician or representative by the Radiologist Assistant, and communication documented in the PACS or zVision Dashboard.   Electronically Signed   By: Lovey Newcomer M.D.   On: 03/03/2014 13:20   US Soft Tissue Head/neck  03/04/2014   CLINICAL DATA:  Neck mass, suspected cervical adenopathy by neck CT  EXAM: ULTRASOUND OF HEAD/NECK SOFT TISSUES  TECHNIQUE: Ultrasound examination of the head and neck soft tissues was performed in the area of clinical concern.  COMPARISON:  None  FINDINGS: In the RIGHT cervical region, 3 soft tissue masses are identified likely representing enlarged lymph nodes.  More superior nodule is heterogeneous with areas of hyper echogenicity to hypoechoic question complex cystic change, 4.1 x 2.6 x 2.8 cm, containing internal blood flow on color Doppler imaging.  A more in inferior nodule measures 4.2 x 2.4 x 2.7 cm, is predominantly iso- to hypoechoic with mild heterogeneity, with more pronounced internal blood flow.  A smaller inferior RIGHT side nodule is identified measuring 1.5 x 1.1 x 1.0 cm, homogeneous relatively isoechoic.  In addition, an enlarged LEFT cervical node is identified, 2.3 x 3.6 x 1.8 cm, heterogeneous in echogenicity and containing internal blood flow.  The 3 largest  nodules contain tiny echogenic foci which likely represent tiny calcifications.  IMPRESSION: Multiple soft tissue nodules within the neck as above most likely representing enlarged abnormal appearing cervical lymph nodes.   Electronically Signed   By: Lavonia Dana M.D.   On: 03/04/2014 14:19   US Guided Needle Placement  03/04/2014   CLINICAL DATA:  Cervical adenopathy  EXAM: ULTRASOUND GUIDED CORE NEEDLE BIOPSY OF A RIGHT CERVICAL NODE  COMPARISON:  Previous exams.  FINDINGS: Procedure, risks, benefits and alternatives discussed  with the patient.  Patient's questions answered.  Written informed consent for core biopsy of the enlarged RIGHT cervical lymph nodes was obtained.  Time-out protocol followed.  Dominant nodule in the inferior RIGHT cervical region localized by ultrasound  Nodule measures 4.2 x 2.4 x 2.7 cm.  Skin prepped and draped in usual sterile fashion.  Skin and overlying soft tissues anesthetized with 3 mL of 2% lidocaine lidocaine.  Under direct sonographic visualization, three 14 gauge core biopsies of the targeted enlarged RIGHT cervical lymph node were performed.  Ultrasound visualization was utilized to confirm core sample acquisition within the targeted lymph node.  Obtained tissue cores appeared adequate and were placed in the appropriate container.  Procedure tolerated well by patient.  Specimen sent to pathology for evaluation.  No evidence of hematoma on post procedural imaging.  IMPRESSION: Three ultrasound guided core biopsies of an enlarged RIGHT cervical lymph node as above.  No acute abnormalities.   Electronically Signed   By: Lavonia Dana M.D.   On: 03/04/2014 15:53   Ct Abdomen Pelvis W Contrast  03/05/2014   CLINICAL DATA:  Malignancy.  EXAM: CT ABDOMEN AND PELVIS WITH CONTRAST  TECHNIQUE: Multidetector CT imaging of the abdomen and pelvis was performed using the standard protocol following bolus administration of intravenous contrast.  CONTRAST:  10mL OMNIPAQUE IOHEXOL 300  MG/ML SOLN, 144mL OMNIPAQUE IOHEXOL 300 MG/ML SOLN  COMPARISON:  CT scan of August 19, 2009.  FINDINGS: 23 x 16 mm irregular density is noted posteriorly in the right lower lobe concerning for metastatic disease. Smaller nodule is noted more anteriorly in the right lower lobe. Old right rib fractures are noted. No other significant osseous abnormality seen in the visualized portion of the abdomen or pelvis.  Hepatic cirrhosis is again noted with calcification present within right hepatic cleft. Mild cholelithiasis is noted. The spleen appears normal. Pancreatic head calcifications are noted suggesting chronic pancreatitis. There is interval development of severe pancreatic ductal dilatation suggesting obstruction. No pancreatic mass is noted. No biliary dilatation is noted. 2.1 cm ill-defined low density is noted in the medial portion the right hepatic lobe concerning for metastatic disease or malignancy. Adrenal glands appear normal. Nonobstructive calculus is noted in lower pole collecting system of left kidney. Bilateral hydronephrosis is noted without evidence of ureteral calculus. Mild urinary bladder distention is noted.  Large irregular mass measuring 7.1 x 4.9 cm is seen involving the cecum consistent with colonic malignancy. Multiple enlarged satellite masses or lesions are noted concerning for metastatic disease in the right lower quadrant mesenteric region. The largest measures 3.3 x 2.6 cm in size. There is no evidence of bowel obstruction. Stool is noted in the rectum. No abnormal fluid collection is noted.  Atherosclerotic calcifications of abdominal aorta are noted without aneurysm formation.  Very large right inguinal hernia is noted which contains loops of small bowel, but does not result in incarceration or obstruction. This extends into the scrotum. Is significantly increased in size compared to prior exam.  IMPRESSION: Large right inguinal hernia is noted which is significantly increased in size  compared to prior exam, which extends into the scrotum. And contains loops of small bowel, but does not result in incarceration or obstruction.  Hepatic cirrhosis is noted. 2.1 cm ill-defined low density is noted in the medial portion of the right hepatic lobe consistent with metastatic disease or malignancy.  Mild cholelithiasis.  2.3 cm irregular density seen posteriorly in the right lower lobe concerning for metastatic disease.  Large irregular  mass is seen involving the cecum consistent with colonic malignancy. Multiple and large satellite lesions are masses are noted in the right lower quadrant mesenteric region consistent with metastatic disease.  Nonobstructive left nephrolithiasis is noted.  Bilateral hydronephrosis is noted without evidence of obstructing calculus. This is concerning for distal ureteral obstruction of unknown etiology, or potentially may be due to urinary bladder distention.  Pancreatic head calcifications are noted suggesting chronic pancreatitis. There is interval development of severe dilatation of the pancreatic duct without common bile duct dilatation. This is concerning for ductal obstruction.   Electronically Signed   By: Sabino Dick M.D.   On: 03/05/2014 21:40   [place] Core Biopsy  03/04/2014   CLINICAL DATA:  Cervical adenopathy  EXAM: ULTRASOUND GUIDED CORE NEEDLE BIOPSY OF A RIGHT CERVICAL NODE  COMPARISON:  Previous exams.  FINDINGS: Procedure, risks, benefits and alternatives discussed with the patient.  Patient's questions answered.  Written informed consent for core biopsy of the enlarged RIGHT cervical lymph nodes was obtained.  Time-out protocol followed.  Dominant nodule in the inferior RIGHT cervical region localized by ultrasound  Nodule measures 4.2 x 2.4 x 2.7 cm.  Skin prepped and draped in usual sterile fashion.  Skin and overlying soft tissues anesthetized with 3 mL of 2% lidocaine lidocaine.  Under direct sonographic visualization, three 14 gauge core biopsies of  the targeted enlarged RIGHT cervical lymph node were performed.  Ultrasound visualization was utilized to confirm core sample acquisition within the targeted lymph node.  Obtained tissue cores appeared adequate and were placed in the appropriate container.  Procedure tolerated well by patient.  Specimen sent to pathology for evaluation.  No evidence of hematoma on post procedural imaging.  IMPRESSION: Three ultrasound guided core biopsies of an enlarged RIGHT cervical lymph node as above.  No acute abnormalities.   Electronically Signed   By: Lavonia Dana M.D.   On: 03/04/2014 15:53   Dg Swallowing Func-speech Pathology  03/06/2014   Ephraim Hamburger, CCC-SLP     03/06/2014  1:12 PM Objective Swallowing Evaluation: Modified Barium Swallowing Study    Patient Details  Name: [name] MRN: [id number] Date of Birth: [birth year]  Today's Date: 03/06/2014 Time: 1130-1200 SLP Time Calculation (min) (ACUTE ONLY): 30 min  Past Medical History:  Past Medical History  Diagnosis Date  . Diabetes mellitus   . Liver disease   . Enlarged prostate   . Hernia   . Alcohol dependence   . Seizures   . Hypercholesterolemia   . Liver lesion   . Pulmonary nodules   . Cervical mass     biopsy 03/04/14  . Metastatic disease   . Anemia   . Malnutrition   . Esophageal varices   . Hydronephrosis   . Cholelithiasis    Past Surgical History:  Past Surgical History  Procedure Laterality Date  . Appendectomy     HPI:  Mr. [name] is a 63 yo male who was admitted to [facility]  03/02/2014 with history of likely ETOH cirrhosis, presenting with  episode of choking and 4-5 month history of solid food dysphagia.  Multiple imaging studies (CT neck, CT chest, US abdomen)  performed since admission with metastatic disease and unknown  primary. CA 19-9 elevated at 95.8, AFP tumor marker normal.  Oncology consult appreciated. Neck node biopsy completed  yesterday, results pending. Also with esophageal wall thickening  on imaging and EGD tentatively  planned for today. Also with plans  for CT A/P with contrast  today. Anemia: multifactorial,  iron-deficiency noted. No overt signs of GI bleeding. Hemoccult  status unknown but has been ordered. Continue to monitor.  Received 1 and 1/2 units of blood over night. SLP asked to  clinically evaluate swallow due to reports of solid food  dysphagia.     Assessment / Plan / Recommendation Clinical Impression  Dysphagia Diagnosis: Mild oral phase dysphagia;Moderate  pharyngeal phase dysphagia;Severe pharyngeal phase dysphagia Clinical impression: Mr Schellhorn presents with mild oral phase  dysphagia in setting of edentulous status with impaired  mastication and weak lingual manipulation resulting in prolonged  oral transit and residue post swallow; mod/severe sensorimotor  and anatomically altered pharyngeal phase dysphagia characterized  by mild delay in swallow initiation, decreased tongue base  retraction, decreased hyolaryngeal excursion, decreased  pharyngeal pressure and decreased airway protection resulting in  significant residuals in pharynx post swallow in the valleculae,  pyriforms, and posterior pharyngeal wall. Residuals are more  pronounced with puree and semi solid textures, however liquids  are pooled in pyriforms which pt transiently aspirates (trace  amounts) post swallow. Penetration occurs during the swallow  (from residuals most likely) with liquids, however with cued  cough, pt is able to clear. Alternating semi solids and liquids  was only marginally effective in reducing pharyngeal residue. Pt  did not overtly cough during trace aspiration down posterior  tracheal wall, however he had wet vocal qualiity and some throat  clearing. Honey-thick liquids were not assessed as it was felt  that it would only increase residuals. Pt has osteophytes along  the cervical and thoracic spine with a more pronounced  impingement into pharyngeal space at C4-5 which is where most  solid residuals are delayed/pooled.  While this is obviously not  an acute finding, Mr. Bray has significant pharyngeal weakness  which makes it difficult to clear the residuals. The presence of  suspected mass was visualized and may also have deliterious  effects on pharyngeal swallow (particularly cricopharyngeal  function) also contributing to significant residuals. Pt is  judged to be at risk for all textures/consistencies, however a  "safest diet" can be recommended to avoid alternative sources of  nutrition. Recommend D1/puree and thin liquids; pt to cough after  each swallow and repeat swallow throughout meals. Recommendations  reviewed with pt and precautions posted. No family present.  Prognosis for improvement is poor given mass.    Treatment Recommendation  Therapy as outlined in treatment plan below    Diet Recommendation Dysphagia 1 (Puree);Thin liquid   Liquid Administration via: Cup Medication Administration: Crushed with puree Supervision: Patient able to self feed;Intermittent supervision  to cue for compensatory strategies Compensations: Slow rate;Multiple dry swallows after each  bite/sip;Follow solids with liquid;Hard cough after  swallow;Effortful swallow Postural Changes and/or Swallow Maneuvers: Seated upright 90  degrees;Upright 30-60 min after meal;Out of bed for meals    Other  Recommendations Recommended Consults: MBS Oral Care Recommendations: Oral care BID Other Recommendations: Clarify dietary restrictions   Follow Up Recommendations  Home health SLP    Frequency and Duration min 1 x/week  1 week   Pertinent Vitals/Pain VSS    SLP Swallow Goals   Pt will demonstrate safe and efficient consumption of least  restrictive diet with use of strategies as needed.    General Date of Onset: 03/02/14 HPI: Mr. Bronx Brogden is a 63 yo male who was admitted to [facility]  03/02/2014 with history of likely ETOH cirrhosis, presenting with  episode of choking and 4-5 month history of  solid food dysphagia.  Multiple imaging studies (CT neck,  CT chest, US abdomen)  performed since admission with metastatic disease and unknown  primary. CA 19-9 elevated at 95.8, AFP tumor marker normal.  Oncology consult appreciated. Neck node biopsy completed  yesterday, results pending. Also with esophageal wall thickening  on imaging and EGD tentatively planned for today. Also with plans  for CT A/P with contrast today. Anemia: multifactorial,  iron-deficiency noted. No overt signs of GI bleeding. Hemoccult  status unknown but has been ordered. Continue to monitor.  Received 1 and 1/2 units of blood over night. SLP asked to  clinically evaluate swallow due to reports of solid food  dysphagia. Type of Study: Modified Barium Swallowing Study Reason for Referral: Objectively evaluate swallowing function Previous Swallow Assessment: 2010 Diet Prior to this Study: Thin liquids (full liquids) Temperature Spikes Noted: No Respiratory Status: Room air History of Recent Intubation: No Behavior/Cognition: Alert;Cooperative Oral Cavity - Dentition: Edentulous Oral Motor / Sensory Function: Impaired - see Bedside swallow  eval Self-Feeding Abilities: Able to feed self Patient Positioning: Upright in chair Baseline Vocal Quality: Wet Volitional Cough: Congested;Weak Volitional Swallow: Able to elicit Anatomy: Other (Comment) (osteophytes; mass-like opaque shape  noted at level of ~C6) Pharyngeal Secretions: Not observed secondary MBS    Reason for Referral Objectively evaluate swallowing function   Oral Phase Oral Preparation/Oral Phase Oral Phase: Impaired Oral - Solids Oral - Mechanical Soft: Impaired mastication;Weak lingual  manipulation;Reduced posterior propulsion;Delayed oral  transit;Lingual/palatal residue   Pharyngeal Phase Pharyngeal Phase Pharyngeal Phase: Impaired Pharyngeal - Nectar Pharyngeal - Nectar Cup: Premature spillage to pyriform  sinuses;Reduced pharyngeal peristalsis;Reduced epiglottic  inversion;Reduced anterior laryngeal mobility;Reduced laryngeal   elevation;Reduced airway/laryngeal closure;Reduced tongue base  retraction;Penetration/Aspiration after swallow;Trace  aspiration;Pharyngeal residue - valleculae;Pharyngeal residue -  pyriform sinuses;Pharyngeal residue - cp segment;Lateral channel  residue Penetration/Aspiration details (nectar cup): Material enters  airway, passes BELOW cords without attempt by patient to eject  out (silent aspiration);Material enters airway, passes BELOW  cords then ejected out Pharyngeal - Thin Pharyngeal - Thin Cup: Premature spillage to pyriform  sinuses;Premature spillage to valleculae;Reduced pharyngeal  peristalsis;Reduced epiglottic inversion;Reduced anterior  laryngeal mobility;Reduced laryngeal elevation;Reduced  airway/laryngeal closure;Reduced tongue base  retraction;Penetration/Aspiration after swallow;Trace  aspiration;Pharyngeal residue - valleculae;Pharyngeal residue -  pyriform sinuses;Pharyngeal residue - cp segment;Inter-arytenoid  space residue;Lateral channel residue Penetration/Aspiration details (thin cup): Material enters  airway, passes BELOW cords without attempt by patient to eject  out (silent aspiration);Material enters airway, passes BELOW  cords then ejected out Pharyngeal - Solids Pharyngeal - Puree: Premature spillage to valleculae;Reduced  pharyngeal peristalsis;Reduced epiglottic inversion;Reduced  anterior laryngeal mobility;Reduced laryngeal elevation;Reduced  airway/laryngeal closure;Reduced tongue base  retraction;Pharyngeal residue - valleculae;Pharyngeal residue -  pyriform sinuses;Pharyngeal residue - posterior  pharnyx;Pharyngeal residue - cp segment Pharyngeal - Mechanical Soft: Premature spillage to  valleculae;Reduced anterior laryngeal mobility;Reduced epiglottic  inversion;Reduced pharyngeal peristalsis;Reduced laryngeal  elevation;Reduced airway/laryngeal closure;Reduced tongue base  retraction;Pharyngeal residue - valleculae;Pharyngeal residue -  pyriform sinuses;Pharyngeal residue  - posterior  pharnyx;Pharyngeal residue - cp segment Pharyngeal - Pill: Not tested Pharyngeal Phase - Comment Pharyngeal Comment: Residuals more pronounced with puree and semi  solids; cue to cough after swallow was effective in removing  liquids to cords  Cervical Esophageal Phase    GO    Cervical Esophageal Phase Cervical Esophageal Phase: Impaired Cervical Esophageal Phase - Solids Puree: Prominent cricopharyngeal segment        Thank you,  Genene Churn, [place]  PORTER,DABNEY 03/06/2014, 1:10 PM  US Abdomen Limited Ruq  03/03/2014   CLINICAL DATA:  Cirrhosis, diabetes, hypercholesterolemia, abnormal CT exam in 2011 showing a questionable hepatic abnormality  EXAM: US ABDOMEN LIMITED - RIGHT UPPER QUADRANT  COMPARISON:  CT chest 01/05/2010, CT abdomen 08/19/2009  FINDINGS: Gallbladder:  Thickened gallbladder wall. Shadowing calculi and sludge within gallbladder. Gallbladder incompletely distended. No sonographic Murphy sign. Mild amount of pericholecystic fluid.  Common bile duct:  Diameter: Normal caliber 4 mm diameter  Liver:  Increased hepatic echogenicity with nodular margins consistent with cirrhosis. Hepatopetal portal venous flow. Question nodule within RIGHT lobe 3.0 x 1.9 x 2.6 cm. Linear calcification within RIGHT lobe on prior CT exam again identified.  Mild RIGHT renal collecting system dilatation noted.  Mildly heterogeneous appearance of the pancreas is identified, atrophic, poorly assessed due to overlying bowel.  IMPRESSION: Thickened gallbladder wall nonspecific in the setting of minimal ascites.  Gallstones and sludge within gallbladder without definite sonographic Murphy sign.  If there is persistent clinical concern for acute cholecystitis recommend hepatobiliary imaging.  Cirrhotic appearing liver with linear calcification in RIGHT lobe again identified.  Nonspecific RIGHT lobe liver lesion 3.0 x 1.9 x 2.6 cm, uncertain etiology, not definitely seen in prior exam;  recommend followup MR assessment with and without contrast to characterize.   Electronically Signed   By: Lavonia Dana M.D.   On: 03/03/2014 14:35    Microbiology: Recent Results (from the past 240 hour(s))  Culture, blood (routine x 2) Call MD if unable to obtain prior to antibiotics being given     Status: None   Collection Time: 03/02/14 11:23 PM  Result Value Ref Range Status   Specimen Description BLOOD RIGHT ANTECUBITAL  Final   Special Requests BOTTLES DRAWN AEROBIC AND ANAEROBIC [place]  Final   Culture NO GROWTH 5 DAYS  Final   Report Status 03/07/2014 FINAL  Final  Culture, blood (routine x 2) Call MD if unable to obtain prior to antibiotics being given     Status: None   Collection Time: 03/02/14 11:23 PM  Result Value Ref Range Status   Specimen Description BLOOD RIGHT ANTECUBITAL  Final   Special Requests   Final    BOTTLES DRAWN AEROBIC AND ANAEROBIC AEB=6CC ANA=4CC   Culture  Setup Time   Final    03/04/2014 14:50 Performed at [facility]    Culture   Final    STAPHYLOCOCCUS SPECIES (COAGULASE NEGATIVE) Note: THE SIGNIFICANCE OF ISOLATING THIS ORGANISM FROM A SINGLE VENIPUNCTURE CANNOT BE PREDICTED WITHOUT FURTHER CLINICAL AND CULTURE CORRELATION. SUSCEPTIBILITIES AVAILABLE ONLY ON REQUEST. Note: Gram Stain Report Called to,Read Back By and Verified With: THOMAS [id number]  ON 03/03/14 BY FORSYTHK Performed at [facility] Performed at [facility]    Report Status 03/05/2014 FINAL  Final  MRSA PCR Screening     Status: None   Collection Time: 03/03/14 12:10 AM  Result Value Ref Range Status   MRSA by PCR NEGATIVE NEGATIVE Final    Comment:        The GeneXpert MRSA Assay (FDA approved for NASAL specimens only), is one component of a comprehensive MRSA colonization surveillance program. It is not intended to diagnose MRSA infection nor to guide or monitor treatment for MRSA infections.   Clostridium Difficile by PCR     Status: None    Collection Time: 03/06/14  3:20 PM  Result Value Ref Range Status   C difficile by pcr NEGATIVE NEGATIVE Final     Labs: Basic Metabolic Panel:  Recent Labs Lab 03/06/14 0610 03/07/14 0557 03/08/14 0605 03/09/14 0606 03/10/14 0608  NA 134* 132* 129* 134* 129*  K 4.2 4.6 5.0 4.8 4.6  CL 97 95* 92* 96 94*  CO2 27 26 25 25 29   GLUCOSE 135* 235* 268* 100* 270*  BUN 5* 4* 5* 4* 5*  CREATININE 0.70 0.65 0.64 0.67 0.64  CALCIUM 8.5 8.5 8.9 9.0 8.5   Liver Function Tests:  Recent Labs Lab 03/09/14 0606  AST 12  ALT 6  ALKPHOS 164*  BILITOT 0.3  PROT 7.3  ALBUMIN 2.5*   CBC:  Recent Labs Lab 03/05/14 1057 03/06/14 0610 03/07/14 0557 03/09/14 0606 03/10/14 0608  WBC 14.9* 17.5* 14.1* 15.1* 13.2*  HGB 12.0* 11.0* 11.2* 12.2* 12.0*  HCT 36.9* 33.0* 34.7* 37.8* 37.2*  MCV 72.8* 71.6* 71.7* 71.6* 71.8*  PLT 324 335 369 409* 408*   CBG:  Recent Labs Lab 03/09/14 1418 03/09/14 1736 03/09/14 2211 03/10/14 0747 03/10/14 1138  GLUCAP 123* 78 226* 261* 187*       Signed:  [name] M  [facility] 03/10/2014, 12:46 PM   Murray Hodgkins, MD [facility] [phone]

## 2014-03-10 NOTE — Progress Notes (Signed)
NUTRITION FOLLOW UP  Pt meets criteria for severe MALNUTRITION in the context of chronic illness as evidenced by severe muscle wasting clavicles, patella, quadricep regions, and 15% weight loss within 7 months.   Intervention:   -Follow for nutrition plan of care -Continue Glucerna Shake po TID, each supplement provides 220 kcal and 10 grams of protein  -Recommendations for bolus feedings:   -2 cans (474 ml) of Jevity 1.2 TID   -Recommend 120 ml free water flush TID  -Tube feeding regimen provides 1710 kcal (95% of needs),  79 grams of protein, and 1146 ml of H2O (1506 ml total  water with flushes).   -Note: If recommended enteral formula is unable to be purchased due to financial constraints, recommend:   -2 cans (474 ml) of Ensure Plus TID   -Recommend 100 ml free water flush TID  -Tube feeding regimen provides 2100 kcal (100% of  needs) and78 grams of protein.  Nutrition Dx:   Inadequate oral intake related to dysphagia? as evidenced by severe wasting clavicles, patella, quadricept regions, 15% wt loss x 7 months; ongoing  Goal:  Pt to meet >/= 90% of their estimated nutrition needs; progressing  Monitor:   Protein-energy intake, labs and wt trends   Assessment:   Pt lives with his sister. He has swallow difficulty and limited solid food intake. hx of alcohol abuse, liver disease and diabetes. [place] of abdomen and EGD with dilation planned.   RD received consult for supplement and TF recommendations. Pt was last seen on 03/09/14. Pt s/p TCS for tissue diagnosis and PEG placement on 03/09/14. Per MD notes, suspect multiple malignancies. Treatment of hospice vs chemotherapy being considered. Potential for discharge today per MD notes.  Pt was asleep at time of visit and would not arouse to name being called. He has been advanced to a dysphagia 1 diet with thin liquids (safest diet per SLP recommendations). Intake 25% of dinner yesterday, however, meal completion has been 75-100% since  admission.  Took call from [place], Tourist information centre manager, who reports she is looking for bolus feeding recommendations for [place].  Labs reviewed. Na: 129, Cl: 94, BUN: 5, Glucose: 270. CBGS: 78-261.  Height: Ht Readings from Last 1 Encounters:  03/02/14 6' (1.829 m)    Weight Status:   Wt Readings from Last 1 Encounters:  03/02/14 115 lb 8.3 oz (52.4 kg)    Re-estimated needs:  Kcal: 1800-2080 ( to promote gradual wt gain) Protein: 75-85 gr Fluid: >1500 ml daily  Skin: Intact  Diet Order: DIET - DYS 1   Intake/Output Summary (Last 24 hours) at 03/10/14 1051 Last data filed at 03/10/14 0425  Gross per 24 hour  Intake 326.33 ml  Output   2500 ml  Net -2173.67 ml    Last BM: 03/08/14   Labs:   Recent Labs Lab 03/08/14 0605 03/09/14 0606 03/10/14 0608  NA 129* 134* 129*  K 5.0 4.8 4.6  CL 92* 96 94*  CO2 25 25 29   BUN 5* 4* 5*  CREATININE 0.64 0.67 0.64  CALCIUM 8.9 9.0 8.5  GLUCOSE 268* 100* 270*    CBG (last 3)   Recent Labs  03/09/14 1736 03/09/14 2211 03/10/14 0747  GLUCAP 78 226* 261*    Scheduled Meds: . clindamycin (CLEOCIN) IV  600 mg Intravenous Q8H  . feeding supplement (GLUCERNA SHAKE)  237 mL Oral TID BM  . folic acid  1 mg Oral Daily  . insulin aspart  0-9 Units Subcutaneous TID  WC  . insulin detemir  5 Units Subcutaneous QHS  . levalbuterol  0.63 mg Nebulization TID  . lisinopril  5 mg Oral Daily  . multivitamin with minerals  1 tablet Oral Daily  . nicotine  21 mg Transdermal Daily  . pantoprazole  40 mg Oral BID AC  . thiamine  100 mg Oral Daily   Or  . thiamine  100 mg Intravenous Daily    Continuous Infusions:   Chontel Warning A. Jimmye Norman, RD, LDN Pager: [phone]
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
## 2014-03-10 NOTE — Progress Notes (Signed)
Subjective: Patient in bed.  I spoke with Dr. Oneida Alar yesterday and she notes a colonic mass and she was successful at putting in a PEG tube.  Patient is informed of his outpatient appointment at the [facility] on 1/7.  I requested he bring his sister to that appointment.  He denies any complaints this AM.  I suspect he will be discharged home today.  Objective: Vital signs in last 24 hours: Temp:  [97.4 F (36.3 C)-98.2 F (36.8 C)] 98.2 F (36.8 C) (12/22 0446) Pulse Rate:  [65-104] 76 (12/22 0446) Resp:  [14-35] 20 (12/22 0446) BP: (107-163)/(55-96) 126/72 mmHg (12/22 0446) SpO2:  [79 %-100 %] 100 % (12/22 0712)  Intake/Output from previous day: 12/21 0800 - 12/22 0759 In: 526.3 [P.O.:240; I.V.:86.3; IV Piggyback:200] Out: 2500 [Urine:2500] Intake/Output this shift:    General appearance: alert, cooperative, appears older than stated age and no distress  Lab Results:   Recent Labs  03/09/14 0606 03/10/14 0608  WBC 15.1* 13.2*  HGB 12.2* 12.0*  HCT 37.8* 37.2*  PLT 409* 408*   BMET  Recent Labs  03/09/14 0606 03/10/14 0608  NA 134* 129*  K 4.8 4.6  CL 96 94*  CO2 25 29  GLUCOSE 100* 270*  BUN 4* 5*  CREATININE 0.67 0.64  CALCIUM 9.0 8.5    Studies/Results: No results found.  Medications: I have reviewed the patient's current medications.  Assessment/Plan: 1. Squamous cell carcinoma of neck mass. 2. Colonic mass, undergoing TCS today with biopsy by Dr. Oneida Alar.  3. Hypoalbuminemia 4. Aspiration 5. Leukocytosis 6. Normocytic, normochromic anemia 7. If patient has two separate malignancies, treatment decision will need to be considered, and he may benefit from Hospice rather than chemotherapy. He is S/P PEG tube placement on 03/09/2014 to help with nutrition, palliation, and possibly aspiration (although studies demonstrate PEG tube placement does not improve aspiration).  We may be able to palliate the neck mass with radiation (and  thus and additional role for PEG tube placement to help with radiation symptoms).  He will need education and dietician recommendations regarding PEG tube supplementation.  He will also need Home Health to help manage PEG tube at home.  He has an outpatient appointment at the [facility] on 03/26/2013 at 9:30 AM at which time biopsy results from colon mass biopsy will be reported and we can present him and his sister with that information and our recommendations regarding palliative treatment or hospice.  Patient and plan discussed with Dr. Ancil Linsey and she is in agreement with the aforementioned.     LOS: 8 days    KEFALAS,THOMAS 03/10/2014   Agree with plan above.  We will plan on seeing patient as detailed. S. Kenly Xiao MD

## 2014-03-10 NOTE — Progress Notes (Signed)
Inpatient Diabetes Program Recommendations  AACE/ADA: New Consensus Statement on Inpatient Glycemic Control (2013)  Target Ranges:  Prepandial:   less than 140 mg/dL      Peak postprandial:   less than 180 mg/dL (1-2 hours)      Critically ill patients:  140 - 180 mg/dL   Results for CHEVY, SWEIGERT (MRN [id number]) as of 03/10/2014 08:30  Ref. Range 03/09/2014 07:58 03/09/2014 11:40 03/09/2014 14:18 03/09/2014 17:36 03/09/2014 22:11 03/10/2014 07:47  Glucose-Capillary Latest Range: 70-99 mg/dL 116 (H) 139 (H) 123 (H) 78 226 (H) 261 (H)   Diabetes history: DM Outpatient Diabetes medications: Glipizide 10 mg BID, Levemir 50 units QHS, Novolog 16 units QAM (Noted patient reports that he has not been taking insulin. He is only taking oral DM medication) Current orders for Inpatient glycemic control: Novolog 0-9 units AC  Inpatient Diabetes Program Recommendations Insulin - Basal: Please consider reordering low dose basal insulin. Previous order was for Levrmir 5 units QHS which would be appropriate to resume. Insulin - Meal Coverage: Noted patient had a PEG tube placed and has Dys 1 diet ordered. If patient starts on tube feeding or PO intake improves, may need to consider ordering meal coverage. Insulin-correction: May want to consider changing frequency to Q4H if PO intake remains variable.  Thanks, Barnie Alderman, RN, MSN, CCRN, CDE Diabetes Coordinator Inpatient Diabetes Program [phone] (Team Pager) [phone] ([facility] office) [phone] [facility] office)

## 2014-03-10 NOTE — Progress Notes (Signed)
    Subjective: Denies abdominal pain, N/V. PEG tube placed yesterday.   Objective: Vital signs in last 24 hours: Temp:  [97.4 F (36.3 C)-98.2 F (36.8 C)] 98.2 F (36.8 C) (12/22 0446) Pulse Rate:  [65-104] 76 (12/22 0446) Resp:  [14-35] 20 (12/22 0446) BP: (107-163)/(55-96) 126/72 mmHg (12/22 0446) SpO2:  [79 %-100 %] 100 % (12/22 0712) Last BM Date: 03/08/14 General:   Alert and oriented, pleasant Abdomen:  Bowel sounds present, soft, non-tender, non-distended. PEG tube site clean, dry, intact. Large right inguinal/scrotal hernia Psych:  Alert and cooperative. Normal mood and affect.  Intake/Output from previous day: 12/21 0701 - 12/22 0700 In: 526.3 [P.O.:240; I.V.:86.3; IV Piggyback:200] Out: 3500 [Urine:3500] Intake/Output this shift:    Lab Results:  Recent Labs  03/09/14 0606 03/10/14 0608  WBC 15.1* 13.2*  HGB 12.2* 12.0*  HCT 37.8* 37.2*  PLT 409* 408*   BMET  Recent Labs  03/08/14 0605 03/09/14 0606 03/10/14 0608  NA 129* 134* 129*  K 5.0 4.8 4.6  CL 92* 96 94*  CO2 25 25 29   GLUCOSE 268* 100* 270*  BUN 5* 4* 5*  CREATININE 0.64 0.67 0.64  CALCIUM 8.9 9.0 8.5   LFT  Recent Labs  03/09/14 0606  PROT 7.3  ALBUMIN 2.5*  AST 12  ALT 6  ALKPHOS 164*  BILITOT 0.3   Assessment: 63 year old male with squamous cell carcinoma of neck, near-obstructing mass in cecum s/p biopsy, PEG tube placed 12/21. Concern for 2 primary malignancies and may benefit from hospice instead of treatments. Metastatic disease on imaging. To be discharged today. PEG appropriate for feeds and meds. Need to record top of bumper each shift.   As of note, pancreatic ductal dilatation on CT 12/17. With extent of disease process, await final reports from biopsies and treatment regimens suggested.   Follow-up with oncology as planned. Will follow-up on colonic mass biopsies as they come available.   Orvil Feil, ANP-BC [facility]      LOS: 8 days     03/10/2014, 8:36 AM

## 2014-03-11 ENCOUNTER — Encounter (HOSPITAL_COMMUNITY): Payer: Self-pay | Admitting: Gastroenterology

## 2014-03-15 ENCOUNTER — Encounter (HOSPITAL_COMMUNITY): Payer: Self-pay

## 2014-03-15 ENCOUNTER — Encounter: Payer: Self-pay | Admitting: Internal Medicine

## 2014-03-15 ENCOUNTER — Inpatient Hospital Stay (HOSPITAL_COMMUNITY)
Admission: EM | Admit: 2014-03-15 | Discharge: 2014-03-20 | DRG: 871 | Disposition: A | Discharge status: Skilled Nursing Facility | Payer: Medicare Other | Attending: Family Medicine | Admitting: Family Medicine

## 2014-03-15 ENCOUNTER — Emergency Department (HOSPITAL_COMMUNITY): Payer: Medicare Other

## 2014-03-15 DIAGNOSIS — J189 Pneumonia, unspecified organism: Secondary | ICD-10-CM

## 2014-03-15 DIAGNOSIS — E162 Hypoglycemia, unspecified: Secondary | ICD-10-CM

## 2014-03-15 DIAGNOSIS — Z931 Gastrostomy status: Secondary | ICD-10-CM | POA: Diagnosis not present

## 2014-03-15 DIAGNOSIS — C189 Malignant neoplasm of colon, unspecified: Secondary | ICD-10-CM | POA: Diagnosis present

## 2014-03-15 DIAGNOSIS — J690 Pneumonitis due to inhalation of food and vomit: Secondary | ICD-10-CM | POA: Diagnosis present

## 2014-03-15 DIAGNOSIS — E785 Hyperlipidemia, unspecified: Secondary | ICD-10-CM | POA: Diagnosis present

## 2014-03-15 DIAGNOSIS — C7800 Secondary malignant neoplasm of unspecified lung: Secondary | ICD-10-CM | POA: Diagnosis present

## 2014-03-15 DIAGNOSIS — F17210 Nicotine dependence, cigarettes, uncomplicated: Secondary | ICD-10-CM | POA: Diagnosis present

## 2014-03-15 DIAGNOSIS — T68XXXA Hypothermia, initial encounter: Secondary | ICD-10-CM | POA: Diagnosis present

## 2014-03-15 DIAGNOSIS — Z85038 Personal history of other malignant neoplasm of large intestine: Secondary | ICD-10-CM

## 2014-03-15 DIAGNOSIS — A419 Sepsis, unspecified organism: Secondary | ICD-10-CM | POA: Diagnosis present

## 2014-03-15 DIAGNOSIS — N1339 Other hydronephrosis: Secondary | ICD-10-CM

## 2014-03-15 DIAGNOSIS — J9600 Acute respiratory failure, unspecified whether with hypoxia or hypercapnia: Secondary | ICD-10-CM

## 2014-03-15 DIAGNOSIS — Z1621 Resistance to vancomycin: Secondary | ICD-10-CM | POA: Diagnosis present

## 2014-03-15 DIAGNOSIS — I8510 Secondary esophageal varices without bleeding: Secondary | ICD-10-CM | POA: Diagnosis present

## 2014-03-15 DIAGNOSIS — E871 Hypo-osmolality and hyponatremia: Secondary | ICD-10-CM | POA: Diagnosis present

## 2014-03-15 DIAGNOSIS — I8500 Esophageal varices without bleeding: Secondary | ICD-10-CM | POA: Diagnosis present

## 2014-03-15 DIAGNOSIS — C801 Malignant (primary) neoplasm, unspecified: Secondary | ICD-10-CM

## 2014-03-15 DIAGNOSIS — R059 Cough, unspecified: Secondary | ICD-10-CM

## 2014-03-15 DIAGNOSIS — E1165 Type 2 diabetes mellitus with hyperglycemia: Secondary | ICD-10-CM | POA: Diagnosis present

## 2014-03-15 DIAGNOSIS — K7460 Unspecified cirrhosis of liver: Secondary | ICD-10-CM | POA: Diagnosis present

## 2014-03-15 DIAGNOSIS — Z9119 Patient's noncompliance with other medical treatment and regimen: Secondary | ICD-10-CM | POA: Diagnosis present

## 2014-03-15 DIAGNOSIS — R6520 Severe sepsis without septic shock: Secondary | ICD-10-CM | POA: Diagnosis present

## 2014-03-15 DIAGNOSIS — C7989 Secondary malignant neoplasm of other specified sites: Secondary | ICD-10-CM | POA: Diagnosis present

## 2014-03-15 DIAGNOSIS — B952 Enterococcus as the cause of diseases classified elsewhere: Secondary | ICD-10-CM | POA: Diagnosis present

## 2014-03-15 DIAGNOSIS — N12 Tubulo-interstitial nephritis, not specified as acute or chronic: Secondary | ICD-10-CM | POA: Diagnosis present

## 2014-03-15 DIAGNOSIS — F1020 Alcohol dependence, uncomplicated: Secondary | ICD-10-CM | POA: Diagnosis present

## 2014-03-15 DIAGNOSIS — C799 Secondary malignant neoplasm of unspecified site: Secondary | ICD-10-CM | POA: Diagnosis present

## 2014-03-15 DIAGNOSIS — N1330 Unspecified hydronephrosis: Secondary | ICD-10-CM | POA: Diagnosis present

## 2014-03-15 DIAGNOSIS — J9601 Acute respiratory failure with hypoxia: Secondary | ICD-10-CM

## 2014-03-15 DIAGNOSIS — K8020 Calculus of gallbladder without cholecystitis without obstruction: Secondary | ICD-10-CM | POA: Diagnosis present

## 2014-03-15 DIAGNOSIS — E780 Pure hypercholesterolemia: Secondary | ICD-10-CM | POA: Diagnosis present

## 2014-03-15 DIAGNOSIS — E11649 Type 2 diabetes mellitus with hypoglycemia without coma: Secondary | ICD-10-CM | POA: Diagnosis present

## 2014-03-15 DIAGNOSIS — D649 Anemia, unspecified: Secondary | ICD-10-CM | POA: Diagnosis present

## 2014-03-15 DIAGNOSIS — N400 Enlarged prostate without lower urinary tract symptoms: Secondary | ICD-10-CM | POA: Diagnosis present

## 2014-03-15 DIAGNOSIS — R400 Somnolence: Secondary | ICD-10-CM | POA: Diagnosis present

## 2014-03-15 DIAGNOSIS — T17908A Unspecified foreign body in respiratory tract, part unspecified causing other injury, initial encounter: Secondary | ICD-10-CM

## 2014-03-15 DIAGNOSIS — J449 Chronic obstructive pulmonary disease, unspecified: Secondary | ICD-10-CM | POA: Diagnosis present

## 2014-03-15 DIAGNOSIS — R221 Localized swelling, mass and lump, neck: Secondary | ICD-10-CM

## 2014-03-15 DIAGNOSIS — Z720 Tobacco use: Secondary | ICD-10-CM | POA: Diagnosis present

## 2014-03-15 DIAGNOSIS — R05 Cough: Secondary | ICD-10-CM

## 2014-03-15 DIAGNOSIS — I10 Essential (primary) hypertension: Secondary | ICD-10-CM | POA: Diagnosis present

## 2014-03-15 DIAGNOSIS — E876 Hypokalemia: Secondary | ICD-10-CM | POA: Diagnosis present

## 2014-03-15 DIAGNOSIS — G40909 Epilepsy, unspecified, not intractable, without status epilepticus: Secondary | ICD-10-CM | POA: Diagnosis present

## 2014-03-15 LAB — CBC WITH DIFFERENTIAL/PLATELET
BASOS ABS: 0 10*3/uL (ref 0.0–0.1)
BASOS PCT: 0 % (ref 0–1)
Eosinophils Absolute: 0 10*3/uL (ref 0.0–0.7)
Eosinophils Relative: 0 % (ref 0–5)
HCT: 37 % — ABNORMAL LOW (ref 39.0–52.0)
HEMOGLOBIN: 12.1 g/dL — AB (ref 13.0–17.0)
Lymphocytes Relative: 5 % — ABNORMAL LOW (ref 12–46)
Lymphs Abs: 0.8 10*3/uL (ref 0.7–4.0)
MCH: 23.4 pg — ABNORMAL LOW (ref 26.0–34.0)
MCHC: 32.7 g/dL (ref 30.0–36.0)
MCV: 71.7 fL — ABNORMAL LOW (ref 78.0–100.0)
Monocytes Absolute: 0.4 10*3/uL (ref 0.1–1.0)
Monocytes Relative: 3 % (ref 3–12)
Neutro Abs: 13.7 10*3/uL — ABNORMAL HIGH (ref 1.7–7.7)
Neutrophils Relative %: 92 % — ABNORMAL HIGH (ref 43–77)
Platelets: 461 10*3/uL — ABNORMAL HIGH (ref 150–400)
RBC: 5.16 MIL/uL (ref 4.22–5.81)
RDW: 17.9 % — ABNORMAL HIGH (ref 11.5–15.5)
WBC: 14.9 10*3/uL — AB (ref 4.0–10.5)

## 2014-03-15 LAB — BLOOD GAS, ARTERIAL
ACID-BASE EXCESS: 1.5 mmol/L (ref 0.0–2.0)
BICARBONATE: 25.7 meq/L — AB (ref 20.0–24.0)
FIO2: 100 %
O2 SAT: 77.1 %
Patient temperature: 37
TCO2: 23.8 mmol/L (ref 0–100)
pCO2 arterial: 42 mmHg (ref 35.0–45.0)
pH, Arterial: 7.404 (ref 7.350–7.450)
pO2, Arterial: 44.7 mmHg — ABNORMAL LOW (ref 80.0–100.0)

## 2014-03-15 LAB — BASIC METABOLIC PANEL
ANION GAP: 6 (ref 5–15)
BUN: 12 mg/dL (ref 6–23)
CHLORIDE: 95 meq/L — AB (ref 96–112)
CO2: 30 mmol/L (ref 19–32)
CREATININE: 0.81 mg/dL (ref 0.50–1.35)
Calcium: 8.6 mg/dL (ref 8.4–10.5)
GFR calc Af Amer: >90 mL/min (ref >90)
GFR calc non Af Amer: >90 mL/min (ref >90)
Glucose, Bld: 192 mg/dL — ABNORMAL HIGH (ref 70–99)
Potassium: 4.3 mmol/L (ref 3.5–5.1)
SODIUM: 131 mmol/L — AB (ref 135–145)

## 2014-03-15 LAB — URINALYSIS, ROUTINE W REFLEX MICROSCOPIC
Bilirubin Urine: NEGATIVE
GLUCOSE, UA: 250 mg/dL — AB
Ketones, ur: NEGATIVE mg/dL
NITRITE: NEGATIVE
SPECIFIC GRAVITY, URINE: 1.02 (ref 1.005–1.030)
Urobilinogen, UA: 0.2 mg/dL (ref 0.0–1.0)
pH: 6 (ref 5.0–8.0)

## 2014-03-15 LAB — CBG MONITORING, ED
GLUCOSE-CAPILLARY: 223 mg/dL — AB (ref 70–99)
Glucose-Capillary: 228 mg/dL — ABNORMAL HIGH (ref 70–99)
Glucose-Capillary: 174 mg/dL — ABNORMAL HIGH (ref 70–99)

## 2014-03-15 LAB — PROCALCITONIN: Procalcitonin: 0.17 ng/mL

## 2014-03-15 LAB — URINE MICROSCOPIC-ADD ON

## 2014-03-15 LAB — GLUCOSE, CAPILLARY
GLUCOSE-CAPILLARY: 144 mg/dL — AB (ref 70–99)
GLUCOSE-CAPILLARY: 127 mg/dL — AB (ref 70–99)
GLUCOSE-CAPILLARY: 98 mg/dL (ref 70–99)
Glucose-Capillary: 115 mg/dL — ABNORMAL HIGH (ref 70–99)
Glucose-Capillary: 36 mg/dL — CL (ref 70–99)
Glucose-Capillary: 44 mg/dL — CL (ref 70–99)
Glucose-Capillary: 64 mg/dL — ABNORMAL LOW (ref 70–99)
Glucose-Capillary: 92 mg/dL (ref 70–99)

## 2014-03-15 LAB — MRSA PCR SCREENING: MRSA by PCR: NEGATIVE

## 2014-03-15 LAB — I-STAT CG4 LACTIC ACID, ED: Lactic Acid, Venous: 2.11 mmol/L (ref 0.5–2.2)

## 2014-03-15 MED ORDER — VANCOMYCIN HCL IN DEXTROSE 750-5 MG/150ML-% IV SOLN
INTRAVENOUS | Status: AC
  Filled 2014-03-15: qty 150

## 2014-03-15 MED ORDER — VANCOMYCIN HCL IN DEXTROSE 750-5 MG/150ML-% IV SOLN
750.0000 mg | Freq: Two times a day (BID) | INTRAVENOUS | Status: DC
  Administered 2014-03-16 (×3): 750 mg via INTRAVENOUS
  Filled 2014-03-15 (×6): qty 150

## 2014-03-15 MED ORDER — DEXTROSE 10 % IV SOLN
INTRAVENOUS | Status: DC
  Administered 2014-03-15: 23:00:00 via INTRAVENOUS

## 2014-03-15 MED ORDER — PIPERACILLIN-TAZOBACTAM 3.375 G IVPB
3.3750 g | Freq: Three times a day (TID) | INTRAVENOUS | Status: DC
  Administered 2014-03-15 – 2014-03-17 (×5): 3.375 g via INTRAVENOUS
  Filled 2014-03-15 (×9): qty 50

## 2014-03-15 MED ORDER — CEFEPIME HCL 2 G IJ SOLR
2.0000 g | Freq: Once | INTRAMUSCULAR | Status: AC
  Administered 2014-03-15: 2 g via INTRAVENOUS
  Filled 2014-03-15: qty 2

## 2014-03-15 MED ORDER — PIPERACILLIN-TAZOBACTAM 3.375 G IVPB
INTRAVENOUS | Status: AC
  Filled 2014-03-15: qty 100

## 2014-03-15 MED ORDER — HEPARIN SODIUM (PORCINE) 5000 UNIT/ML IJ SOLN
5000.0000 [IU] | Freq: Three times a day (TID) | INTRAMUSCULAR | Status: DC
  Administered 2014-03-15 – 2014-03-20 (×15): 5000 [IU] via SUBCUTANEOUS
  Filled 2014-03-15 (×14): qty 1

## 2014-03-15 MED ORDER — SODIUM CHLORIDE 0.9 % IV BOLUS (SEPSIS)
1000.0000 mL | INTRAVENOUS | Status: AC
  Administered 2014-03-15 (×2): 1000 mL via INTRAVENOUS

## 2014-03-15 MED ORDER — DEXTROSE-NACL 5-0.9 % IV SOLN: INTRAVENOUS | Status: DC

## 2014-03-15 MED ORDER — VANCOMYCIN HCL IN DEXTROSE 1-5 GM/200ML-% IV SOLN
1000.0000 mg | Freq: Once | INTRAVENOUS | Status: DC
  Filled 2014-03-15: qty 200

## 2014-03-15 MED ORDER — SODIUM CHLORIDE 0.9 % IV SOLN
INTRAVENOUS | Status: DC
  Administered 2014-03-15: 18:00:00 via INTRAVENOUS

## 2014-03-15 MED ORDER — ALBUTEROL (5 MG/ML) CONTINUOUS INHALATION SOLN
10.0000 mg/h | INHALATION_SOLUTION | Freq: Once | RESPIRATORY_TRACT | Status: AC
  Administered 2014-03-15: 10 mg/h via RESPIRATORY_TRACT
  Filled 2014-03-15: qty 20

## 2014-03-15 MED ORDER — GLUCOSE 40 % PO GEL: 1.0000 | Freq: Once | ORAL | Status: AC | PRN

## 2014-03-15 MED ORDER — GLUCAGON HCL RDNA (DIAGNOSTIC) 1 MG IJ SOLR
1.0000 mg | Freq: Once | INTRAMUSCULAR | Status: AC | PRN
  Administered 2014-03-15: 1 mg via INTRAVENOUS
  Filled 2014-03-15: qty 1

## 2014-03-15 NOTE — Progress Notes (Signed)
Name: [name] MRN: [id number] DOB: [birth year]  ELECTRONIC ICU PHYSICIAN NOTE  Problem:  Altered mental status/ resp distress in pt with documented colon ca and widespread met dz ? All related to primary with glucose 38 on admit and wosening RLL as dz and placed on bipap which tolerating poorly but requesting full code status  Intervention:  There is a limit to what medical science can do in this setting and he and fm will need to decide soon whether to keep fighting a battle he can't win or accept a palliative care approach.  For now, nothing else to offer, agree with plans by [facility]   Christinia Gully 03/15/2014, 5:35 PM

## 2014-03-15 NOTE — ED Notes (Signed)
Pt's sats dropped in lower 80's on 2L [place], placed partial non-rebreather on pt, sats in low 90's at this time, will continue to monitor.

## 2014-03-15 NOTE — ED Provider Notes (Signed)
CSN: [id number]     Arrival date & time 03/15/14  1120 History  This chart was scribed for Jim Cable, MD by Stephania Fragmin, ED Scribe. This patient was seen in room APA19/AP[facility]19 and the patient's care was started at 11:44 AM.    Chief Complaint  Patient presents with  . Hypoglycemia    Patient is a 63 y.o. male presenting with hypoglycemia. The history is provided by the patient and the EMS personnel. The history is limited by the condition of the patient. No language interpreter was used.  Hypoglycemia Initial blood sugar:  38 Blood sugar after intervention:  223 Severity:  Severe Associated symptoms: altered mental status   Associated symptoms: no shortness of breath     LEVEL 5 CAVEAT DUE TO AMS  HPI Comments: [name] is a 63 y.o. male brought in by ambulance to the Emergency Department for hypoglycemia. Per EMS, patient was found unresponsive, with his blood sugar measured at 38. After given him oral glucagon, his sugar went up to 220. Patient's O2 saturation was in the 80s, and EMS placed him on a non-rebreather mask. Patient denies headache, chest pain, SOB, and abdominal pain.    IO removed by nursing  Past Medical History  Diagnosis Date  . Diabetes mellitus   . Liver disease   . Enlarged prostate   . Hernia   . Alcohol dependence   . Seizures   . Hypercholesterolemia   . Liver lesion   . Pulmonary nodules   . Cervical mass     biopsy 03/04/14  . Metastatic disease   . Anemia   . Malnutrition   . Esophageal varices   . Hydronephrosis   . Cholelithiasis    Past Surgical History  Procedure Laterality Date  . Appendectomy    . Esophagogastroduodenoscopy N/A 03/05/2014    Procedure: ESOPHAGOGASTRODUODENOSCOPY (EGD);  Surgeon: Jim Dolin, MD;  Location: [facility];  Service: Endoscopy;  Laterality: N/A;  WITH DILATION  . Maloney dilation N/A 03/05/2014    Procedure: Venia Minks DILATION;  Surgeon: Jim Dolin, MD;  Location: [facility]ITE;   Service: Endoscopy;  Laterality: N/A;  . Savory dilation N/A 03/05/2014    Procedure: SAVORY DILATION;  Surgeon: Jim Dolin, MD;  Location: [facility];  Service: Endoscopy;  Laterality: N/A;  . Colonoscopy N/A 03/09/2014    Procedure: COLONOSCOPY;  Surgeon: Jim Binder, MD;  Location: [facility];  Service: Endoscopy;  Laterality: N/A;  . Esophagogastroduodenoscopy N/A 03/09/2014    Procedure: ESOPHAGOGASTRODUODENOSCOPY (EGD);  Surgeon: Jim Binder, MD;  Location: [facility];  Service: Endoscopy;  Laterality: N/A;  . Peg placement N/A 03/09/2014    Procedure: PERCUTANEOUS ENDOSCOPIC GASTROSTOMY (PEG) PLACEMENT;  Surgeon: Jim Binder, MD;  Location: [facility];  Service: Endoscopy;  Laterality: N/A;   Family History  Problem Relation Age of Onset  . Diabetes Mother   . Hypertension Mother   . Cancer Father   . Stomach cancer Father   . Cancer Sister   . Hypertension Other   . Colon cancer Neg Hx    History  Substance Use Topics  . Smoking status: Current Every Day Smoker -- 1.50 packs/day for 40 years    Types: Cigarettes  . Smokeless tobacco: Never Used  . Alcohol Use: 0.0 oz/week    0 Not specified per week     Comment: Drinks "every once in awhile", will drink 1/2 gallon of liqour  Review of Systems  Unable to perform ROS: Mental status change  Respiratory: Negative for shortness of breath.   Cardiovascular: Negative for chest pain.  Gastrointestinal: Negative for abdominal pain.  Neurological: Negative for headaches.      Allergies  Review of patient's allergies indicates no known allergies.  Home Medications   Prior to Admission medications   Medication Sig Start Date End Date Taking? Authorizing Provider  atorvastatin (LIPITOR) 10 MG tablet Take 10 mg by mouth every evening.     Historical Provider, MD  clindamycin (CLEOCIN) 300 MG capsule Take 1 capsule (300 mg total) by mouth 3 (three) times daily. 03/10/14   Jim Cota, MD   ferrous sulfate 325 (65 FE) MG tablet Take 2 tablets (650 mg total) by mouth 2 (two) times daily with a meal. 03/10/14   Jim Cota, MD  folic acid (FOLVITE) 1 MG tablet Take 1 tablet (1 mg total) by mouth daily. 03/10/14   Jim Cota, MD  glipiZIDE (GLUCOTROL) 10 MG tablet Take 10 mg by mouth 2 (two) times daily before a meal.      Historical Provider, MD  levalbuterol (XOPENEX) 0.63 MG/3ML nebulizer solution Take 3 mLs (0.63 mg total) by nebulization 3 (three) times daily. 03/10/14   Jim Cota, MD  levalbuterol Penne Lash) 0.63 MG/3ML nebulizer solution Take 3 mLs (0.63 mg total) by nebulization every 6 (six) hours as needed for wheezing or shortness of breath. 03/10/14   Jim Cota, MD  lisinopril (PRINIVIL,ZESTRIL) 5 MG tablet Take 5 mg by mouth daily.    Historical Provider, MD  nicotine polacrilex (NICORETTE) 2 MG gum Take 1 each (2 mg total) by mouth as needed for smoking cessation. 03/10/14   Jim Cota, MD  pantoprazole (PROTONIX) 40 MG tablet Take 1 tablet (40 mg total) by mouth 2 (two) times daily before a meal. 03/10/14   Jim Cota, MD  polyethylene glycol [facility] / [name]) packet Take 17 g by mouth daily.    Historical Provider, MD   BP 141/83 mmHg  Pulse 105  Temp(Src) 95 F (35 C) (Rectal)  Resp 27  SpO2 81% Physical Exam  Nursing note and vitals reviewed.    CONSTITUTIONAL: Elderly and frail, ill appearing HEAD: Normocephalic/atraumatic ENMT: Mucous membranes moist; copious secretions in mouth NECK: supple no meningeal signs SPINE/BACK:entire spine nontender CV: S1/S2 noted, tachycardic LUNGS: Very coarse breath sounds, tachypneic ABDOMEN: soft, nontender, no rebound or guarding, bowel sounds noted throughout abdomen; peg tube in place, chronic right inguinal hernia GU:no cva tenderness; foley catheter in place on arrival NEURO: Pt is awake/alert/appropriate, moves all extremitiesx4.  However due to secretions very difficult  to obtain history EXTREMITIES: pulses normal/equal, full ROM SKIN: warm, color normal; no sacral wounds noted PSYCH: no abnormalities of mood noted  ED Course  Procedures   CRITICAL CARE Performed by: [name] Total critical care time: 40 Critical care time was exclusive of separately billable procedures and treating other patients. Critical care was necessary to treat or prevent imminent or life-threatening deterioration. Critical care was time spent personally by me on the following activities: development of treatment plan with patient and/or surrogate as well as nursing, discussions with consultants, evaluation of patient's response to treatment, examination of patient, obtaining history from patient or surrogate, ordering and performing treatments and interventions, ordering and review of laboratory studies, ordering and review of radiographic studies, pulse oximetry and re-evaluation of patient's condition. PATIENT HYPOTHERMIC AND HYPOXIC, WHICH IMPROVED IN THE ER  DIAGNOSTIC STUDIES: Oxygen Saturation is 81% on [place], low by my interpretation.    COORDINATION OF CARE: 11:50 AM - Treatment plan includes CXR. 12:46 PM Pt worsening with hypoxia.  He has significant oral secretions.  Suctioning ordered as well ABG and also nebulized treatment He has h/o widespread metastatic disease.  He is very ill at this time.  Will follow closely 2:06 PM PT HAD SIGNIFICANT HYPOXIA CONFIRMED BY ABG THAT IMPROVED AFTER SUCTIONING/COUGHING HIS VITALS ARE IMPROVED AND HIS HYPOXIA IS IMPROVED HE IS AWAKE/ALERT.  WHEN ASKED ABOUT CODE STATUS, HE COULD LIKE TO BE INTUBATED IF NECESSARY.  IT APPEARS HIS CANCER IS SEVERE BUT IS NOT HAD ACTIVE TREATMENT AS OF YET HE IS NOTED TO HAVE PNEUMONIA - HEALTHCARE ASSOCIATED DUE TO RECENT ADMISSION WILL ADMIT D/W DR Heywood Iles ICU ADMISSION BP 119/71 mmHg  Pulse 97  Temp(Src) 95 F (35 C) (Rectal)  Resp 27  SpO2 100%   Medications  sodium  chloride 0.9 % bolus 1,000 mL (1,000 mLs Intravenous New Bag/Given 03/15/14 1330)  vancomycin (VANCOCIN) IVPB 1000 mg/200 mL premix (1,000 mg Intravenous Restarted 03/15/14 1323)  ceFEPIme (MAXIPIME) 2 g in dextrose 5 % 50 mL IVPB (0 g Intravenous Stopped 03/15/14 1340)  albuterol (PROVENTIL,VENTOLIN) solution continuous neb (10 mg/hr Nebulization Given 03/15/14 1258)     Labs Review Labs Reviewed  BASIC METABOLIC PANEL - Abnormal; Notable for the following:    Sodium 131 (*)    Chloride 95 (*)    Glucose, Bld 192 (*)    All other components within normal limits  CBC WITH DIFFERENTIAL - Abnormal; Notable for the following:    WBC 14.9 (*)    Hemoglobin 12.1 (*)    HCT 37.0 (*)    MCV 71.7 (*)    MCH 23.4 (*)    RDW 17.9 (*)    Platelets 461 (*)    Neutrophils Relative % 92 (*)    Neutro Abs 13.7 (*)    Lymphocytes Relative 5 (*)    All other components within normal limits  URINALYSIS, ROUTINE W REFLEX MICROSCOPIC - Abnormal; Notable for the following:    Color, Urine AMBER (*)    APPearance CLOUDY (*)    Glucose, UA 250 (*)    Hgb urine dipstick LARGE (*)    Protein, ur TRACE (*)    Leukocytes, UA TRACE (*)    All other components within normal limits  BLOOD GAS, ARTERIAL - Abnormal; Notable for the following:    pO2, Arterial 44.7 (*)    Bicarbonate 25.7 (*)    All other components within normal limits  URINE MICROSCOPIC-ADD ON - Abnormal; Notable for the following:    Crystals CA OXALATE CRYSTALS (*)    All other components within normal limits  CBG MONITORING, ED - Abnormal; Notable for the following:    Glucose-Capillary 223 (*)    All other components within normal limits  CBG MONITORING, ED - Abnormal; Notable for the following:    Glucose-Capillary 228 (*)    All other components within normal limits  CULTURE, BLOOD (ROUTINE X 2)  CULTURE, BLOOD (ROUTINE X 2)  URINE CULTURE  PROCALCITONIN  CBG MONITORING, ED  I-STAT CG4 LACTIC ACID, ED  CBG MONITORING, ED     Imaging Review Dg Chest Portable 1 View  03/15/2014   CLINICAL DATA:  Unresponsive this morning  EXAM: PORTABLE CHEST - 1 VIEW  COMPARISON:  03/02/2014  FINDINGS: Right hemidiaphragm remains elevated. Heterogeneous opacities have increased at the right  lung base. Vascular congestion. No Kerley B-lines. Right infrahilar nodular density persists.  IMPRESSION: Increasing right lower lobe airspace disease.  Vascular congestion has developed.   Electronically Signed   By: Maryclare Bean M.D.   On: 03/15/2014 12:27     EKG Interpretation   Date/Time:  Sunday March 15 2014 11:29:43 EST Ventricular Rate:  104 PR Interval:  161 QRS Duration: 85 QT Interval:  327 QTC Calculation: 430 R Axis:   71 Text Interpretation:  Sinus tachycardia Biatrial enlargement Borderline  low voltage, extremity leads Consider anterior infarct artifact noted No  significant change since last tracing Confirmed by Christy Gentles  MD, Elenore Rota  [phone]) on 03/15/2014 11:43:05 AM      MDM   Final diagnoses:  Cough  HCAP (healthcare-associated pneumonia)  Hypoglycemia  Hypothermia, initial encounter     Nursing notes including past medical history and social history reviewed and considered in documentation Labs/vital reviewed myself and considered during evaluation Previous records reviewed and considered    I personally performed the services described in this documentation, which was scribed in my presence. The recorded information has been reviewed and is accurate.     Jim Cable, MD 03/15/14 [phone]

## 2014-03-15 NOTE — Progress Notes (Signed)
[place] for Vancomycin & Zosyn Indication: pneumonia  No Known Allergies  Patient Measurements:   Last Recorded Body Weight: 52.4kg  Vital Signs: Temp: 97.7 F (36.5 C) (12/27 1709) Temp Source: Rectal (12/27 1709) BP: 114/84 mmHg (12/27 1530) Pulse Rate: 101 (12/27 1627) Intake/Output from previous day:   Intake/Output from this shift:    Labs:  Recent Labs  03/15/14 1140  WBC 14.9*  HGB 12.1*  PLT 461*  CREATININE 0.81   Estimated Creatinine Clearance: 67.4 mL/min (by C-G formula based on Cr of 0.81). No results for input(s): VANCOTROUGH, VANCOPEAK, VANCORANDOM, GENTTROUGH, GENTPEAK, GENTRANDOM, TOBRATROUGH, TOBRAPEAK, TOBRARND, AMIKACINPEAK, AMIKACINTROU, AMIKACIN in the last 72 hours.   Microbiology: Recent Results (from the past 720 hour(s))  Culture, blood (routine x 2) Call MD if unable to obtain prior to antibiotics being given     Status: None   Collection Time: 03/02/14 11:23 PM  Result Value Ref Range Status   Specimen Description BLOOD RIGHT ANTECUBITAL  Final   Special Requests BOTTLES DRAWN AEROBIC AND ANAEROBIC [place]  Final   Culture NO GROWTH 5 DAYS  Final   Report Status 03/07/2014 FINAL  Final  Culture, blood (routine x 2) Call MD if unable to obtain prior to antibiotics being given     Status: None   Collection Time: 03/02/14 11:23 PM  Result Value Ref Range Status   Specimen Description BLOOD RIGHT ANTECUBITAL  Final   Special Requests   Final    BOTTLES DRAWN AEROBIC AND ANAEROBIC AEB=6CC ANA=4CC   Culture  Setup Time   Final    03/04/2014 14:50 Performed at [facility]    Culture   Final    STAPHYLOCOCCUS SPECIES (COAGULASE NEGATIVE) Note: THE SIGNIFICANCE OF ISOLATING THIS ORGANISM FROM A SINGLE VENIPUNCTURE CANNOT BE PREDICTED WITHOUT FURTHER CLINICAL AND CULTURE CORRELATION. SUSCEPTIBILITIES AVAILABLE ONLY ON REQUEST. Note: Gram Stain Report Called to,Read Back By and Verified With: THOMAS  [id number]  ON 03/03/14 BY FORSYTHK Performed at [facility] Performed at [facility]    Report Status 03/05/2014 FINAL  Final  MRSA PCR Screening     Status: None   Collection Time: 03/03/14 12:10 AM  Result Value Ref Range Status   MRSA by PCR NEGATIVE NEGATIVE Final    Comment:        The GeneXpert MRSA Assay (FDA approved for NASAL specimens only), is one component of a comprehensive MRSA colonization surveillance program. It is not intended to diagnose MRSA infection nor to guide or monitor treatment for MRSA infections.   Clostridium Difficile by PCR     Status: None   Collection Time: 03/06/14  3:20 PM  Result Value Ref Range Status   C difficile by pcr NEGATIVE NEGATIVE Final  Blood Culture (routine x 2)     Status: None (Preliminary result)   Collection Time: 03/15/14 12:42 PM  Result Value Ref Range Status   Specimen Description BLOOD LEFT HAND  Final   Special Requests BOTTLES DRAWN AEROBIC AND ANAEROBIC 8CC BOTTLES  Final   Culture PENDING  Incomplete   Report Status PENDING  Incomplete  Blood Culture (routine x 2)     Status: None (Preliminary result)   Collection Time: 03/15/14 12:42 PM  Result Value Ref Range Status   Specimen Description BLOOD RIGHT HAND DRAWN BY RN  Final   Special Requests BOTTLES DRAWN AEROBIC AND ANAEROBIC 6CC BOTTLES  Final   Culture PENDING  Incomplete   Report Status PENDING  Incomplete    Anti-infectives    Start     Dose/Rate Route Frequency Ordered Stop   03/15/14 1245  ceFEPIme (MAXIPIME) 2 g in dextrose 5 % 50 mL IVPB     2 g100 mL/hr over 30 Minutes Intravenous  Once 03/15/14 1235 03/15/14 1340   03/15/14 1245  vancomycin (VANCOCIN) IVPB 1000 mg/200 mL premix     1,000 mg200 mL/hr over 60 Minutes Intravenous  Once 03/15/14 1235        Assessment: 63 yo M who was hospitalized~ 1 week ago for PNA.  He represents with acute respiratory failure & worsened CXR. He is hypothermic with elevated WBC.   Renal function  is at patient's baseline.  Asked to initiate empiric, broad spectrum antibiotics for aspiration vs. HCAP. Initial doses given in ED.  Vancomycin 12/27>> Zosyn 12/27  Goal of Therapy:  Vancomycin trough level 15-20 mcg/ml  Plan:  Zosyn 3.375gm IV Q8h to be infused over 4hrs Vancomycin 750mg  IV q12h Check Vancomycin trough at steady state Monitor renal function and cx data   Biagio Borg 03/15/2014,5:22 PM

## 2014-03-15 NOTE — ED Notes (Signed)
Respiratory at bedside.

## 2014-03-15 NOTE — ED Notes (Signed)
Pt unable to wear full bi-pap mask due to full beard, RT will have to find a nose mask.

## 2014-03-15 NOTE — ED Notes (Signed)
cbg 223 in ed

## 2014-03-15 NOTE — ED Notes (Signed)
Pt able to have an effective cough, sats improved to 93% 15L partial non-rebreather

## 2014-03-15 NOTE — ED Notes (Signed)
Pt's sats still between 73-75% on 15L partial non-rebreather, EDP aware, awaiting blood gas results.

## 2014-03-15 NOTE — ED Notes (Signed)
Vancomycin started first but immediately paused for maxipime to be given first.

## 2014-03-15 NOTE — ED Notes (Signed)
EMS reports  Was called out for unresponsive diabetic.  Reports ekg normal, cbg 38 initially.   EMS gave IM glucagon and and cbg increased 46.  EMS unable to obtain IV access, so inserted IO in left lower leg.  EMS adminsitered 1 amp of D50 and administered 76ml of 2% lidocaine prior to pushing the dextrose.  EMS reports pt now more responsive, opening eyes.

## 2014-03-15 NOTE — ED Notes (Signed)
Pt cold to touch on arrival to ED, rectal temp 95, bair hugger warming blanket applied. Pt incontinent of stool on arrival, pt cleaned and ready for assessment. Pt has shallow respirations, more alert at the moment.

## 2014-03-15 NOTE — H&P (Signed)
[facility] History and Physical  [name] QAS:[id number] DOB: 06/23/1950 DOA: 03/15/2014  Referring physician: ED PCP: [name], MD  Specialists:   Chief Complaint: multiple  HPI:  63 y/o ? Recent admission 12/14-02/2214 for aspiration pneumonia in the setting of choking. 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 He was also transfused during the hospital stay  History is somewhat sparse as patient is a difficult historian secondary to lack of teeth however I did speak to his sister Mrs. [name] at phone [phone]. Patient lives with her as well as another brother. Apparently this morning patient was extremely sleepy and somnolent and when his brother who is his roommate went to church his sister tried to wake him up and he was unarousable. His blood glucose per her check was 46 and she gave him orange juice and immediately call 911. She tells me that he he has never received his tube feeds for his PEG tube and he is not compliant on a dysphagia 1 diet and has been eating regular foods. He has not been eating much overall. Home health has come out to assess the patient however there was an issue with supply of the feeds apparently he In either event, patient  came to the emergency room and O2 sats dropped into the 70s on 2 L of nasal cannula and patient was placed on a partial nonrebreather. Subsequently with nonrebreather at 15 L sats improved to 93% but with movement he desats to the 89-88% He was also found to be hypothermic at 35. Bear hugger was placed on. EMS truck rechecked her sugar was 38 and he was given glucagon sugar went up to 220. Lactic acid 2.11, PaO2 44.7 on 100% FiO2, pH 7.4 P CXR showed ?  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 Diabetes mellitus   . Liver disease   . Enlarged prostate   . Hernia   . Alcohol dependence   . Seizures   . Hypercholesterolemia   . Liver lesion   . Pulmonary nodules   . Cervical mass     biopsy 03/04/14  . Metastatic disease   . Anemia   . Malnutrition   . Esophageal varices   . Hydronephrosis   . Cholelithiasis    Past Surgical History  Procedure Laterality Date  . Appendectomy    . Esophagogastroduodenoscopy N/A 03/05/2014    Procedure: ESOPHAGOGASTRODUODENOSCOPY (EGD);  Surgeon: Daneil Dolin, MD;  [facility];  Service: Endoscopy;  Laterality: N/A;  WITH  DILATION  . [place] dilation N/A 03/05/2014    Procedure: Venia Minks DILATION;  Surgeon: Daneil Dolin, MD;  [facility];  Service: Endoscopy;  Laterality: N/A;  . Savory dilation N/A 03/05/2014    Procedure: SAVORY DILATION;  Surgeon: Daneil Dolin, MD;  [facility];  Service: Endoscopy;  Laterality: N/A;  . Colonoscopy N/A 03/09/2014    Procedure: COLONOSCOPY;  Surgeon: Danie Binder, MD;  [facility];  Service: Endoscopy;  Laterality:  N/A;  . Esophagogastroduodenoscopy N/A 03/09/2014    Procedure: ESOPHAGOGASTRODUODENOSCOPY (EGD);  Surgeon: Danie Binder, MD;  [facility];  Service: Endoscopy;  Laterality: N/A;  . Peg placement N/A 03/09/2014    Procedure: PERCUTANEOUS ENDOSCOPIC GASTROSTOMY (PEG) PLACEMENT;  Surgeon: Danie Binder, MD;  [facility];  Service: Endoscopy;  Laterality: N/A;   Social History:  History   Social History Narrative    No Known Allergies  Family History  Problem Relation Age of Onset  . Diabetes Mother   . Hypertension Mother   . Cancer Father   . Stomach cancer Father   . Cancer Sister   . Hypertension Other   . Colon cancer Neg Hx     Prior to Admission medications   Medication Sig Start Date End Date Taking? Authorizing Provider  atorvastatin (LIPITOR) 10 MG tablet Take 10 mg by mouth every evening.     Historical Provider, MD  clindamycin (CLEOCIN) 300 MG capsule Take 1 capsule (300 mg total) by mouth 3 (three) times daily. 03/10/14   Samuella Cota, MD  ferrous sulfate 325 (65 FE) MG tablet Take 2 tablets (650 mg total) by mouth 2 (two) times daily with a meal. 03/10/14   Samuella Cota, MD  folic acid (FOLVITE) 1 MG tablet Take 1 tablet (1 mg total) by mouth daily. 03/10/14   Samuella Cota, MD  glipiZIDE (GLUCOTROL) 10 MG tablet Take 10 mg by mouth 2 (two) times daily before a meal.      Historical Provider, MD  levalbuterol (XOPENEX) 0.63 MG/3ML nebulizer solution Take 3 mLs (0.63 mg total) by nebulization 3 (three) times daily. 03/10/14   Samuella Cota, MD  levalbuterol Penne Lash) 0.63 MG/3ML nebulizer solution Take 3 mLs (0.63 mg total) by nebulization every 6 (six) hours as needed for wheezing or shortness of breath. 03/10/14   Samuella Cota, MD  lisinopril (PRINIVIL,ZESTRIL) 5 MG tablet Take 5 mg by mouth daily.    Historical Provider, MD  nicotine polacrilex (NICORETTE) 2 MG gum Take 1 each (2 mg total) by mouth as needed for  smoking cessation. 03/10/14   Samuella Cota, MD  pantoprazole (PROTONIX) 40 MG tablet Take 1 tablet (40 mg total) by mouth 2 (two) times daily before a meal. 03/10/14   Samuella Cota, MD  polyethylene glycol [facility] Floria Raveling) packet Take 17 g by mouth daily.    Historical Provider, MD   Physical Exam: Filed Vitals:   03/15/14 1333 03/15/14 1334 03/15/14 1335 03/15/14 1340  BP:  119/71    Pulse: 102 100 100 97  Temp:      TempSrc:      Resp: 31 26 25 27   SpO2: 93% 98% 100% 100%     General:  EOMI NCAT  Eyes: See above  ENT: Hard nodular mass under left-sided mandible, 4 cm ovoid  Neck: No JVD  Cardiovascular: S1-S2 tachycardic  Respiratory: Increased work of breathing, accessory muscles respiration in use.  Abdomen: Soft nontender, PEG tube in place and appears clean dressing, large area in left testes consistent with hernia  Skin: No lower extremity edema  Musculoskeletal: Range of motion intact  Psychiatric: Confused  Neurologic: Grossly intact moving all 4 limbs equally  Labs on Admission:  Basic Metabolic Panel:  Recent Labs Lab 03/09/14 0606 03/10/14 0608 03/15/14 1140  NA 134* 129* 131*  K 4.8 4.6 4.3  CL 96 94* 95*  CO2 25 29 30   GLUCOSE 100* 270* 192*  BUN 4* 5* 12  CREATININE 0.67 0.64 0.81  CALCIUM 9.0 8.5 8.6   Liver Function Tests:  Recent Labs Lab 03/09/14 0606  AST 12  ALT 6  ALKPHOS 164*  BILITOT 0.3  PROT 7.3  ALBUMIN 2.5*   No results for input(s): LIPASE, AMYLASE in the last 168 hours. No results for input(s): AMMONIA in the last 168 hours. CBC:  Recent Labs Lab 03/09/14 0606 03/10/14 0608 03/15/14 1140  WBC 15.1* 13.2* 14.9*  NEUTROABS  --   --  13.7*  HGB 12.2* 12.0* 12.1*  HCT 37.8* 37.2* 37.0*  MCV 71.6* 71.8* 71.7*  PLT 409* 408* 461*   Cardiac Enzymes: No results for input(s): CKTOTAL, CKMB, CKMBINDEX, TROPONINI in the last 168 hours.  BNP (last 3 results) No results for input(s): PROBNP in the  last 8760 hours. CBG:  Recent Labs Lab 03/09/14 2211 03/10/14 0747 03/10/14 1138 03/15/14 1127 03/15/14 1252  GLUCAP 226* 261* 187* 223* 228*    Radiological Exams on Admission: Dg Chest Portable 1 View  03/15/2014   CLINICAL DATA:  Unresponsive this morning  EXAM: PORTABLE CHEST - 1 VIEW  COMPARISON:  03/02/2014  FINDINGS: Right hemidiaphragm remains elevated. Heterogeneous opacities have increased at the right lung base. Vascular congestion. No Kerley B-lines. Right infrahilar nodular density persists.  IMPRESSION: Increasing right lower lobe airspace disease.  Vascular congestion has developed.   Electronically Signed   By: Maryclare Bean M.D.   On: 03/15/2014 12:27    EKG: Independently reviewed. 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 He has poor overall prognosis and I have made him aware of this as well as his sister. We will admit him overnight to ICU with when necessary BiPAP orders and I would hesitate to intubate him as I do not think we will be able to safely eventually extubate him. I will continue this discussion with him and his sister tomorrow Active Problems:   Aspiration pneumonia-his pneumonia on chest x-ray looks worse however I'm not sure if this is pneumonia versus metastatic colon cancer to the lung. He is not a candidate for chemotherapy. We'll empirically cover him with IV Zosyn priorly. It is noted that patient completed 8 full days of aspiration coverage on 12/22. I do not believe pulmonary would have anything different to offer   Hypokalemia-resolved   Tobacco abuse-still smoker smokes occasionally.    Cirrhosis-likely secondary to metastatic colon cancer.   Neck mass-see above discussion   Metastatic disease-patient is hospice eligible we will continue these discussions in the morning. Patient however requests full CODE STATUS   Esophageal varices currently stable at present time.  Relative contraindication to nonselective beta blocker because of mild  hypertension   Hydronephrosis-continue Foley.   HCAP (healthcare-associated pneumonia)-see above discussion CRITICAL CARE Performed by: Nita Sells   Total critical care time: 85 minutes  Critical care time was exclusive of separately billable procedures and treating other patients.  Critical care was necessary to treat or prevent imminent or life-threatening deterioration.  Critical care was time spent personally by me on the following activities: development of treatment plan with patient and/or surrogate as well as nursing, discussions with consultants, evaluation of patient's response to treatment, examination of patient, obtaining history from patient or surrogate, ordering and performing treatments and interventions, ordering and review of laboratory studies, ordering and review of radiographic studies, pulse oximetry and re-evaluation of patient's condition.    Rin Gorton, [facility]  I had a long discussion with the patient and then with the sister believe this represents futile care however I think it is reasonable to keep him in the ICU overnight and see what direction he turns in terms of clinical condition. If he does not improve significantly, we will have discussions tomorrow afternoon about hospice and palliative care   If 7PM-7AM, please contact night-coverage [email or web address] Password [email or web address] 03/15/2014, 2:02 PM

## 2014-03-15 NOTE — Progress Notes (Signed)
Hypoglycemic Event  CBG: 35  Treatment: 1 amp D50  Symptoms: none  Follow-up CBG: [id number] CBG Result:pending  Possible Reasons for Event: NPO, sepsis  Comments/MD notified:Dr Samtani. IVF changed to D5 1/2 NS    [name]  Remember to initiate Hypoglycemia Order Set & complete

## 2014-03-15 NOTE — ED Notes (Signed)
Pt's sats remain at 100% on partial non-rebreather 15L O2

## 2014-03-16 ENCOUNTER — Inpatient Hospital Stay (HOSPITAL_COMMUNITY): Payer: Medicare Other

## 2014-03-16 DIAGNOSIS — K7030 Alcoholic cirrhosis of liver without ascites: Secondary | ICD-10-CM

## 2014-03-16 LAB — GLUCOSE, CAPILLARY
GLUCOSE-CAPILLARY: 89 mg/dL (ref 70–99)
GLUCOSE-CAPILLARY: 95 mg/dL (ref 70–99)
GLUCOSE-CAPILLARY: 271 mg/dL — AB (ref 70–99)
Glucose-Capillary: 117 mg/dL — ABNORMAL HIGH (ref 70–99)
Glucose-Capillary: 124 mg/dL — ABNORMAL HIGH (ref 70–99)
Glucose-Capillary: 150 mg/dL — ABNORMAL HIGH (ref 70–99)
Glucose-Capillary: 181 mg/dL — ABNORMAL HIGH (ref 70–99)
Glucose-Capillary: 209 mg/dL — ABNORMAL HIGH (ref 70–99)
Glucose-Capillary: 212 mg/dL — ABNORMAL HIGH (ref 70–99)
Glucose-Capillary: 217 mg/dL — ABNORMAL HIGH (ref 70–99)
Glucose-Capillary: 25 mg/dL — CL (ref 70–99)
Glucose-Capillary: 75 mg/dL (ref 70–99)
Glucose-Capillary: 82 mg/dL (ref 70–99)

## 2014-03-16 LAB — BASIC METABOLIC PANEL
Anion gap: 7 (ref 5–15)
BUN: 9 mg/dL (ref 6–23)
CALCIUM: 8 mg/dL — AB (ref 8.4–10.5)
CO2: 26 mmol/L (ref 19–32)
CREATININE: 0.74 mg/dL (ref 0.50–1.35)
Chloride: 100 mEq/L (ref 96–112)
GFR calc Af Amer: >90 mL/min (ref >90)
GFR calc non Af Amer: >90 mL/min (ref >90)
GLUCOSE: 134 mg/dL — AB (ref 70–99)
Potassium: 3.5 mmol/L (ref 3.5–5.1)
Sodium: 133 mmol/L — ABNORMAL LOW (ref 135–145)

## 2014-03-16 LAB — PROCALCITONIN: PROCALCITONIN: 5.7 ng/mL

## 2014-03-16 MED ORDER — CETYLPYRIDINIUM CHLORIDE 0.05 % MT LIQD
7.0000 mL | Freq: Two times a day (BID) | OROMUCOSAL | Status: DC
  Administered 2014-03-16 – 2014-03-20 (×9): 7 mL via OROMUCOSAL

## 2014-03-16 MED ORDER — NICOTINE 14 MG/24HR TD PT24
14.0000 mg | MEDICATED_PATCH | Freq: Every day | TRANSDERMAL | Status: DC
  Administered 2014-03-16 – 2014-03-20 (×5): 14 mg via TRANSDERMAL
  Filled 2014-03-16 (×5): qty 1

## 2014-03-16 MED ORDER — LEVALBUTEROL HCL 0.63 MG/3ML IN NEBU: 0.6300 mg | INHALATION_SOLUTION | Freq: Four times a day (QID) | RESPIRATORY_TRACT | Status: DC | PRN

## 2014-03-16 NOTE — Plan of Care (Signed)
Problem: ICU Phase Progression Outcomes Goal: O2 sats trending toward baseline Outcome: Progressing Pt off bipap on [place]. O2 sats upper 90's

## 2014-03-16 NOTE — Care Management Note (Addendum)
    Page 1 of 2   03/19/2014     3:06:11 PM CARE MANAGEMENT NOTE 03/19/2014  Patient:  [name], [name]   Account Number:  [id number]  Date Initiated:  03/16/2014  Documentation initiated by:  CHILDRESS,JESSICA  Subjective/Objective Assessment:   Pt admitted with resp failure. Pt is from home with family. Pt active with [facility]. Pt plans to discharge home with resumption of services. Will continue to follow for CM needs.     Action/Plan:   Anticipated DC Date:  03/20/2014   Anticipated DC Plan:  [place]  CM consult      PAC Choice  [place]   Choice offered to / List presented to:  C-1 Patient   DME arranged  TUBE FEEDING      DME agency  [place] arranged  HH-1 RN      [facility] agency  [facility]   Status of service:  In process, will continue to follow Medicare Important Message given?  YES (If response is "NO", the following Medicare IM given date fields will be blank) Date Medicare IM given:  03/19/2014 Medicare IM given by:  Vladimir Creeks Date Additional Medicare IM given:   Additional Medicare IM given by:    Discharge Disposition:  [place]  Per UR Regulation:  Reviewed for med. necessity/level of care/duration of stay  If discussed at [place] of Stay Meetings, dates discussed:    Comments:  03/19/14 [place] RN/CM Family did not come for conference yesterday, came today but did not wait in room until MD came to room. Left room and apparent left hospital. MD will D/C pt when stable and have Hospice see pt at home and attempt to speak to family there. Will also resume HH until Hospice picks up pt as theirs, if they do this. 03/18/14 [place], RN BSN CM Family, pt and Dr. Verlon Au to have family conference. If pt/family decides to d/c with Hospice, will make referral. If pt decides to d/c home with resumption of  [name] will notify them of resumption of HH. Pt also receives tube feeding supplies from [facility]. Will continue to follow.  03/16/2014 [place], RN, MSN, [facility]

## 2014-03-16 NOTE — Progress Notes (Signed)
[name] MAU:[id number] DOB: 02-17-1951 DOA: 03/15/2014 PCP: [name], MD  Brief narrative: 63 y/o ? Recent admission 12/14-02/2214 for aspiration pneumonia in the setting of choking. H/o failure to thrive, seizure disorder, hypertension, diabetes mellitus uncontrolled, hyperlipidemia, continued tobacco abuse, liver cirrhosis, malnutrition Prior hospital stay  CT of the chest showed pulmonary nodules and CT neck revealed advanced latency of the neck-cervical lymph node biopsy 12/16 = squamous cell CA EGD 12/17 = none bleeding dysphagia varices grade 2 Colonoscopy 12/21 revealed near obstructing cecal mass and she'll subsequently placed for nutritional support Admitted to step down unit 12/27 with profound hypoglycemia in the 30s Presented to the emergency room and desatted to the  60 percentile and needed to be aggressively managed with BiPAP also found to be hypothermic at 35 and placed on Bair hugger Initial labs showed lactic acidosis 2.1, pro calcitonin 5 PaO2 44 on 100% oxygen FiO2 and pH 7.4 Portal chest x-ray = right airspace disease versus consolidation  Past medical history-As per Problem list Chart reviewed as below- reviewed  Consultants:  pulmonary  Procedures:  none  Antibiotics:  IV Zosyn 12/27   Subjective  Looks much improved Talking a lot clearer and making more sense Does not complain of any pain anywhere Defers decision making to his sister   Objective    Interim History: none  Telemetry: Normal sinus rhythm 80s   Objective: Filed Vitals:   03/16/14 0730 03/16/14 0800 03/16/14 0815 03/16/14 0830  BP: 118/69 112/67 125/67 123/59  Pulse: 87 88 90 86  Temp:  98.4 F (36.9 C)    TempSrc:  Core (Comment)    Resp: 19 21 21 20   Weight:      SpO2: 100% 100% 100% 100%    Intake/Output Summary (Last 24 hours) at 03/16/14 5638 Last data filed at 03/16/14 0500  Gross per 24 hour  Intake  187.5 ml  Output    950 ml  Net -762.5 ml     Exam:  General: alert to some extent unclear how oriented he has. Facial droop noted on left side. XL  Exostosis of the left eye, Cardiovascular: S1-S2 no murmur rub or gallop Respiratory: clinically clear Abdomen: soft no rebound no guarding large scrotal hernia, high tube in place [facility] lower extremity edema Neurointact  Data Reviewed: Basic Metabolic Panel:  Recent Labs Lab 03/10/14 0608 03/15/14 1140 03/16/14 0514  NA 129* 131* 133*  K 4.6 4.3 3.5  CL 94* 95* 100  CO2 29 30 26   GLUCOSE 270* 192* 134*  BUN 5* 12 9  CREATININE 0.64 0.81 0.74  CALCIUM 8.5 8.6 8.0*   Liver Function Tests: No results for input(s): AST, ALT, ALKPHOS, BILITOT, PROT, ALBUMIN in the last 168 hours. No results for input(s): LIPASE, AMYLASE in the last 168 hours. No results for input(s): AMMONIA in the last 168 hours. CBC:  Recent Labs Lab 03/10/14 0608 03/15/14 1140  WBC 13.2* 14.9*  NEUTROABS  --  13.7*  HGB 12.0* 12.1*  HCT 37.2* 37.0*  MCV 71.8* 71.7*  PLT 408* 461*   Cardiac Enzymes: No results for input(s): CKTOTAL, CKMB, CKMBINDEX, TROPONINI in the last 168 hours. BNP: Invalid input(s): POCBNP CBG:  Recent Labs Lab 03/16/14 0159 03/16/14 0356 03/16/14 0611 03/16/14 0750 03/16/14 0900  GLUCAP 150* 117* 124* 89 82    Recent Results (from the past 240 hour(s))  Clostridium Difficile by PCR     Status: None   Collection Time: 03/06/14  3:20 PM  Result  Value Ref Range Status   C difficile by pcr NEGATIVE NEGATIVE Final  Blood Culture (routine x 2)     Status: None (Preliminary result)   Collection Time: 03/15/14 12:42 PM  Result Value Ref Range Status   Specimen Description BLOOD LEFT HAND  Final   Special Requests BOTTLES DRAWN AEROBIC AND ANAEROBIC 8CC BOTTLES  Final   Culture PENDING  Incomplete   Report Status PENDING  Incomplete  Blood Culture (routine x 2)     Status: None (Preliminary result)   Collection Time: 03/15/14 12:42 PM  Result Value Ref Range  Status   Specimen Description BLOOD RIGHT HAND DRAWN BY RN  Final   Special Requests BOTTLES DRAWN AEROBIC AND ANAEROBIC 6CC BOTTLES  Final   Culture PENDING  Incomplete   Report Status PENDING  Incomplete  MRSA PCR Screening     Status: None   Collection Time: 03/15/14  5:19 PM  Result Value Ref Range Status   MRSA by PCR NEGATIVE NEGATIVE Final    Comment:        The GeneXpert MRSA Assay (FDA approved for NASAL specimens only), is one component of a comprehensive MRSA colonization surveillance program. It is not intended to diagnose MRSA infection nor to guide or monitor treatment for MRSA infections.      Studies:              All Imaging reviewed and is as per above notation   Scheduled Meds: . heparin  5,000 Units Subcutaneous 3 times per day  . piperacillin-tazobactam (ZOSYN)  IV  3.375 g Intravenous Q8H  . vancomycin  750 mg Intravenous Q12H   Continuous Infusions: . dextrose 50 mL/hr at 03/16/14 0300     Assessment/Plan: 1. Acute respiratory failure-probably secondary to aspiration pneumonia.  noncompliant with feeding tube feeds. Nutritionist to see patient today to help initiate this in the hospital. Case management to be made aware of lack of outpatient supplies. Weaned to nasal cannula oxygen 12/28-observe.   We'll repeat 2 view x-ray to determine if this is pneumonic. 2.  hyponatremia - probably secondary to  Very poor by mouth intake. Improving slightly 3. Potential aspiration pneumonia versus metastatic lung disease from:/Neck-continue Zosyn IV every 8 hourly for now, speech therapy input needed to determine if patient can safely eat 4. Cirrhosis-probably secondary to metastatic colon cancer 5.  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 I'm scheduled to have a meeting with his sister today in the afternoon and will address overall concerns than He is hospice eligible and we will look into this after  family meeting  Code Status: currently full code Family Communication: none at bedside Disposition Plan: inpatient   Verneita Griffes, MD  [facility] Pager [phone] 03/16/2014, 9:52 AM    LOS: 1 day

## 2014-03-16 NOTE — Progress Notes (Signed)
NUTRITION FOLLOW UP  Intervention:   Follow for nutrition plan of care.  If TF desired recommend: Bolus feeding Jevity 1.2 (474 ml ) TID add 120 ml flushes if no IV fluids. Provides: 1710 kcal, 79 gr protein and 1146 ml of water and 1506 ml daily if flushes are added.  Nutrition Dx:   Inadequate oral intake related to dysphagia? as evidenced by severe wasting clavicles, patella, quadricept regions, 15% wt loss x 7 months; ongoing  Goal:  Pt to meet >/= 90% of their estimated nutrition needs; progressing  Monitor:   Protein-energy intake, labs and wt trends   Assessment:   Pt lives with his sister. He has swallow difficulty and limited solid food intake. hx of alcohol abuse, liver disease and diabetes, FTT and malnutrition. [place] of abdomen and . EGD on 12/17 , Colonoscopy 12/21 -cecal mass.  RD received consult for supplement and TF recommendations 12/22 which were provided. Pt was last seen on 03/09/14. Pt s/p TCS for tissue diagnosis and PEG placement on 03/09/14. Per MD notes, suspect multiple malignancies. Treatment of hospice vs chemotherapy being considered.  Pt re-admitted 12/27 Poor prognosis per MD to discuss possible hospice with family. His diet has been resumed Dys 1 with thin liquids.  Labs Sodium-133 low, glucose-134 elevated  Height: Ht Readings from Last 1 Encounters:  03/02/14 6' (1.829 m)    Weight Status:   Wt Readings from Last 1 Encounters:  03/16/14 119 lb 7.8 oz (54.2 kg)    Re-estimated needs:  Kcal: 1800-2080 ( to promote gradual wt gain) Protein: 75-85 gr Fluid: >1500 ml daily  Skin: Intact  Diet Order: DIET - DYS 1   Intake/Output Summary (Last 24 hours) at 03/16/14 1639 Last data filed at 03/16/14 0500  Gross per 24 hour  Intake  187.5 ml  Output    950 ml  Net -762.5 ml    Last BM: 12/28    Labs:   Recent Labs Lab 03/10/14 0608 03/15/14 1140 03/16/14 0514  NA 129* 131* 133*  K 4.6 4.3 3.5  CL 94* 95* 100  CO2 29 30 26    BUN 5* 12 9  CREATININE 0.64 0.81 0.74  CALCIUM 8.5 8.6 8.0*  GLUCOSE 270* 192* 134*    CBG (last 3)   Recent Labs  03/16/14 1027 03/16/14 1138 03/16/14 1359  GLUCAP 75 95 217*    Scheduled Meds: . antiseptic oral rinse  7 mL Mouth Rinse BID  . heparin  5,000 Units Subcutaneous 3 times per day  . piperacillin-tazobactam (ZOSYN)  IV  3.375 g Intravenous Q8H  . vancomycin  750 mg Intravenous Q12H    Continuous Infusions: . dextrose 50 mL/hr at 03/16/14 0300    Colman Cater MS,RD,CSG,LDN Office: #[phone] Pager: [phone]

## 2014-03-16 NOTE — Consult Note (Signed)
Consult requested by: [facility] Consult requested for respiratory failure:  HPI: This is a 63 year old who lives with family and to was found to be unarousable at home. He had a blood sugar of 46. He has multiple medical problems including squamous cell cancer found in a biopsy of the cervical lymph node multiple pulmonary nodules, COPD, diabetes, liver disease, anemia, malnutrition, esophageal varices, and hydronephrosis. He had previously been in the hospital with aspiration pneumonia after choking. He had PEG tube placed during his last hospitalization but apparently he has not been receiving any tube feedings. He has not been compliant on his diet. He has adenocarcinoma of the colon. He was found to be in respiratory failure and was placed on BiPAP and says he feels better.  Past Medical History  Diagnosis Date  . Diabetes mellitus   . Liver disease   . Enlarged prostate   . Hernia   . Alcohol dependence   . Seizures   . Hypercholesterolemia   . Liver lesion   . Pulmonary nodules   . Cervical mass     biopsy 03/04/14  . Metastatic disease   . Anemia   . Malnutrition   . Esophageal varices   . Hydronephrosis   . Cholelithiasis      Family History  Problem Relation Age of Onset  . Diabetes Mother   . Hypertension Mother   . Cancer Father   . Stomach cancer Father   . Cancer Sister   . Hypertension Other   . Colon cancer Neg Hx      History   Social History  . Marital Status: Single    Spouse Name: N/A    Number of Children: N/A  . Years of Education: N/A   Social History Main Topics  . Smoking status: Current Every Day Smoker -- 1.50 packs/day for 40 years    Types: Cigarettes  . Smokeless tobacco: Never Used  . Alcohol Use: 0.0 oz/week    0 Not specified per week     Comment: Drinks "every once in awhile", will drink 1/2 gallon of liqour   . Drug Use: No  . Sexual Activity: None   Other Topics Concern  . None   Social History Narrative      ROS: Difficult to obtain because he's on BiPAP.    Objective: Vital signs in last 24 hours: Temp:  [95 F (35 C)-98.6 F (37 C)] 98.4 F (36.9 C) (12/28 0800) Pulse Rate:  [86-108] 87 (12/28 0600) Resp:  [17-36] 20 (12/28 0600) BP: (89-141)/(52-88) 122/65 mmHg (12/28 0600) SpO2:  [70 %-100 %] 100 % (12/28 0600) Weight:  [54.2 kg (119 lb 7.8 oz)] 54.2 kg (119 lb 7.8 oz) (12/28 0500) Weight change:  Last BM Date: 03/15/14  Intake/Output from previous day: 12/27 0701 - 12/28 0700 In: 187.5 [I.V.:187.5] Out: 950 [Urine:950]  PHYSICAL EXAM He is awake and alert. He is on nasal BiPAP. His pupils are reactive nose and throat are clear he is edentulous. His neck is supple. His chest shows rhonchi bilaterally. His heart is regular. Abdomen is soft without masses he has a PEG tube in place extremities show no edema central nervous system exam is grossly intact  Lab Results: Basic Metabolic Panel:  Recent Labs  03/15/14 1140 03/16/14 0514  NA 131* 133*  K 4.3 3.5  CL 95* 100  CO2 30 26  GLUCOSE 192* 134*  BUN 12 9  CREATININE 0.81 0.74  CALCIUM 8.6 8.0*   Liver Function Tests:  No results for input(s): AST, ALT, ALKPHOS, BILITOT, PROT, ALBUMIN in the last 72 hours. No results for input(s): LIPASE, AMYLASE in the last 72 hours. No results for input(s): AMMONIA in the last 72 hours. CBC:  Recent Labs  03/15/14 1140  WBC 14.9*  NEUTROABS 13.7*  HGB 12.1*  HCT 37.0*  MCV 71.7*  PLT 461*   Cardiac Enzymes: No results for input(s): CKTOTAL, CKMB, CKMBINDEX, TROPONINI in the last 72 hours. BNP: No results for input(s): PROBNP in the last 72 hours. D-Dimer: No results for input(s): DDIMER in the last 72 hours. CBG:  Recent Labs  03/15/14 2346 03/16/14 0046 03/16/14 0159 03/16/14 0356 03/16/14 0611 03/16/14 0750  GLUCAP 115* 181* 150* 117* 124* 89   Hemoglobin A1C: No results for input(s): HGBA1C in the last 72 hours. Fasting Lipid Panel: No results for  input(s): CHOL, HDL, LDLCALC, TRIG, CHOLHDL, LDLDIRECT in the last 72 hours. Thyroid Function Tests: No results for input(s): TSH, T4TOTAL, FREET4, T3FREE, THYROIDAB in the last 72 hours. Anemia Panel: No results for input(s): VITAMINB12, FOLATE, FERRITIN, TIBC, IRON, RETICCTPCT in the last 72 hours. Coagulation: No results for input(s): LABPROT, INR in the last 72 hours. Urine Drug Screen: Drugs of Abuse     Component Value Date/Time   LABOPIA NONE DETECTED 12/19/2010 1758   COCAINSCRNUR NONE DETECTED 12/19/2010 1758   LABBENZ NONE DETECTED 12/19/2010 1758   AMPHETMU NONE DETECTED 12/19/2010 1758   THCU NONE DETECTED 12/19/2010 1758   LABBARB NONE DETECTED 12/19/2010 1758    Alcohol Level: No results for input(s): ETH in the last 72 hours. Urinalysis:  Recent Labs  03/15/14 1308  COLORURINE AMBER*  LABSPEC 1.020  PHURINE 6.0  GLUCOSEU 250*  HGBUR LARGE*  BILIRUBINUR NEGATIVE  KETONESUR NEGATIVE  PROTEINUR TRACE*  UROBILINOGEN 0.2  NITRITE NEGATIVE  LEUKOCYTESUR TRACE*   Misc. Labs:   ABGS:  Recent Labs  03/15/14 1300  PHART 7.404  PO2ART 44.7*  TCO2 23.8  HCO3 25.7*     MICROBIOLOGY: Recent Results (from the past 240 hour(s))  Clostridium Difficile by PCR     Status: None   Collection Time: 03/06/14  3:20 PM  Result Value Ref Range Status   C difficile by pcr NEGATIVE NEGATIVE Final  Blood Culture (routine x 2)     Status: None (Preliminary result)   Collection Time: 03/15/14 12:42 PM  Result Value Ref Range Status   Specimen Description BLOOD LEFT HAND  Final   Special Requests BOTTLES DRAWN AEROBIC AND ANAEROBIC 8CC BOTTLES  Final   Culture PENDING  Incomplete   Report Status PENDING  Incomplete  Blood Culture (routine x 2)     Status: None (Preliminary result)   Collection Time: 03/15/14 12:42 PM  Result Value Ref Range Status   Specimen Description BLOOD RIGHT HAND DRAWN BY RN  Final   Special Requests BOTTLES DRAWN AEROBIC AND ANAEROBIC 6CC  BOTTLES  Final   Culture PENDING  Incomplete   Report Status PENDING  Incomplete  MRSA PCR Screening     Status: None   Collection Time: 03/15/14  5:19 PM  Result Value Ref Range Status   MRSA by PCR NEGATIVE NEGATIVE Final    Comment:        The GeneXpert MRSA Assay (FDA approved for NASAL specimens only), is one component of a comprehensive MRSA colonization surveillance program. It is not intended to diagnose MRSA infection nor to guide or monitor treatment for MRSA infections.     Studies/Results: Dg Chest  Portable 1 View  03/15/2014   CLINICAL DATA:  Unresponsive this morning  EXAM: PORTABLE CHEST - 1 VIEW  COMPARISON:  03/02/2014  FINDINGS: Right hemidiaphragm remains elevated. Heterogeneous opacities have increased at the right lung base. Vascular congestion. No Kerley B-lines. Right infrahilar nodular density persists.  IMPRESSION: Increasing right lower lobe airspace disease.  Vascular congestion has developed.   Electronically Signed   By: Maryclare Bean M.D.   On: 03/15/2014 12:27    Medications:  Prior to Admission:  Prescriptions prior to admission  Medication Sig Dispense Refill Last Dose  . atorvastatin (LIPITOR) 10 MG tablet Take 10 mg by mouth every evening.    03/01/2014 at Unknown time  . clindamycin (CLEOCIN) 300 MG capsule Take 1 capsule (300 mg total) by mouth 3 (three) times daily. 4 capsule 0   . ferrous sulfate 325 (65 FE) MG tablet Take 2 tablets (650 mg total) by mouth 2 (two) times daily with a meal.     . folic acid (FOLVITE) 1 MG tablet Take 1 tablet (1 mg total) by mouth daily.     Marland Kitchen glipiZIDE (GLUCOTROL) 10 MG tablet Take 10 mg by mouth 2 (two) times daily before a meal.     03/01/2014 at Unknown time  . levalbuterol (XOPENEX) 0.63 MG/3ML nebulizer solution Take 3 mLs (0.63 mg total) by nebulization 3 (three) times daily. 270 mL 1   . levalbuterol (XOPENEX) 0.63 MG/3ML nebulizer solution Take 3 mLs (0.63 mg total) by nebulization every 6 (six) hours as  needed for wheezing or shortness of breath. 90 mL 1   . lisinopril (PRINIVIL,ZESTRIL) 5 MG tablet Take 5 mg by mouth daily.   03/01/2014 at Unknown time  . nicotine polacrilex (NICORETTE) 2 MG gum Take 1 each (2 mg total) by mouth as needed for smoking cessation. 100 tablet 1   . pantoprazole (PROTONIX) 40 MG tablet Take 1 tablet (40 mg total) by mouth 2 (two) times daily before a meal. 60 tablet 1   . polyethylene glycol (MIRALAX / GLYCOLAX) packet Take 17 g by mouth daily.   unknown   Scheduled: . heparin  5,000 Units Subcutaneous 3 times per day  . piperacillin-tazobactam (ZOSYN)  IV  3.375 g Intravenous Q8H  . vancomycin  750 mg Intravenous Q12H   Continuous: . dextrose 50 mL/hr at 03/16/14 0300   ERX:[id number]  Assesment: He has acute respiratory failure multifactorial. He has healthcare associated pneumonia. He has aspirated in the past and probably has again. He has adenocarcinoma the colon and a squamous cell cancer in his neck. He has a history of cirrhosis of the liver. He may have metastatic disease to his lung. Principal Problem:   Acute respiratory failure Active Problems:   Aspiration pneumonia   Hypokalemia   Tobacco abuse   Cirrhosis   Neck mass   Metastatic disease   Absolute anemia   Cholelithiasis   Esophageal varices   Hydronephrosis   HCAP (healthcare-associated pneumonia)    Plan: Continue BiPAP for the moment. He may be able to try off BiPAP and place him on nasal or mask oxygen later today. I agree with current treatments.    LOS: 1 day   [name] L 03/16/2014, 8:25 AM

## 2014-03-16 NOTE — Progress Notes (Signed)
Patient appears to be doing well on BiPAP

## 2014-03-16 NOTE — Care Management Utilization Note (Signed)
UR complete 

## 2014-03-17 DIAGNOSIS — J189 Pneumonia, unspecified organism: Secondary | ICD-10-CM

## 2014-03-17 LAB — GLUCOSE, CAPILLARY
GLUCOSE-CAPILLARY: 155 mg/dL — AB (ref 70–99)
GLUCOSE-CAPILLARY: 122 mg/dL — AB (ref 70–99)
Glucose-Capillary: 149 mg/dL — ABNORMAL HIGH (ref 70–99)
Glucose-Capillary: 167 mg/dL — ABNORMAL HIGH (ref 70–99)
Glucose-Capillary: 135 mg/dL — ABNORMAL HIGH (ref 70–99)

## 2014-03-17 LAB — PROCALCITONIN: PROCALCITONIN: 4.5 ng/mL

## 2014-03-17 MED ORDER — JEVITY 1.2 CAL PO LIQD
474.0000 mL | Freq: Three times a day (TID) | ORAL | Status: DC
  Administered 2014-03-17 – 2014-03-20 (×9): 474 mL
  Filled 2014-03-17 (×19): qty 1000

## 2014-03-17 MED ORDER — CLINDAMYCIN PALMITATE HCL 75 MG/5ML PO SOLR
300.0000 mg | Freq: Three times a day (TID) | ORAL | Status: DC
  Administered 2014-03-17 – 2014-03-20 (×10): 300 mg via ORAL
  Filled 2014-03-17 (×19): qty 20

## 2014-03-17 MED ORDER — JEVITY 1.2 CAL PO LIQD
474.0000 mL | Freq: Three times a day (TID) | ORAL | Status: DC
  Filled 2014-03-17 (×3): qty 1000

## 2014-03-17 NOTE — Progress Notes (Signed)
NUTRITION FOLLOW UP- INTERVENTION:  Bolus feedings initiated: Jevity 1.2 (474 ml ) TID add 120 ml flushes TID Provides: 1710 kcal, 79 gr protein and 1146 ml of water and 1506 ml daily with flushes added.  Nutrition Dx:   Inadequate oral intake related to dysphagia as evidenced by swallow evaluation and severe wasting clavicles, patella, quadricept regions, 15% wt loss x 7 months; ongoing  Goal:  Pt to meet >/= 90% of their estimated nutrition needs; progressing  Monitor:   Nutrition support tolerance, Protein-energy intake, labs and wt trends   Assessment:   Pt re-admitted 12/27 Poor prognosis per MD has talked with family to discuss possible hospice. He is NPO due to aspiration risk and tube feeding is being started.  Height: Ht Readings from Last 1 Encounters:  03/16/14 6' (1.829 m)    Weight Status:   Wt Readings from Last 1 Encounters:  03/16/14 119 lb 7.8 oz (54.2 kg)    Re-estimated needs:  Kcal: 1800-2080 ( to promote gradual wt gain) Protein: 75-85 gr Fluid: >1500 ml daily  Skin: Intact  Diet Order: Diet NPO time specified   Intake/Output Summary (Last 24 hours) at 03/17/14 1635 Last data filed at 03/17/14 0820  Gross per 24 hour  Intake 800.01 ml  Output   2400 ml  Net -1599.99 ml    Last BM: 12/28    Labs:   Recent Labs Lab 03/15/14 1140 03/16/14 0514  NA 131* 133*  K 4.3 3.5  CL 95* 100  CO2 30 26  BUN 12 9  CREATININE 0.81 0.74  CALCIUM 8.6 8.0*  GLUCOSE 192* 134*    CBG (last 3)   Recent Labs  03/17/14 0154 03/17/14 0736 03/17/14 1141  GLUCAP 149* 135* 155*    Scheduled Meds: . antiseptic oral rinse  7 mL Mouth Rinse BID  . clindamycin  300 mg Oral 3 times per day  . feeding supplement (JEVITY 1.2 CAL)  474 mL Per Tube TID  . heparin  5,000 Units Subcutaneous 3 times per day  . nicotine  14 mg Transdermal Daily    Continuous Infusions:    Colman Cater MS,RD,CSG,LDN Office: [phone] Pager: [phone]
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
## 2014-03-17 NOTE — Progress Notes (Signed)
Subjective: He is much more alert and looks less acutely ill. He has significant trouble with eating and gets choked. During his last hospitalization speech evaluation showed that he was at risk with any diet and he did have a PEG tube placed. He has not been receiving tube feedings because of lack of supplies.  Objective: Vital signs in last 24 hours: Temp:  [97.4 F (36.3 C)-98.4 F (36.9 C)] 98.1 F (36.7 C) (12/29 0000) Pulse Rate:  [76-99] 91 (12/29 0300) Resp:  [12-27] 21 (12/29 0400) BP: (90-160)/(50-121) 159/89 mmHg (12/29 0400) SpO2:  [95 %-100 %] 98 % (12/29 0300) Weight change:  Last BM Date: 03/16/14  Intake/Output from previous day: 12/28 0701 - 12/29 0700 In: 1000 [I.V.:550; IV Piggyback:450] Out: 800 [Urine:800]  PHYSICAL EXAM General appearance: alert, mild distress and His speech is still very difficult to understand Resp: He sounds very congested but this is mostly in the tracheal area Cardio: regular rate and rhythm, S1, S2 normal, no murmur, click, rub or gallop GI: Soft PEG tube in place Extremities: extremities normal, atraumatic, no cyanosis or edema  Lab Results:  Results for orders placed or performed during the hospital encounter of 03/15/14 (from the past 48 hour(s))  CBG monitoring, ED     Status: Abnormal   Collection Time: 03/15/14 11:27 AM  Result Value Ref Range   Glucose-Capillary 223 (H) 70 - 99 mg/dL  Basic metabolic panel     Status: Abnormal   Collection Time: 03/15/14 11:40 AM  Result Value Ref Range   Sodium 131 (L) 135 - 145 mmol/L    Comment: Please note change in reference range.   Potassium 4.3 3.5 - 5.1 mmol/L    Comment: Please note change in reference range.   Chloride 95 (L) 96 - 112 mEq/L   CO2 30 19 - 32 mmol/L   Glucose, Bld 192 (H) 70 - 99 mg/dL   BUN 12 6 - 23 mg/dL   Creatinine, Ser 0.81 0.50 - 1.35 mg/dL   Calcium 8.6 8.4 - 10.5 mg/dL   GFR calc non Af Amer >90 >90 mL/min   GFR calc Af Amer >90 >90 mL/min   Comment: (NOTE) The eGFR has been calculated using the CKD EPI equation. This calculation has not been validated in all clinical situations. eGFR's persistently <90 mL/min signify possible Chronic Kidney Disease.    Anion gap 6 5 - 15  CBC with Differential     Status: Abnormal   Collection Time: 03/15/14 11:40 AM  Result Value Ref Range   WBC 14.9 (H) 4.0 - 10.5 K/uL   RBC 5.16 4.22 - 5.81 MIL/uL   Hemoglobin 12.1 (L) 13.0 - 17.0 g/dL   HCT 37.0 (L) 39.0 - 52.0 %   MCV 71.7 (L) 78.0 - 100.0 fL   MCH 23.4 (L) 26.0 - 34.0 pg   MCHC 32.7 30.0 - 36.0 g/dL   RDW 17.9 (H) 11.5 - 15.5 %   Platelets 461 (H) 150 - 400 K/uL   Neutrophils Relative % 92 (H) 43 - 77 %   Neutro Abs 13.7 (H) 1.7 - 7.7 K/uL   Lymphocytes Relative 5 (L) 12 - 46 %   Lymphs Abs 0.8 0.7 - 4.0 K/uL   Monocytes Relative 3 3 - 12 %   Monocytes Absolute 0.4 0.1 - 1.0 K/uL   Eosinophils Relative 0 0 - 5 %   Eosinophils Absolute 0.0 0.0 - 0.7 K/uL   Basophils Relative 0 0 - 1 %  Basophils Absolute 0.0 0.0 - 0.1 K/uL  Blood Culture (routine x 2)     Status: None (Preliminary result)   Collection Time: 03/15/14 12:42 PM  Result Value Ref Range   Specimen Description BLOOD LEFT HAND    Special Requests BOTTLES DRAWN AEROBIC AND ANAEROBIC 8CC BOTTLES    Culture NO GROWTH 1 DAY    Report Status PENDING   Blood Culture (routine x 2)     Status: None (Preliminary result)   Collection Time: 03/15/14 12:42 PM  Result Value Ref Range   Specimen Description BLOOD RIGHT HAND DRAWN BY RN    Special Requests BOTTLES DRAWN AEROBIC AND ANAEROBIC 6CC BOTTLES    Culture NO GROWTH 1 DAY    Report Status PENDING   CBG monitoring, ED     Status: Abnormal   Collection Time: 03/15/14 12:52 PM  Result Value Ref Range   Glucose-Capillary 228 (H) 70 - 99 mg/dL  I-Stat CG4 Lactic Acid, ED     Status: None   Collection Time: 03/15/14 12:59 PM  Result Value Ref Range   Lactic Acid, Venous 2.11 0.5 - 2.2 mmol/L  Blood gas, arterial ([facility]  & [facility] ONLY)     Status: Abnormal   Collection Time: 03/15/14  1:00 PM  Result Value Ref Range   FIO2 100.00 %   Delivery systems OXYGEN MASK    pH, Arterial 7.404 7.350 - 7.450   pCO2 arterial 42.0 35.0 - 45.0 mmHg   pO2, Arterial 44.7 (L) 80.0 - 100.0 mmHg   Bicarbonate 25.7 (H) 20.0 - 24.0 mEq/L   TCO2 23.8 0 - 100 mmol/L   Acid-Base Excess 1.5 0.0 - 2.0 mmol/L   O2 Saturation 77.1 %   Patient temperature 37.0    Collection site RIGHT RADIAL    Drawn by COLLECTED BY RT    Sample type ARTERIAL    Allens test (pass/fail) PASS PASS  Urinalysis, Routine w reflex microscopic     Status: Abnormal   Collection Time: 03/15/14  1:08 PM  Result Value Ref Range   Color, Urine AMBER (A) YELLOW    Comment: BIOCHEMICALS MAY BE AFFECTED BY COLOR   APPearance CLOUDY (A) CLEAR   Specific Gravity, Urine 1.020 1.005 - 1.030   pH 6.0 5.0 - 8.0   Glucose, UA 250 (A) NEGATIVE mg/dL   Hgb urine dipstick LARGE (A) NEGATIVE   Bilirubin Urine NEGATIVE NEGATIVE   Ketones, ur NEGATIVE NEGATIVE mg/dL   Protein, ur TRACE (A) NEGATIVE mg/dL   Urobilinogen, UA 0.2 0.0 - 1.0 mg/dL   Nitrite NEGATIVE NEGATIVE   Leukocytes, UA TRACE (A) NEGATIVE  Urine culture     Status: None (Preliminary result)   Collection Time: 03/15/14  1:08 PM  Result Value Ref Range   Specimen Description URINE, CATHETERIZED    Special Requests NONE    Colony Count      25,000 COLONIES/ML Performed at [place] Performed at [facility]    Report Status PENDING   Urine microscopic-add on     Status: Abnormal   Collection Time: 03/15/14  1:08 PM  Result Value Ref Range   WBC, UA 3-6 <3 WBC/hpf   RBC / HPF TOO NUMEROUS TO COUNT <3 RBC/hpf   Crystals CA OXALATE CRYSTALS (A) NEGATIVE  Procalcitonin - Baseline     Status: None   Collection Time: 03/15/14  2:03 PM  Result Value Ref Range  Procalcitonin 0.17 ng/mL    Comment:        Interpretation: PCT (Procalcitonin)  <= 0.5 ng/mL: Systemic infection (sepsis) is not likely. Local bacterial infection is possible. (NOTE)         ICU PCT Algorithm               Non ICU PCT Algorithm    ----------------------------     ------------------------------         PCT < 0.25 ng/mL                 PCT < 0.1 ng/mL     Stopping of antibiotics            Stopping of antibiotics       strongly encouraged.               strongly encouraged.    ----------------------------     ------------------------------       PCT level decrease by               PCT < 0.25 ng/mL       >= 80% from peak PCT       OR PCT 0.25 - 0.5 ng/mL          Stopping of antibiotics                                             encouraged.     Stopping of antibiotics           encouraged.    ----------------------------     ------------------------------       PCT level decrease by              PCT >= 0.25 ng/mL       < 80% from peak PCT        AND PCT >= 0.5 ng/mL            Continuin g antibiotics                                              encouraged.       Continuing antibiotics            encouraged.    ----------------------------     ------------------------------     PCT level increase compared          PCT > 0.5 ng/mL         with peak PCT AND          PCT >= 0.5 ng/mL             Escalation of antibiotics                                          strongly encouraged.      Escalation of antibiotics        strongly encouraged.   CBG monitoring, ED     Status: Abnormal   Collection Time: 03/15/14  3:31 PM  Result Value Ref Range   Glucose-Capillary 174 (H) 70 - 99 mg/dL  Glucose, capillary     Status: None   Collection Time: 03/15/14  5:14 PM  Result Value Ref Range   Glucose-Capillary 92 70 - 99 mg/dL   Comment 1 Notify RN   MRSA PCR Screening     Status: None   Collection Time: 03/15/14  5:19 PM  Result Value Ref Range   MRSA by PCR NEGATIVE NEGATIVE    Comment:        The GeneXpert MRSA Assay (FDA approved for NASAL  specimens only), is one component of a comprehensive MRSA colonization surveillance program. It is not intended to diagnose MRSA infection nor to guide or monitor treatment for MRSA infections.   Glucose, capillary     Status: Abnormal   Collection Time: 03/15/14  6:35 PM  Result Value Ref Range   Glucose-Capillary 25 (LL) 70 - 99 mg/dL    Comment: REPEATED TO VERIFY, CHARGE CREDITED   Comment 1 Notify RN   Glucose, capillary     Status: Abnormal   Collection Time: 03/15/14  6:37 PM  Result Value Ref Range   Glucose-Capillary 36 (LL) 70 - 99 mg/dL   Comment 1 Notify RN   Glucose, capillary     Status: Abnormal   Collection Time: 03/15/14  6:55 PM  Result Value Ref Range   Glucose-Capillary 144 (H) 70 - 99 mg/dL   Comment 1 Notify RN   Glucose, capillary     Status: Abnormal   Collection Time: 03/15/14  7:46 PM  Result Value Ref Range   Glucose-Capillary 127 (H) 70 - 99 mg/dL  Glucose, capillary     Status: None   Collection Time: 03/15/14  8:47 PM  Result Value Ref Range   Glucose-Capillary 98 70 - 99 mg/dL  Glucose, capillary     Status: Abnormal   Collection Time: 03/15/14 10:04 PM  Result Value Ref Range   Glucose-Capillary 64 (L) 70 - 99 mg/dL  Glucose, capillary     Status: Abnormal   Collection Time: 03/15/14 11:01 PM  Result Value Ref Range   Glucose-Capillary 44 (LL) 70 - 99 mg/dL   Comment 1 Documented in Chart    Comment 2 Notify RN   Glucose, capillary     Status: Abnormal   Collection Time: 03/15/14 11:46 PM  Result Value Ref Range   Glucose-Capillary 115 (H) 70 - 99 mg/dL   Comment 1 Notify RN   Glucose, capillary     Status: Abnormal   Collection Time: 03/16/14 12:46 AM  Result Value Ref Range   Glucose-Capillary 181 (H) 70 - 99 mg/dL   Comment 1 Notify RN   Glucose, capillary     Status: Abnormal   Collection Time: 03/16/14  1:59 AM  Result Value Ref Range   Glucose-Capillary 150 (H) 70 - 99 mg/dL  Glucose, capillary     Status: Abnormal    Collection Time: 03/16/14  3:56 AM  Result Value Ref Range   Glucose-Capillary 117 (H) 70 - 99 mg/dL   Comment 1 Notify RN   Procalcitonin     Status: None   Collection Time: 03/16/14  5:14 AM  Result Value Ref Range   Procalcitonin 5.70 ng/mL    Comment:        Interpretation: PCT > 2 ng/mL: Systemic infection (sepsis) is likely, unless other causes are known. (NOTE)         ICU PCT Algorithm               Non ICU PCT Algorithm    ----------------------------     ------------------------------  PCT < 0.25 ng/mL                 PCT < 0.1 ng/mL     Stopping of antibiotics            Stopping of antibiotics       strongly encouraged.               strongly encouraged.    ----------------------------     ------------------------------       PCT level decrease by               PCT < 0.25 ng/mL       >= 80% from peak PCT       OR PCT 0.25 - 0.5 ng/mL          Stopping of antibiotics                                             encouraged.     Stopping of antibiotics           encouraged.    ----------------------------     ------------------------------       PCT level decrease by              PCT >= 0.25 ng/mL       < 80% from peak PCT        AND PCT >= 0.5 ng/mL            Continuing antibiotics                                               encouraged.       Continuing antibiotics            encouraged.    ----------------------------     ------------------------------     PCT level increase compared          PCT > 0.5 ng/mL         with peak PCT AND          PCT >= 0.5 ng/mL             Escalation of antibiotics                                          strongly encouraged.      Escalation of antibiotics        strongly encouraged.   Basic metabolic panel     Status: Abnormal   Collection Time: 03/16/14  5:14 AM  Result Value Ref Range   Sodium 133 (L) 135 - 145 mmol/L    Comment: Please note change in reference range.   Potassium 3.5 3.5 - 5.1 mmol/L    Comment:  DELTA CHECK NOTED Please note change in reference range.    Chloride 100 96 - 112 mEq/L   CO2 26 19 - 32 mmol/L   Glucose, Bld 134 (H) 70 - 99 mg/dL   BUN 9 6 - 23 mg/dL   Creatinine, Ser 0.74 0.50 - 1.35 mg/dL   Calcium 8.0 (L) 8.4 - 10.5 mg/dL   GFR calc non Af Amer >90 >90  mL/min   GFR calc Af Amer >90 >90 mL/min    Comment: (NOTE) The eGFR has been calculated using the CKD EPI equation. This calculation has not been validated in all clinical situations. eGFR's persistently <90 mL/min signify possible Chronic Kidney Disease.    Anion gap 7 5 - 15  Glucose, capillary     Status: Abnormal   Collection Time: 03/16/14  6:11 AM  Result Value Ref Range   Glucose-Capillary 124 (H) 70 - 99 mg/dL   Comment 1 Notify RN   Glucose, capillary     Status: None   Collection Time: 03/16/14  7:50 AM  Result Value Ref Range   Glucose-Capillary 89 70 - 99 mg/dL   Comment 1 Documented in Chart    Comment 2 Notify RN   Glucose, capillary     Status: None   Collection Time: 03/16/14  9:00 AM  Result Value Ref Range   Glucose-Capillary 82 70 - 99 mg/dL   Comment 1 Documented in Chart    Comment 2 Notify RN   Glucose, capillary     Status: None   Collection Time: 03/16/14 10:27 AM  Result Value Ref Range   Glucose-Capillary 75 70 - 99 mg/dL   Comment 1 Documented in Chart    Comment 2 Notify RN   Glucose, capillary     Status: None   Collection Time: 03/16/14 11:38 AM  Result Value Ref Range   Glucose-Capillary 95 70 - 99 mg/dL  Glucose, capillary     Status: Abnormal   Collection Time: 03/16/14  1:59 PM  Result Value Ref Range   Glucose-Capillary 217 (H) 70 - 99 mg/dL   Comment 1 Documented in Chart    Comment 2 Notify RN   Glucose, capillary     Status: Abnormal   Collection Time: 03/16/14  4:23 PM  Result Value Ref Range   Glucose-Capillary 271 (H) 70 - 99 mg/dL   Comment 1 Documented in Chart    Comment 2 Notify RN   Glucose, capillary     Status: Abnormal   Collection Time:  03/16/14  6:06 PM  Result Value Ref Range   Glucose-Capillary 212 (H) 70 - 99 mg/dL  Glucose, capillary     Status: Abnormal   Collection Time: 03/16/14  7:38 PM  Result Value Ref Range   Glucose-Capillary 209 (H) 70 - 99 mg/dL  Glucose, capillary     Status: Abnormal   Collection Time: 03/16/14 11:51 PM  Result Value Ref Range   Glucose-Capillary 167 (H) 70 - 99 mg/dL  Glucose, capillary     Status: Abnormal   Collection Time: 03/17/14  1:54 AM  Result Value Ref Range   Glucose-Capillary 149 (H) 70 - 99 mg/dL    ABGS  Recent Labs  03/15/14 1300  PHART 7.404  PO2ART 44.7*  TCO2 23.8  HCO3 25.7*   CULTURES Recent Results (from the past 240 hour(s))  Blood Culture (routine x 2)     Status: None (Preliminary result)   Collection Time: 03/15/14 12:42 PM  Result Value Ref Range Status   Specimen Description BLOOD LEFT HAND  Final   Special Requests BOTTLES DRAWN AEROBIC AND ANAEROBIC 8CC BOTTLES  Final   Culture NO GROWTH 1 DAY  Final   Report Status PENDING  Incomplete  Blood Culture (routine x 2)     Status: None (Preliminary result)   Collection Time: 03/15/14 12:42 PM  Result Value Ref Range Status   Specimen Description BLOOD RIGHT  HAND DRAWN BY RN  Final   Special Requests BOTTLES DRAWN AEROBIC AND ANAEROBIC 6CC BOTTLES  Final   Culture NO GROWTH 1 DAY  Final   Report Status PENDING  Incomplete  Urine culture     Status: None (Preliminary result)   Collection Time: 03/15/14  1:08 PM  Result Value Ref Range Status   Specimen Description URINE, CATHETERIZED  Final   Special Requests NONE  Final   Colony Count   Final    25,000 COLONIES/ML Performed at [facility]    Culture   Final    ENTEROCOCCUS SPECIES Performed at [facility]    Report Status PENDING  Incomplete  MRSA PCR Screening     Status: None   Collection Time: 03/15/14  5:19 PM  Result Value Ref Range Status   MRSA by PCR NEGATIVE NEGATIVE Final    Comment:        The GeneXpert  MRSA Assay (FDA approved for NASAL specimens only), is one component of a comprehensive MRSA colonization surveillance program. It is not intended to diagnose MRSA infection nor to guide or monitor treatment for MRSA infections.    Studies/Results: Dg Chest Port 1 View  03/16/2014   CLINICAL DATA:  Aspiration into the airway  EXAM: PORTABLE CHEST - 1 VIEW  COMPARISON:  03/15/2014, 05/23/2010  FINDINGS: There is elevation of the right diaphragm. There is right lower lobe pulmonary nodule. There is no focal parenchymal opacity, pleural effusion, or pneumothorax. The heart and mediastinal contours are unremarkable.  The osseous structures are unremarkable.  IMPRESSION: No active disease.  Right lower lobe pulmonary nodule better seen on recent CT chest 03/03/2014.   Electronically Signed   By: Kathreen Devoid   On: 03/16/2014 11:28   Dg Chest Portable 1 View  03/15/2014   CLINICAL DATA:  Unresponsive this morning  EXAM: PORTABLE CHEST - 1 VIEW  COMPARISON:  03/02/2014  FINDINGS: Right hemidiaphragm remains elevated. Heterogeneous opacities have increased at the right lung base. Vascular congestion. No Kerley B-lines. Right infrahilar nodular density persists.  IMPRESSION: Increasing right lower lobe airspace disease.  Vascular congestion has developed.   Electronically Signed   By: Maryclare Bean M.D.   On: 03/15/2014 12:27    Medications:  Prior to Admission:  Prescriptions prior to admission  Medication Sig Dispense Refill Last Dose  . atorvastatin (LIPITOR) 10 MG tablet Take 10 mg by mouth every evening.    unknown  . glipiZIDE (GLUCOTROL) 10 MG tablet Take 10 mg by mouth 2 (two) times daily before a meal.     unknown  . insulin aspart (NOVOLOG) 100 UNIT/ML injection Inject 16 Units into the skin 3 (three) times daily before meals.   unknown  . insulin detemir (LEVEMIR) 100 UNIT/ML injection Inject 5 Units into the skin at bedtime.   unknown  . levalbuterol (XOPENEX) 0.63 MG/3ML nebulizer  solution Take 3 mLs (0.63 mg total) by nebulization 3 (three) times daily. 270 mL 1 unknown  . lisinopril (PRINIVIL,ZESTRIL) 5 MG tablet Take 5 mg by mouth daily.   unknown  . pantoprazole (PROTONIX) 40 MG tablet Take 1 tablet (40 mg total) by mouth 2 (two) times daily before a meal. 60 tablet 1 unknown  . clindamycin (CLEOCIN) 300 MG capsule Take 1 capsule (300 mg total) by mouth 3 (three) times daily. (Patient not taking: Reported on 03/16/2014) 4 capsule 0   . ferrous sulfate 325 (65 FE) MG tablet Take 2 tablets (650 mg total) by  mouth 2 (two) times daily with a meal.     . folic acid (FOLVITE) 1 MG tablet Take 1 tablet (1 mg total) by mouth daily.     Marland Kitchen levalbuterol (XOPENEX) 0.63 MG/3ML nebulizer solution Take 3 mLs (0.63 mg total) by nebulization every 6 (six) hours as needed for wheezing or shortness of breath. (Patient not taking: Reported on 03/16/2014) 90 mL 1   . nicotine polacrilex (NICORETTE) 2 MG gum Take 1 each (2 mg total) by mouth as needed for smoking cessation. (Patient not taking: Reported on 03/16/2014) 100 tablet 1   . polyethylene glycol (MIRALAX / GLYCOLAX) packet Take 17 g by mouth daily.   unknown   Scheduled: . antiseptic oral rinse  7 mL Mouth Rinse BID  . heparin  5,000 Units Subcutaneous 3 times per day  . nicotine  14 mg Transdermal Daily  . piperacillin-tazobactam (ZOSYN)  IV  3.375 g Intravenous Q8H  . vancomycin  750 mg Intravenous Q12H   Continuous:  [id number]  Assesment: He had acute respiratory failure. This has required BiPAP but he is doing better. He has chronic aspiration and is at risk of aspiration apparently with any diet. He can be fed through tube feedings but will still be at risk for aspiration even with that. He is known to have colon cancer. 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 Agree his prognosis is very poor Principal Problem:   Acute respiratory  failure Active Problems:   Aspiration pneumonia   Hypokalemia   Tobacco abuse   Cirrhosis   Neck mass   Metastatic disease   Absolute anemia   Cholelithiasis   Esophageal varices   Hydronephrosis   HCAP (healthcare-associated pneumonia)    Plan: He would be appropriate for hospice if his family agrees. He is at high risk for aspiration even if he does not eat and is fed with tube feedings    LOS: 2 days   Darthy Manganelli L 03/17/2014, 7:32 AM

## 2014-03-17 NOTE — Evaluation (Signed)
Clinical/Bedside Swallow Evaluation Patient Details  Name: [name] MRN: [id number] DOB: March 01, 1951  Today's Date: 03/17/2014 Time: 8:52 PM  - 9:17 PM    Past Medical History:  Past Medical History  Diagnosis Date  . Diabetes mellitus   . Liver disease   . Enlarged prostate   . Hernia   . Alcohol dependence   . Seizures   . Hypercholesterolemia   . Liver lesion   . Pulmonary nodules   . Cervical mass     biopsy 03/04/14  . Metastatic disease   . Anemia   . Malnutrition   . Esophageal varices   . Hydronephrosis   . Cholelithiasis    Past Surgical History:  Past Surgical History  Procedure Laterality Date  . Appendectomy    . Esophagogastroduodenoscopy N/A 03/05/2014    Procedure: ESOPHAGOGASTRODUODENOSCOPY (EGD);  Surgeon: Daneil Dolin, MD;  Location: [facility];  Service: Endoscopy;  Laterality: N/A;  WITH DILATION  . Maloney dilation N/A 03/05/2014    Procedure: Venia Minks DILATION;  Surgeon: Daneil Dolin, MD;  Location: [facility];  Service: Endoscopy;  Laterality: N/A;  . Savory dilation N/A 03/05/2014    Procedure: SAVORY DILATION;  Surgeon: Daneil Dolin, MD;  Location: [facility];  Service: Endoscopy;  Laterality: N/A;  . Colonoscopy N/A 03/09/2014    Procedure: COLONOSCOPY;  Surgeon: Danie Binder, MD;  Location: [facility];  Service: Endoscopy;  Laterality: N/A;  . Esophagogastroduodenoscopy N/A 03/09/2014    Procedure: ESOPHAGOGASTRODUODENOSCOPY (EGD);  Surgeon: Danie Binder, MD;  Location: [facility];  Service: Endoscopy;  Laterality: N/A;  . Peg placement N/A 03/09/2014    Procedure: PERCUTANEOUS ENDOSCOPIC GASTROSTOMY (PEG) PLACEMENT;  Surgeon: Danie Binder, MD;  Location: [facility];  Service: Endoscopy;  Laterality: N/A;   HPI:  [name] is a 63 y.o. male brought in by ambulance to the Emergency Department for hypoglycemia. Per EMS, patient was found unresponsive, with his blood sugar measured at 38. CXR showed ?  Increasing RLL airspace disease with resolving vasc congestion. 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 He has poor overall prognosis. Aspiration pneumonia-his pneumonia on chest x-ray looks worse however could be pneumonia versus metastatic colon cancer to the lung. He is not a candidate for chemotherapy. [name] is known to this SLP from recent hospitalization and MBSS completed 03/06/2014 confirming moderate to severe pharyngeal phase dysphagia. Pt was found to be at risk for aspiration with all consistencies and textures, however a "safest diet" for comfort was initiated of puree and thin liquids. PEG was placed after MBSS, however tube feeds were never initiated at home.    MBSS from 03/06/2014: decreased airway protection resulting in  significant residuals in pharynx post swallow in the valleculae,  pyriforms, and posterior pharyngeal wall. Residuals are more  pronounced with puree and semi solid textures, however liquids  are pooled in pyriforms which pt transiently aspirates (trace  amounts) post swallow. Penetration occurs during the swallow  (from residuals most likely) with liquids, however with cued  cough, pt is able to clear. Alternating semi solids and liquids  was only marginally effective in reducing pharyngeal residue. Pt  did not overtly cough during trace aspiration down posterior  tracheal wall, however he had wet vocal qualiity and some throat  clearing  Assessment/Recommendations/Treatment Plan   SLP Assessment Clinical Impression Statement: [name] presents with functionally severe pharyngeal phase dysphagia with inability to manage secretions. Pt suctioned and clear saliva returned, however audible secretions thought to be in pharynx. Pt without gag  response when suction moved to posterior pharyngeal wall. SLP assessed with single ice chips and ice cream after oral care. Pt with strong signs of penetration/aspiration characterized by immediate weak throat clearing and wet vocal quality; weak coughing. SLP explained reasons for difficulty swallowing to patient (weakness and cancer) and risks for aspiration, however pt does not seem to grasp the gravity. Pt states that he wants to continue to "eat a little bit" by mouth and that he is not bothered by the amount of pharyngeal secretions. Prognosis for safe po diet is poor, however medical prognosis overall may also be poor and may want to consider palliative feeds for patient depending on the goals of care. At this time, no safe po diet can be recommended, however pt was non-compliant on recommended diet after discharge from recent hospitalization (puree/thin) so it is assumed that pt will likely continue to eat by mouth. Recommend NPO with PEG for alternative mean of nutrition. SLP will follow for goals of care; possibility of hospice involvement. Risk for Aspiration: Severe Other Related Risk Factors: History of pneumonia, History of dysphagia, Decreased management of secretions, Decreased respiratory status, Cognitive impairment  Swallow Evaluation Recommendations Diet Recommendations: NPO, Alternative means - long-term  Treatment Plan Treatment Plan Recommendations:  (Pt will follow pending goals of care)  Prognosis Prognosis for Safe Diet Advancement: Guarded Barriers to Reach Goals: Severity of dysphagia  Individuals Consulted Consulted and Agree with Results and Recommendations: Patient, RN  Swallowing Goals   SLP will follow pending goals of care/hospice  Swallow Study Prior Functional Status     General  Date of Onset: 03/02/14 HPI: [name] is a 63 y.o. male brought in by ambulance to the Emergency Department for hypoglycemia. Per EMS, patient was found unresponsive, with  his blood sugar measured at 38. CXR showed ? Increasing RLL airspace disease with resolving vasc congestion. 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 He has poor overall prognosis. Aspiration pneumonia-his pneumonia on chest x-ray looks worse however could be pneumonia versus metastatic colon cancer to the lung. He is not a candidate for chemotherapy. [name] is known to this SLP from recent hospitalization and MBSS completed 03/06/2014 confirming moderate to severe pharyngeal phase dysphagia. Pt was found to be at risk for aspiration with all consistencies and textures, however a "safest diet" for comfort was initiated of puree and thin liquids. PEG was placed after MBSS, however tube feeds were never initiated at home.  Type of Study: Bedside swallow evaluation Previous Swallow Assessment: MBSS 03/06/2014- severe pharyngeal phase with significant residuals in pharynx post swallow; aspiration noted; comfort diet of puree/thin started Diet Prior to this Study: NPO, PEG tube Temperature Spikes Noted: No Respiratory Status: Nasal cannula Behavior/Cognition: Alert, Cooperative Oral Cavity - Dentition: Edentulous Self-Feeding Abilities: Needs assist Patient Positioning: Upright in bed Baseline Vocal Quality: Wet Volitional Cough: Weak, Wet, Congested Volitional Swallow: Able to elicit  Oral Motor/Sensory Function  Overall Oral Motor/Sensory Function: Impaired Labial ROM: Reduced left;Reduced right Labial Symmetry: Within Functional Limits Labial Strength: Reduced Labial Sensation: Within Functional Limits Lingual ROM:  Reduced left Lingual Symmetry: Abnormal symmetry right Lingual Strength: Reduced Lingual Sensation: Within Functional Limits Facial ROM: Reduced left Facial  Symmetry: Left droop Facial Sensation: Within Functional Limits Velum: Within Functional Limits Mandible: Within Functional Limits  Consistency Results  Ice Chips Ice chips: Impaired Presentation: Spoon Pharyngeal Phase Impairments: Suspected delayed Swallow;Decreased hyoid-laryngeal movement;Wet Vocal Quality;Throat Clearing - Immediate  Thin Liquid Thin Liquid: Impaired Presentation: Cup;Straw Pharyngeal  Phase Impairments: Suspected delayed Swallow;Decreased hyoid-laryngeal movement;Wet Vocal Quality;Throat Clearing - Immediate Other Comments: cough is very weak and more like a throat clear only  Nectar Thick Liquid Nectar Thick Liquid: Impaired Presentation: Spoon Pharyngeal Phase Impairments: Suspected delayed Swallow;Decreased hyoid-laryngeal movement;Multiple swallows;Wet Vocal Quality;Throat Clearing - Immediate;Cough - Immediate  Honey Thick Liquid Honey Thick Liquid: Not tested  Puree Puree: Not tested  Solid Solid: Not tested  Thank you,  Genene Churn, [place]  PORTER,DABNEY 03/17/2014,9:28 PM

## 2014-03-18 DIAGNOSIS — I8500 Esophageal varices without bleeding: Secondary | ICD-10-CM

## 2014-03-18 LAB — CBC WITH DIFFERENTIAL/PLATELET
BASOS ABS: 0 10*3/uL (ref 0.0–0.1)
Basophils Absolute: 0 10*3/uL (ref 0.0–0.1)
Basophils Relative: 0 % (ref 0–1)
Basophils Relative: 0 % (ref 0–1)
EOS ABS: 0.2 10*3/uL (ref 0.0–0.7)
EOS ABS: 0 10*3/uL (ref 0.0–0.7)
Eosinophils Relative: 1 % (ref 0–5)
Eosinophils Relative: 0 % (ref 0–5)
HCT: 39.8 % (ref 39.0–52.0)
HEMATOCRIT: 33.8 % — AB (ref 39.0–52.0)
Hemoglobin: 12.8 g/dL — ABNORMAL LOW (ref 13.0–17.0)
Hemoglobin: 10.9 g/dL — ABNORMAL LOW (ref 13.0–17.0)
LYMPHS ABS: 1.4 10*3/uL (ref 0.7–4.0)
LYMPHS PCT: 8 % — AB (ref 12–46)
Lymphocytes Relative: 5 % — ABNORMAL LOW (ref 12–46)
Lymphs Abs: 1.5 10*3/uL (ref 0.7–4.0)
MCH: 23.1 pg — AB (ref 26.0–34.0)
MCH: 23.1 pg — ABNORMAL LOW (ref 26.0–34.0)
MCHC: 32.2 g/dL (ref 30.0–36.0)
MCHC: 32.2 g/dL (ref 30.0–36.0)
MCV: 71.7 fL — ABNORMAL LOW (ref 78.0–100.0)
MCV: 71.6 fL — ABNORMAL LOW (ref 78.0–100.0)
MONOS PCT: 4 % (ref 3–12)
Monocytes Absolute: 0.8 10*3/uL (ref 0.1–1.0)
Monocytes Absolute: 1.1 10*3/uL — ABNORMAL HIGH (ref 0.1–1.0)
Monocytes Relative: 4 % (ref 3–12)
NEUTROS ABS: 25.3 10*3/uL — AB (ref 1.7–7.7)
Neutro Abs: 17.1 10*3/uL — ABNORMAL HIGH (ref 1.7–7.7)
Neutrophils Relative %: 87 % — ABNORMAL HIGH (ref 43–77)
Neutrophils Relative %: 91 % — ABNORMAL HIGH (ref 43–77)
PLATELETS: 477 10*3/uL — AB (ref 150–400)
PLATELETS: 421 10*3/uL — AB (ref 150–400)
RBC: 5.55 MIL/uL (ref 4.22–5.81)
RBC: 4.72 MIL/uL (ref 4.22–5.81)
RDW: 17.7 % — ABNORMAL HIGH (ref 11.5–15.5)
RDW: 17.9 % — ABNORMAL HIGH (ref 11.5–15.5)
WBC: 19.6 10*3/uL — ABNORMAL HIGH (ref 4.0–10.5)
WBC: 27.8 10*3/uL — ABNORMAL HIGH (ref 4.0–10.5)

## 2014-03-18 LAB — COMPREHENSIVE METABOLIC PANEL
ALT: 9 U/L (ref 0–53)
ANION GAP: 7 (ref 5–15)
AST: 17 U/L (ref 0–37)
Albumin: 2.3 g/dL — ABNORMAL LOW (ref 3.5–5.2)
Alkaline Phosphatase: 137 U/L — ABNORMAL HIGH (ref 39–117)
BUN: 9 mg/dL (ref 6–23)
CALCIUM: 8.2 mg/dL — AB (ref 8.4–10.5)
CO2: 28 mmol/L (ref 19–32)
Chloride: 100 mEq/L (ref 96–112)
Creatinine, Ser: 0.78 mg/dL (ref 0.50–1.35)
GFR calc non Af Amer: >90 mL/min (ref >90)
GLUCOSE: 343 mg/dL — AB (ref 70–99)
Potassium: 3.7 mmol/L (ref 3.5–5.1)
Sodium: 135 mmol/L (ref 135–145)
TOTAL PROTEIN: 6.3 g/dL (ref 6.0–8.3)
Total Bilirubin: 0.4 mg/dL (ref 0.3–1.2)

## 2014-03-18 LAB — URINE CULTURE

## 2014-03-18 LAB — GLUCOSE, CAPILLARY
GLUCOSE-CAPILLARY: 420 mg/dL — AB (ref 70–99)
GLUCOSE-CAPILLARY: 461 mg/dL — AB (ref 70–99)
GLUCOSE-CAPILLARY: 124 mg/dL — AB (ref 70–99)
Glucose-Capillary: 366 mg/dL — ABNORMAL HIGH (ref 70–99)

## 2014-03-18 LAB — BASIC METABOLIC PANEL
Anion gap: 8 (ref 5–15)
BUN: 8 mg/dL (ref 6–23)
CHLORIDE: 97 meq/L (ref 96–112)
CO2: 30 mmol/L (ref 19–32)
Calcium: 8.7 mg/dL (ref 8.4–10.5)
Creatinine, Ser: 0.77 mg/dL (ref 0.50–1.35)
GFR calc non Af Amer: >90 mL/min (ref >90)
Glucose, Bld: 264 mg/dL — ABNORMAL HIGH (ref 70–99)
POTASSIUM: 4.1 mmol/L (ref 3.5–5.1)
SODIUM: 135 mmol/L (ref 135–145)

## 2014-03-18 MED ORDER — SODIUM CHLORIDE 0.9 % IV BOLUS (SEPSIS)
500.0000 mL | Freq: Once | INTRAVENOUS | Status: AC
  Administered 2014-03-18: 500 mL via INTRAVENOUS

## 2014-03-18 MED ORDER — INSULIN ASPART 100 UNIT/ML ~~LOC~~ SOLN
0.0000 [IU] | SUBCUTANEOUS | Status: DC
  Administered 2014-03-18: 1 [IU] via SUBCUTANEOUS
  Administered 2014-03-18 (×2): 9 [IU] via SUBCUTANEOUS
  Administered 2014-03-19: 2 [IU] via SUBCUTANEOUS
  Administered 2014-03-19 – 2014-03-20 (×4): 3 [IU] via SUBCUTANEOUS
  Administered 2014-03-20: 2 [IU] via SUBCUTANEOUS

## 2014-03-18 MED ORDER — LINEZOLID 2 MG/ML IV SOLN
600.0000 mg | Freq: Once | INTRAVENOUS | Status: AC
  Administered 2014-03-19: 600 mg via INTRAVENOUS
  Filled 2014-03-18: qty 300

## 2014-03-18 MED ORDER — INSULIN DETEMIR 100 UNIT/ML ~~LOC~~ SOLN
5.0000 [IU] | Freq: Every day | SUBCUTANEOUS | Status: DC
  Administered 2014-03-18 – 2014-03-19 (×2): 5 [IU] via SUBCUTANEOUS
  Filled 2014-03-18 (×5): qty 0.05

## 2014-03-18 NOTE — Progress Notes (Signed)
Patient has attempted several times to climb out of bed. Patient noted with legs over bed rail. Patient continues slid down in the bed. Patient states he wants to sit on the porch. Reoriented patient to his surrounding. Patient attempting to removed mittens from hands. No vomiting noted post bolus tube feeding. Patient congested .

## 2014-03-18 NOTE — Progress Notes (Signed)
[name] OAC:[id number] DOB: Apr 30, 1950 DOA: 03/15/2014 PCP: Jim Regal, MD  Brief narrative: 63 y/o ? Recent admission 12/14-02/2214 for aspiration pneumonia in the setting of choking. H/o failure to thrive, seizure disorder, hypertension, diabetes mellitus uncontrolled, hyperlipidemia, continued tobacco abuse, liver cirrhosis, malnutrition Prior hospital stay  CT of the chest showed pulmonary nodules and CT neck revealed advanced latency of the neck-cervical lymph node biopsy 12/16 = squamous cell CA EGD 12/17 = none bleeding dysphagia varices grade 2 Colonoscopy 12/21 revealed near obstructing cecal mass and she'll subsequently placed for nutritional support Admitted to step down unit 12/27 with profound hypoglycemia in the 30s Presented to the emergency room and desatted to the  60 percentile and needed to be aggressively managed with BiPAP also found to be hypothermic at 35 and placed on Bair hugger Initial labs showed lactic acidosis 2.1, pro calcitonin 5 PaO2 44 on 100% oxygen FiO2 and pH 7.4 Portal chest x-ray = right airspace disease versus consolidation  Past medical history-As per Problem list Chart reviewed as below- reviewed  Consultants:  pulmonary  Procedures:  none  Antibiotics:  IV Zosyn 12/27   Subjective   Much improved, + somewhat agitated trying to get out of bed Nothing by mouth and is on tube feeds Still has copious secretion  No family at bedside No fever no chills   Objective    Interim History: none  Telemetry: Normal sinus rhythm 80s   Objective: Filed Vitals:   03/17/14 1400 03/17/14 1630 03/17/14 2000 03/18/14 0507  BP: 138/100  140/86 109/66  Pulse: 103  97 109  Temp:  98.3 F (36.8 C) 97.3 F (36.3 C) 97.8 F (36.6 C)  TempSrc:  Oral Oral Oral  Resp:   21 20  Height:      Weight:    49.624 kg (109 lb 6.4 oz)  SpO2: 100%  100% 96%    Intake/Output Summary (Last 24 hours) at 03/18/14 1717 Last data filed at  03/18/14 1600  Gross per 24 hour  Intake    948 ml  Output    425 ml  Net    523 ml    Exam:  General: alert to some extent unclear how oriented he has. Facial droop noted on left side. XL  Exostosis of the left eye, Cardiovascular: S1-S2 no murmur rub or gallop Respiratory: clinically clear Abdomen: soft no rebound no guarding large scrotal hernia, GI tube in place [facility] lower extremity edema Neurointact  Data Reviewed: Basic Metabolic Panel:  Recent Labs Lab 03/15/14 1140 03/16/14 0514 03/18/14 0646  NA 131* 133* 135  K 4.3 3.5 4.1  CL 95* 100 97  CO2 30 26 30   GLUCOSE 192* 134* 264*  BUN 12 9 8   CREATININE 0.81 0.74 0.77  CALCIUM 8.6 8.0* 8.7   Liver Function Tests: No results for input(s): AST, ALT, ALKPHOS, BILITOT, PROT, ALBUMIN in the last 168 hours. No results for input(s): LIPASE, AMYLASE in the last 168 hours. No results for input(s): AMMONIA in the last 168 hours. CBC:  Recent Labs Lab 03/15/14 1140 03/18/14 0646  WBC 14.9* 19.6*  NEUTROABS 13.7* 17.1*  HGB 12.1* 12.8*  HCT 37.0* 39.8  MCV 71.7* 71.7*  PLT 461* 477*   Cardiac Enzymes: No results for input(s): CKTOTAL, CKMB, CKMBINDEX, TROPONINI in the last 168 hours. BNP: Invalid input(s): POCBNP CBG:  Recent Labs Lab 03/17/14 1141 03/17/14 1627 03/18/14 1221 03/18/14 1311 03/18/14 1649  GLUCAP 155* 122* 420* 461* 366*  Recent Results (from the past 240 hour(s))  Blood Culture (routine x 2)     Status: None (Preliminary result)   Collection Time: 03/15/14 12:42 PM  Result Value Ref Range Status   Specimen Description BLOOD LEFT HAND  Final   Special Requests BOTTLES DRAWN AEROBIC AND ANAEROBIC 8CC BOTTLES  Final   Culture NO GROWTH 3 DAYS  Final   Report Status PENDING  Incomplete  Blood Culture (routine x 2)     Status: None (Preliminary result)   Collection Time: 03/15/14 12:42 PM  Result Value Ref Range Status   Specimen Description BLOOD RIGHT HAND DRAWN BY RN  Final    Special Requests BOTTLES DRAWN AEROBIC AND ANAEROBIC 6CC BOTTLES  Final   Culture NO GROWTH 3 DAYS  Final   Report Status PENDING  Incomplete  Urine culture     Status: None (Preliminary result)   Collection Time: 03/15/14  1:08 PM  Result Value Ref Range Status   Specimen Description URINE, CATHETERIZED  Final   Special Requests NONE  Final   Colony Count   Final    25,000 COLONIES/ML Performed at [facility]    Culture   Final    ENTEROCOCCUS SPECIES Performed at [facility]    Report Status PENDING  Incomplete  MRSA PCR Screening     Status: None   Collection Time: 03/15/14  5:19 PM  Result Value Ref Range Status   MRSA by PCR NEGATIVE NEGATIVE Final    Comment:        The GeneXpert MRSA Assay (FDA approved for NASAL specimens only), is one component of a comprehensive MRSA colonization surveillance program. It is not intended to diagnose MRSA infection nor to guide or monitor treatment for MRSA infections.      Studies:              All Imaging reviewed and is as per above notation   Scheduled Meds: . antiseptic oral rinse  7 mL Mouth Rinse BID  . clindamycin  300 mg Oral 3 times per day  . feeding supplement (JEVITY 1.2 CAL)  474 mL Per Tube TID  . heparin  5,000 Units Subcutaneous 3 times per day  . insulin aspart  0-9 Units Subcutaneous 6 times per day  . insulin detemir  5 Units Subcutaneous QHS  . nicotine  14 mg Transdermal Daily   Continuous Infusions:     Assessment/Plan:  1. Severe sepsis on admission with hypotension, hypothermia ,  SIRS from malignancy vs etiology aspiration transitioned patient-From IV antibiotics to by mouth clindamycin via tube 300 every 8 hourly . He will need 10-14 days of this  2. Profound hypoglycemia on admission secondary to poor by mouth intake necessitating bear hugger-now resolved 3. Acute respiratory 2/2  aspiration pneumonia.  noncompliant with feeding tube feeds. Nutritionist input appreciated   Weaned to room air 12/29 and not hypoxic. Chest x-ray as below. 4.  hyponatremia - probably secondary to  Very poor by mouth intake. Improving slightly--currently 135, on admission 129 5. Potential aspiration pneumonia versus metastatic lung disease from colon/ neck primary-2 view chest x-ray 12/28 confirms nodule which leads me to think this is more oncological.  Speech therapy evaluation ordered today 12/29 nonemergent-I've made him nothing by mouth as he is overtly having secretions and coughs and I do not think he is safe to eat, transition to clindamycin via PEG tube once initiated 6. 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  Janeal Holmes attempted to meet with his family multiple times and they're not able to make it to the hospital because of inclement weather and lack of transport.-I will be discharging him tomorrow with outpatient social worker and hospice RN to visit him at home  Code Status: currently full code Family Communication: none at bedside.   Disposition Plan:Discharge 03/19/14 once appropriate arrangements for transport, right feeds as well as hospice RN to visit her home have been made.  Verneita Griffes, MD  [facility] Pager [phone] 03/18/2014, 5:17 PM    LOS: 3 days

## 2014-03-18 NOTE — Progress Notes (Signed)
NUTRITION FOLLOW UP- INTERVENTION:  Bolus feedings initiated: Jevity 1.2 (474 ml ) TID add 120 ml flushes TID Provides: 1710 kcal, 79 gr protein and 1146 ml of water and 1506 ml daily with flushes added.  Consult diabetes coordinator to review insulin regimen   Nutrition Dx:   Inadequate oral intake related to dysphagia as evidenced by swallow evaluation and severe wasting clavicles, patella, quadricept regions, 15% wt loss x 7 months; ongoing  Goal:  Pt to meet >/= 90% of their estimated nutrition needs; progressing  Monitor:   Nutrition support tolerance, Protein-energy intake, labs and wt trends   Assessment:   Pt tolerating tube feeding today with 0 residuals. Large watery stool noted per RN. Pt denies nausea. ST completed bedside swallow evaluation last evening. Recommends NPO with PEG for now pending further changes in care goals (potential transition to Hospice). Will continue current enteral  feeding regimen for now.  Contacted diabetes coordinator secondary to increased CBG's today -420, 461 mg/dl.   Height: Ht Readings from Last 1 Encounters:  03/16/14 6' (1.829 m)    Weight Status:   Wt Readings from Last 1 Encounters:  03/18/14 109 lb 6.4 oz (49.624 kg)    Re-estimated needs:  Kcal: 1800-2080 ( to promote gradual wt gain) Protein: 75-85 gr Fluid: >1500 ml daily  Skin: Intact  Diet Order: Diet NPO time specified   Intake/Output Summary (Last 24 hours) at 03/18/14 1432 Last data filed at 03/18/14 0900  Gross per 24 hour  Intake      0 ml  Output    425 ml  Net   -425 ml    Last BM: 12/30    Labs:   Recent Labs Lab 03/15/14 1140 03/16/14 0514 03/18/14 0646  NA 131* 133* 135  K 4.3 3.5 4.1  CL 95* 100 97  CO2 30 26 30   BUN 12 9 8   CREATININE 0.81 0.74 0.77  CALCIUM 8.6 8.0* 8.7  GLUCOSE 192* 134* 264*    CBG (last 3)   Recent Labs  03/17/14 1627 03/18/14 1221 03/18/14 1311  GLUCAP 122* 420* 461*    Scheduled Meds: . antiseptic  oral rinse  7 mL Mouth Rinse BID  . clindamycin  300 mg Oral 3 times per day  . feeding supplement (JEVITY 1.2 CAL)  474 mL Per Tube TID  . heparin  5,000 Units Subcutaneous 3 times per day  . insulin aspart  0-9 Units Subcutaneous 6 times per day  . insulin detemir  5 Units Subcutaneous QHS  . nicotine  14 mg Transdermal Daily    Continuous Infusions:    Colman Cater MS,RD,CSG,LDN Office: [phone] Pager: [phone]

## 2014-03-18 NOTE — Progress Notes (Signed)
ANTIBIOTIC CONSULT NOTE-Preliminary  Pharmacy Consult for linezolid Indication:Vancomycin resistant enterococcus UTI  No Known Allergies  Patient Measurements: Height: 6' (182.9 cm) Weight: 109 lb 6.4 oz (49.624 kg) IBW/kg (Calculated) : 77.6   Vital Signs: Temp: 98.9 F (37.2 C) (12/30 2227) Temp Source: Oral (12/30 2227) BP: 87/60 mmHg (12/30 2227) Pulse Rate: 109 (12/30 2227)  Labs:  Recent Labs  03/16/14 0514 03/18/14 0646 03/18/14 1734  WBC  --  19.6* 27.8*  HGB  --  12.8* 10.9*  PLT  --  477* 421*  CREATININE 0.74 0.77 0.78    Estimated Creatinine Clearance: 64.6 mL/min (by C-G formula based on Cr of 0.78).  No results for input(s): VANCOTROUGH, VANCOPEAK, VANCORANDOM, GENTTROUGH, GENTPEAK, GENTRANDOM, TOBRATROUGH, TOBRAPEAK, TOBRARND, AMIKACINPEAK, AMIKACINTROU, AMIKACIN in the last 72 hours.   Microbiology: Recent Results (from the past 720 hour(s))  Culture, blood (routine x 2) Call MD if unable to obtain prior to antibiotics being given     Status: None   Collection Time: 03/02/14 11:23 PM  Result Value Ref Range Status   Specimen Description BLOOD RIGHT ANTECUBITAL  Final   Special Requests BOTTLES DRAWN AEROBIC AND ANAEROBIC [place]  Final   Culture NO GROWTH 5 DAYS  Final   Report Status 03/07/2014 FINAL  Final  Culture, blood (routine x 2) Call MD if unable to obtain prior to antibiotics being given     Status: None   Collection Time: 03/02/14 11:23 PM  Result Value Ref Range Status   Specimen Description BLOOD RIGHT ANTECUBITAL  Final   Special Requests   Final    BOTTLES DRAWN AEROBIC AND ANAEROBIC AEB=6CC ANA=4CC   Culture  Setup Time   Final    03/04/2014 14:50 Performed at [facility]    Culture   Final    STAPHYLOCOCCUS SPECIES (COAGULASE NEGATIVE) Note: THE SIGNIFICANCE OF ISOLATING THIS ORGANISM FROM A SINGLE VENIPUNCTURE CANNOT BE PREDICTED WITHOUT FURTHER CLINICAL AND CULTURE CORRELATION. SUSCEPTIBILITIES AVAILABLE ONLY ON  REQUEST. Note: Gram Stain Report Called to,Read Back By and Verified With: THOMAS [id number]@[id number]  ON 03/03/14 BY FORSYTHK Performed at [facility] Performed at [facility]    Report Status 03/05/2014 FINAL  Final  MRSA PCR Screening     Status: None   Collection Time: 03/03/14 12:10 AM  Result Value Ref Range Status   MRSA by PCR NEGATIVE NEGATIVE Final    Comment:        The GeneXpert MRSA Assay (FDA approved for NASAL specimens only), is one component of a comprehensive MRSA colonization surveillance program. It is not intended to diagnose MRSA infection nor to guide or monitor treatment for MRSA infections.   Clostridium Difficile by PCR     Status: None   Collection Time: 03/06/14  3:20 PM  Result Value Ref Range Status   C difficile by pcr NEGATIVE NEGATIVE Final  Blood Culture (routine x 2)     Status: None (Preliminary result)   Collection Time: 03/15/14 12:42 PM  Result Value Ref Range Status   Specimen Description BLOOD LEFT HAND  Final   Special Requests BOTTLES DRAWN AEROBIC AND ANAEROBIC 8CC BOTTLES  Final   Culture NO GROWTH 3 DAYS  Final   Report Status PENDING  Incomplete  Blood Culture (routine x 2)     Status: None (Preliminary result)   Collection Time: 03/15/14 12:42 PM  Result Value Ref Range Status   Specimen Description BLOOD RIGHT HAND DRAWN BY RN  Final   Special Requests  BOTTLES DRAWN AEROBIC AND ANAEROBIC 6CC BOTTLES  Final   Culture NO GROWTH 3 DAYS  Final   Report Status PENDING  Incomplete  Urine culture     Status: None   Collection Time: 03/15/14  1:08 PM  Result Value Ref Range Status   Specimen Description URINE, CATHETERIZED  Final   Special Requests NONE  Final   Colony Count   Final    25,000 COLONIES/ML Performed at [facility]    Culture   Final    VANCOMYCIN RESISTANT ENTEROCOCCUS ISOLATED Note: CRITICAL RESULT CALLED TO, READ BACK BY AND VERIFIED WITH: BREE JOHNSON AT 10:31 P.M. ON 03/18/2014 WARRB Performed at  [facility]    Report Status 03/18/2014 FINAL  Final   Organism ID, Bacteria VANCOMYCIN RESISTANT ENTEROCOCCUS ISOLATED  Final      Susceptibility   Vancomycin resistant enterococcus isolated - MIC*    AMPICILLIN RESISTANT      LEVOFLOXACIN >=8 RESISTANT Resistant     NITROFURANTOIN <=16 SENSITIVE Sensitive     VANCOMYCIN 256 RESISTANT Resistant     TETRACYCLINE >=16 RESISTANT Resistant     LINEZOLID 1 SENSITIVE Sensitive     * VANCOMYCIN RESISTANT ENTEROCOCCUS ISOLATED  MRSA PCR Screening     Status: None   Collection Time: 03/15/14  5:19 PM  Result Value Ref Range Status   MRSA by PCR NEGATIVE NEGATIVE Final    Comment:        The GeneXpert MRSA Assay (FDA approved for NASAL specimens only), is one component of a comprehensive MRSA colonization surveillance program. It is not intended to diagnose MRSA infection nor to guide or monitor treatment for MRSA infections.     Medical History: Past Medical History  Diagnosis Date  . Diabetes mellitus   . Liver disease   . Enlarged prostate   . Hernia   . Alcohol dependence   . Seizures   . Hypercholesterolemia   . Liver lesion   . Pulmonary nodules   . Cervical mass     biopsy 03/04/14  . Metastatic disease   . Anemia   . Malnutrition   . Esophageal varices   . Hydronephrosis   . Cholelithiasis     Medications:  Clindamycin 300 mg PO three times daily  Assessment: 63 yo male s/p recent hospitalization for aspiration pneumonia; multiple medical problems. Catheterized urine positive for VRE reported to midlevel provider on 03/18/14 at 2230. Will begin IV linezolid pending ID review.  Goal of Therapy: Eradicate infection  Plan:  Preliminary review of pertinent patient information completed.  Protocol will be initiated with a one-time dose of linezolid 600 mg IV.  Forestine Na clinical pharmacist will complete review during morning rounds to assess patient and finalize treatment regimen.  Norberto Sorenson,  [facility] 03/18/2014,11:49 PM

## 2014-03-18 NOTE — Progress Notes (Signed)
CRITICAL VALUE ALERT  Critical value received:  vre in urine  Date of notification:  03-18-14  Time of notification:  2245  Critical value read back: yes  Nurse who received alert: b Milana Obey   MD notified (1st page): [email or web address], midlevel np  Time of first page:  2250  MD notified (2nd page):  Time of second page:  Responding MD:  Tylene Fantasia  Time MD responded: 2255

## 2014-03-18 NOTE — Progress Notes (Addendum)
He has been transferred to the floor. He continues to be somewhat incoherent and he is still very congested. His gastric residual is 0 but I'm still concerned that he may be chronically aspirating his secretions if not his tube feeding.  He is awake and alert very difficult to understand both because of secretions and his previous stroke. His pulse is 109 respirations 20 blood pressure 109/66 oxygen saturation 96% and his temperature 97.8. His chest shows significant bilateral rhonchi. He has a lot of airway noise over his trachea. Laboratory work shows his white count is 19,600 hemoglobin 12.8 electrolytes are normal.  I reviewed Dr. Arlyss Queen note from yesterday. I agree he is a hospice candidate. 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 I don't think he would tolerate chemotherapy. I don't think he is a surgical candidate.  I gave an order for when necessary suctioning because of his severe problem with secretions. I agree with hospice consultation. If his family is still having difficulty coming to a decision it may be worth having oncology see him to confirm whether he is a candidate for any sort of treatment or not

## 2014-03-18 NOTE — Progress Notes (Signed)
Patient incont of large watery stool.

## 2014-03-19 DIAGNOSIS — E876 Hypokalemia: Secondary | ICD-10-CM

## 2014-03-19 LAB — GLUCOSE, CAPILLARY
GLUCOSE-CAPILLARY: 208 mg/dL — AB (ref 70–99)
Glucose-Capillary: 100 mg/dL — ABNORMAL HIGH (ref 70–99)
Glucose-Capillary: 110 mg/dL — ABNORMAL HIGH (ref 70–99)
Glucose-Capillary: 160 mg/dL — ABNORMAL HIGH (ref 70–99)
Glucose-Capillary: 213 mg/dL — ABNORMAL HIGH (ref 70–99)
Glucose-Capillary: 248 mg/dL — ABNORMAL HIGH (ref 70–99)

## 2014-03-19 MED ORDER — LINEZOLID 600 MG PO TABS
600.0000 mg | ORAL_TABLET | Freq: Two times a day (BID) | ORAL | Status: DC
  Administered 2014-03-19 – 2014-03-20 (×3): 600 mg via ORAL
  Filled 2014-03-19 (×9): qty 1

## 2014-03-19 MED ORDER — LINEZOLID 2 MG/ML IV SOLN
INTRAVENOUS | Status: AC
  Filled 2014-03-19: qty 300

## 2014-03-19 MED ORDER — LINEZOLID 2 MG/ML IV SOLN: 600.0000 mg | Freq: Two times a day (BID) | INTRAVENOUS | Status: DC

## 2014-03-19 NOTE — Progress Notes (Signed)
Inpatient Diabetes Program Recommendations  AACE/ADA: New Consensus Statement on Inpatient Glycemic Control (2013)  Target Ranges:  Prepandial:   less than 140 mg/dL      Peak postprandial:   less than 180 mg/dL (1-2 hours)      Critically ill patients:  140 - 180 mg/dL   Results for TEE, RICHESON (MRN [id number]) as of 03/19/2014 08:01  Ref. Range 03/18/2014 12:21 03/18/2014 13:11 03/18/2014 16:49 03/18/2014 20:13 03/19/2014 00:35 03/19/2014 03:44  Glucose-Capillary Latest Range: 70-99 mg/dL 420 (H) 461 (H) 366 (H) 124 (H) 100 (H) 213 (H)   Current orders for Inpatient glycemic control: Levemir 5 units QHS, Novolog 0-9 units Q4H  Inpatient Diabetes Program Recommendations Diet: Noted Jevity 474 ml TID bolus feeds which is contributing to elevated glucose. If appropriate, may want to consider changing tube feeding to Glucerna. Noted RN note regarding high tube feeding residuals; may want to consider changing to continuous feeds versus bolus feedings.   Thanks, Barnie Alderman, RN, MSN, CCRN, CDE Diabetes Coordinator Inpatient Diabetes Program [phone] (Team Pager) [phone] ([facility] office) [phone] [facility] office)

## 2014-03-19 NOTE — Progress Notes (Signed)
ANTIBIOTIC CONSULT NOTE- follow up  Pharmacy Consult for linezolid Indication:Vancomycin resistant enterococcus UTI  No Known Allergies  Patient Measurements: Height: 6' (182.9 cm) Weight: 122 lb 5.7 oz (55.5 kg) IBW/kg (Calculated) : 77.6  Vital Signs: Temp: 98.8 F (37.1 C) (12/31 0605) Temp Source: Oral (12/31 0605) BP: 115/60 mmHg (12/31 0605) Pulse Rate: 97 (12/31 0605)  Labs:  Recent Labs  03/18/14 0646 03/18/14 1734  WBC 19.6* 27.8*  HGB 12.8* 10.9*  PLT 477* 421*  CREATININE 0.77 0.78   Estimated Creatinine Clearance: 72.3 mL/min (by C-G formula based on Cr of 0.78).  No results for input(s): VANCOTROUGH, VANCOPEAK, VANCORANDOM, GENTTROUGH, GENTPEAK, GENTRANDOM, TOBRATROUGH, TOBRAPEAK, TOBRARND, AMIKACINPEAK, AMIKACINTROU, AMIKACIN in the last 72 hours.   Microbiology: Recent Results (from the past 720 hour(s))  Culture, blood (routine x 2) Call MD if unable to obtain prior to antibiotics being given     Status: None   Collection Time: 03/02/14 11:23 PM  Result Value Ref Range Status   Specimen Description BLOOD RIGHT ANTECUBITAL  Final   Special Requests BOTTLES DRAWN AEROBIC AND ANAEROBIC [place]  Final   Culture NO GROWTH 5 DAYS  Final   Report Status 03/07/2014 FINAL  Final  Culture, blood (routine x 2) Call MD if unable to obtain prior to antibiotics being given     Status: None   Collection Time: 03/02/14 11:23 PM  Result Value Ref Range Status   Specimen Description BLOOD RIGHT ANTECUBITAL  Final   Special Requests   Final    BOTTLES DRAWN AEROBIC AND ANAEROBIC AEB=6CC ANA=4CC   Culture  Setup Time   Final    03/04/2014 14:50 Performed at [facility]    Culture   Final    STAPHYLOCOCCUS SPECIES (COAGULASE NEGATIVE) Note: THE SIGNIFICANCE OF ISOLATING THIS ORGANISM FROM A SINGLE VENIPUNCTURE CANNOT BE PREDICTED WITHOUT FURTHER CLINICAL AND CULTURE CORRELATION. SUSCEPTIBILITIES AVAILABLE ONLY ON REQUEST. Note: Gram Stain Report Called  to,Read Back By and Verified With: THOMAS [id number]@[id number]  ON 03/03/14 BY FORSYTHK Performed at [facility] Performed at [facility]    Report Status 03/05/2014 FINAL  Final  MRSA PCR Screening     Status: None   Collection Time: 03/03/14 12:10 AM  Result Value Ref Range Status   MRSA by PCR NEGATIVE NEGATIVE Final    Comment:        The GeneXpert MRSA Assay (FDA approved for NASAL specimens only), is one component of a comprehensive MRSA colonization surveillance program. It is not intended to diagnose MRSA infection nor to guide or monitor treatment for MRSA infections.   Clostridium Difficile by PCR     Status: None   Collection Time: 03/06/14  3:20 PM  Result Value Ref Range Status   C difficile by pcr NEGATIVE NEGATIVE Final  Blood Culture (routine x 2)     Status: None (Preliminary result)   Collection Time: 03/15/14 12:42 PM  Result Value Ref Range Status   Specimen Description BLOOD LEFT HAND  Final   Special Requests BOTTLES DRAWN AEROBIC AND ANAEROBIC 8CC BOTTLES  Final   Culture NO GROWTH 3 DAYS  Final   Report Status PENDING  Incomplete  Blood Culture (routine x 2)     Status: None (Preliminary result)   Collection Time: 03/15/14 12:42 PM  Result Value Ref Range Status   Specimen Description BLOOD RIGHT HAND DRAWN BY RN  Final   Special Requests BOTTLES DRAWN AEROBIC AND ANAEROBIC 6CC BOTTLES  Final   Culture  NO GROWTH 3 DAYS  Final   Report Status PENDING  Incomplete  Urine culture     Status: None   Collection Time: 03/15/14  1:08 PM  Result Value Ref Range Status   Specimen Description URINE, CATHETERIZED  Final   Special Requests NONE  Final   Colony Count   Final    25,000 COLONIES/ML Performed at [facility]    Culture   Final    VANCOMYCIN RESISTANT ENTEROCOCCUS ISOLATED Note: CRITICAL RESULT CALLED TO, READ BACK BY AND VERIFIED WITH: BREE JOHNSON AT 10:31 P.M. ON 03/18/2014 WARRB Performed at [facility]    Report Status  03/18/2014 FINAL  Final   Organism ID, Bacteria VANCOMYCIN RESISTANT ENTEROCOCCUS ISOLATED  Final      Susceptibility   Vancomycin resistant enterococcus isolated - MIC*    AMPICILLIN RESISTANT      LEVOFLOXACIN >=8 RESISTANT Resistant     NITROFURANTOIN <=16 SENSITIVE Sensitive     VANCOMYCIN 256 RESISTANT Resistant     TETRACYCLINE >=16 RESISTANT Resistant     LINEZOLID 1 SENSITIVE Sensitive     * VANCOMYCIN RESISTANT ENTEROCOCCUS ISOLATED  MRSA PCR Screening     Status: None   Collection Time: 03/15/14  5:19 PM  Result Value Ref Range Status   MRSA by PCR NEGATIVE NEGATIVE Final    Comment:        The GeneXpert MRSA Assay (FDA approved for NASAL specimens only), is one component of a comprehensive MRSA colonization surveillance program. It is not intended to diagnose MRSA infection nor to guide or monitor treatment for MRSA infections.     Medical History: Past Medical History  Diagnosis Date  . Diabetes mellitus   . Liver disease   . Enlarged prostate   . Hernia   . Alcohol dependence   . Seizures   . Hypercholesterolemia   . Liver lesion   . Pulmonary nodules   . Cervical mass     biopsy 03/04/14  . Metastatic disease   . Anemia   . Malnutrition   . Esophageal varices   . Hydronephrosis   . Cholelithiasis    Anti-infectives    Start     Dose/Rate Route Frequency Ordered Stop   03/19/14 1000  linezolid (ZYVOX) tablet 600 mg     600 mg Oral Every 12 hours 03/19/14 0806     03/18/14 2345  linezolid (ZYVOX) IVPB 600 mg     600 mg300 mL/hr over 60 Minutes Intravenous  Once 03/18/14 2344 03/19/14 0241   03/17/14 1500  clindamycin (CLEOCIN) 75 MG/5ML solution 300 mg     300 mg Oral 3 times per day 03/17/14 1318     03/16/14 0100  vancomycin (VANCOCIN) IVPB 750 mg/150 ml premix  Status:  Discontinued     750 mg150 mL/hr over 60 Minutes Intravenous Every 12 hours 03/15/14 1819 03/17/14 1318   03/15/14 2200  piperacillin-tazobactam (ZOSYN) IVPB 3.375 g  Status:   Discontinued     3.375 g12.5 mL/hr over 240 Minutes Intravenous Every 8 hours 03/15/14 1819 03/17/14 1318   03/15/14 1245  ceFEPIme (MAXIPIME) 2 g in dextrose 5 % 50 mL IVPB     2 g100 mL/hr over 30 Minutes Intravenous  Once 03/15/14 1235 03/15/14 1340   03/15/14 1245  vancomycin (VANCOCIN) IVPB 1000 mg/200 mL premix  Status:  Discontinued     1,000 mg200 mL/hr over 60 Minutes Intravenous  Once 03/15/14 1235 03/15/14 1819     Assessment: 63 yo  male s/p recent hospitalization for aspiration pneumonia; multiple medical problems. Catheterized urine positive for VRE reported to midlevel provider on 03/18/14 at 2230. Will begin IV linezolid pending ID review.  Goal of Therapy: Eradicate infection  Plan:  Preliminary review of pertinent patient information completed.  Protocol will be initiated with a one-time dose of linezolid 600 mg IV then Zyvox 600mg  PO q12hrs. F/U recommendations and plan per ID team.  Hart Robinsons A,  [facility] 03/19/2014,8:07 AM

## 2014-03-19 NOTE — Progress Notes (Signed)
NUTRITION FOLLOW UP- INTERVENTION: If pt chooses to continue nutrition support at discharge: Change bolus feeding schedule to QID to more adequately distribute nutrients and carbohydrates: Jevity 1.2 (387ml) QID add 120 ml flushes QID  If pt is not going to be ambulating then continuous feeding is preferred and suggest Jevity 1.2 @ goal rate of 65 ml/hr which will provide 1872 kcal, 86 gr protein and1259 ml water. Continue free water flushes 120 ml TID.  Nutrition Dx:   Inadequate oral intake related to dysphagia as evidenced by swallow evaluation and severe wasting clavicles, patella, quadricept regions, 15% wt loss x 7 months; ongoing  Goal:  Pt to meet >/= 90% of their estimated nutrition needs; progressing  Monitor:   Nutrition support tolerance, Protein-energy intake, labs and wt trends   Assessment:   Pt to d/c today with Hospice referral. Nursing note reviewed concerning residuals.  Nutrition support protocol threshold for "high residual" 400 ml.   Height: Ht Readings from Last 1 Encounters:  03/16/14 6' (1.829 m)    Weight Status:   Wt Readings from Last 1 Encounters:  03/19/14 122 lb 5.7 oz (55.5 kg)    Re-estimated needs:  Kcal: 1800-2080 Protein: 75-85 gr Fluid: >1500 ml daily  Skin: Intact  Diet Order: Diet NPO time specified   Intake/Output Summary (Last 24 hours) at 03/19/14 0758 Last data filed at 03/19/14 3220  Gross per 24 hour  Intake    948 ml  Output    700 ml  Net    248 ml    Last BM: 12/30    Labs:   Recent Labs Lab 03/16/14 0514 03/18/14 0646 03/18/14 1734  NA 133* 135 135  K 3.5 4.1 3.7  CL 100 97 100  CO2 26 30 28   BUN 9 8 9   CREATININE 0.74 0.77 0.78  CALCIUM 8.0* 8.7 8.2*  GLUCOSE 134* 264* 343*    CBG (last 3)   Recent Labs  03/18/14 2013 03/19/14 0035 03/19/14 0344  GLUCAP 124* 100* 213*    Scheduled Meds: . antiseptic oral rinse  7 mL Mouth Rinse BID  . clindamycin  300 mg Oral 3 times per day  . feeding  supplement (JEVITY 1.2 CAL)  474 mL Per Tube TID  . heparin  5,000 Units Subcutaneous 3 times per day  . insulin aspart  0-9 Units Subcutaneous 6 times per day  . insulin detemir  5 Units Subcutaneous QHS  . nicotine  14 mg Transdermal Daily    Continuous Infusions:    Colman Cater MS,RD,CSG,LDN Office: [phone] Pager: [phone]

## 2014-03-19 NOTE — Progress Notes (Signed)
[name] JHE:[id number] DOB: 01/14/1951 DOA: 03/15/2014 PCP: [name], MD  Brief narrative: 63 y/o ? Recent admission 12/14-02/2214 for aspiration pneumonia in the setting of choking. H/o failure to thrive, seizure disorder, hypertension, diabetes mellitus uncontrolled, hyperlipidemia, continued tobacco abuse, liver cirrhosis, malnutrition Prior hospital stay  CT of the chest showed pulmonary nodules and CT neck revealed advanced latency of the neck-cervical lymph node biopsy 12/16 = squamous cell CA EGD 12/17 = none bleeding dysphagia varices grade 2 Colonoscopy 12/21 revealed near obstructing cecal mass and she'll subsequently placed for nutritional support Admitted to step down unit 12/27 with profound hypoglycemia in the 30s Presented to the emergency room and desatted to the  60 percentile and needed to be aggressively managed with BiPAP also found to be hypothermic at 35 and placed on Bair hugger Initial labs showed lactic acidosis 2.1, pro calcitonin 5 PaO2 44 on 100% oxygen FiO2 and pH 7.4 Portal chest x-ray = right airspace disease versus consolidation  Past medical history-As per Problem list Chart reviewed as below- reviewed  Consultants:  pulmonary  Procedures:  none  Antibiotics:  IV Zosyn 12/27   Subjective   Much improved, agitated a little but more clearly repsonding this am     Objective    Interim History: none  Telemetry: Normal sinus rhythm 80s   Objective: Filed Vitals:   03/19/14 0051 03/19/14 0418 03/19/14 0605 03/19/14 1504  BP: 108/57  115/60 102/54  Pulse: 102  97 92  Temp:   98.8 F (37.1 C) 97.7 F (36.5 C)  TempSrc:   Oral Oral  Resp:   18 20  Height:      Weight:  55.5 kg (122 lb 5.7 oz)    SpO2:   100% 99%    Intake/Output Summary (Last 24 hours) at 03/19/14 1904 Last data filed at 03/19/14 1516  Gross per 24 hour  Intake      0 ml  Output   1000 ml  Net  -1000 ml    Exam:  General: alert to some  extent unclear how oriented he has. Facial droop noted on left side. XL  Exostosis of the left eye, Cardiovascular: S1-S2 no murmur rub or gallop Respiratory: clinically clear Abdomen: soft no rebound no guarding large scrotal hernia, GI tube in place [facility] lower extremity edema Neurointact  Data Reviewed: Basic Metabolic Panel:  Recent Labs Lab 03/15/14 1140 03/16/14 0514 03/18/14 0646 03/18/14 1734  NA 131* 133* 135 135  K 4.3 3.5 4.1 3.7  CL 95* 100 97 100  CO2 30 26 30 28   GLUCOSE 192* 134* 264* 343*  BUN 12 9 8 9   CREATININE 0.81 0.74 0.77 0.78  CALCIUM 8.6 8.0* 8.7 8.2*   Liver Function Tests:  Recent Labs Lab 03/18/14 1734  AST 17  ALT 9  ALKPHOS 137*  BILITOT 0.4  PROT 6.3  ALBUMIN 2.3*   No results for input(s): LIPASE, AMYLASE in the last 168 hours. No results for input(s): AMMONIA in the last 168 hours. CBC:  Recent Labs Lab 03/15/14 1140 03/18/14 0646 03/18/14 1734  WBC 14.9* 19.6* 27.8*  NEUTROABS 13.7* 17.1* 25.3*  HGB 12.1* 12.8* 10.9*  HCT 37.0* 39.8 33.8*  MCV 71.7* 71.7* 71.6*  PLT 461* 477* 421*   Cardiac Enzymes: No results for input(s): CKTOTAL, CKMB, CKMBINDEX, TROPONINI in the last 168 hours. BNP: Invalid input(s): POCBNP CBG:  Recent Labs Lab 03/19/14 0035 03/19/14 0344 03/19/14 0921 03/19/14 1120 03/19/14 1641  GLUCAP 100* 213*  110* 160* 208*    Recent Results (from the past 240 hour(s))  Blood Culture (routine x 2)     Status: None (Preliminary result)   Collection Time: 03/15/14 12:42 PM  Result Value Ref Range Status   Specimen Description BLOOD LEFT HAND  Final   Special Requests BOTTLES DRAWN AEROBIC AND ANAEROBIC 8CC BOTTLES  Final   Culture NO GROWTH 4 DAYS  Final   Report Status PENDING  Incomplete  Blood Culture (routine x 2)     Status: None (Preliminary result)   Collection Time: 03/15/14 12:42 PM  Result Value Ref Range Status   Specimen Description BLOOD RIGHT HAND DRAWN BY RN  Final   Special  Requests BOTTLES DRAWN AEROBIC AND ANAEROBIC 6CC BOTTLES  Final   Culture NO GROWTH 4 DAYS  Final   Report Status PENDING  Incomplete  Urine culture     Status: None   Collection Time: 03/15/14  1:08 PM  Result Value Ref Range Status   Specimen Description URINE, CATHETERIZED  Final   Special Requests NONE  Final   Colony Count   Final    25,000 COLONIES/ML Performed at [facility]    Culture   Final    VANCOMYCIN RESISTANT ENTEROCOCCUS ISOLATED Note: CRITICAL RESULT CALLED TO, READ BACK BY AND VERIFIED WITH: BREE JOHNSON AT 10:31 P.M. ON 03/18/2014 WARRB Performed at [facility]    Report Status 03/18/2014 FINAL  Final   Organism ID, Bacteria VANCOMYCIN RESISTANT ENTEROCOCCUS ISOLATED  Final      Susceptibility   Vancomycin resistant enterococcus isolated - MIC*    AMPICILLIN RESISTANT      LEVOFLOXACIN >=8 RESISTANT Resistant     NITROFURANTOIN <=16 SENSITIVE Sensitive     VANCOMYCIN 256 RESISTANT Resistant     TETRACYCLINE >=16 RESISTANT Resistant     LINEZOLID 1 SENSITIVE Sensitive     * VANCOMYCIN RESISTANT ENTEROCOCCUS ISOLATED  MRSA PCR Screening     Status: None   Collection Time: 03/15/14  5:19 PM  Result Value Ref Range Status   MRSA by PCR NEGATIVE NEGATIVE Final    Comment:        The GeneXpert MRSA Assay (FDA approved for NASAL specimens only), is one component of a comprehensive MRSA colonization surveillance program. It is not intended to diagnose MRSA infection nor to guide or monitor treatment for MRSA infections.      Studies:              All Imaging reviewed and is as per above notation   Scheduled Meds: . antiseptic oral rinse  7 mL Mouth Rinse BID  . clindamycin  300 mg Oral 3 times per day  . feeding supplement (JEVITY 1.2 CAL)  474 mL Per Tube TID  . heparin  5,000 Units Subcutaneous 3 times per day  . insulin aspart  0-9 Units Subcutaneous 6 times per day  . insulin detemir  5 Units Subcutaneous QHS  . linezolid  600 mg  Oral Q12H  . nicotine  14 mg Transdermal Daily   Continuous Infusions:     Assessment/Plan:  1. Severe sepsis on admission with hypotension, hypothermia ,  SIRS from malignancy vs etiology aspiration transitioned patient-From IV antibiotics to by mouth clindamycin via tube 300 every 8 hourly . He will need 10-14 days of this  2. VRE pyelonephritis-Continue Zyvox.  Will discuss with ID in am further management, dosingProfound hypoglycemia on admission secondary to poor by mouth intake necessitating bear hugger-now resolved  3. Acute respiratory 2/2  aspiration pneumonia.  noncompliant with feeding tube feeds. Nutritionist input appreciated  Weaned to room air 12/29 and not hypoxic. Chest x-ray as below. 4.  hyponatremia - probably secondary to  Very poor by mouth intake. Improving slightly--currently 135, on admission 129 5. Potential aspiration pneumonia versus metastatic lung disease from colon/ neck primary-2 view chest x-ray 12/28 confirms nodule which leads me to think this is more oncological.  Speech therapy evaluation ordered today 12/29 nonemergent-I've made him nothing by mouth as he is overtly having secretions and coughs and I do not think he is safe to eat, transition to clindamycin via PEG tube once initiated 6. 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 Family is not interested in  DNR and express the wish for him to be full code status.  I believe this case represents futile care and we will reassess dependant on progress   Code Status: currently full code Family Communication: none at bedside.   Disposition Plan: inpatient for now   Verneita Griffes, MD  [facility] Pager [phone] 03/19/2014, 7:04 PM    LOS: 4 days

## 2014-03-19 NOTE — Progress Notes (Signed)
Events noted. Plans are for him to be discharged today with outpatient hospice consultation. I think he is most appropriate for hospice care. I will plan to sign off.  Thanks for allowing me to see him with you

## 2014-03-19 NOTE — Progress Notes (Addendum)
Patient has had high residual during the night.  Held patient's night time bolus feed at 2100 and attempted to give in small doses during night.  Gave patient 60cc of bolus feed at 2200 and rechecked an hour later and patient still had >60 plus residual in peg tube. Patient had no complaints of nausea, vomiting, or complaints of abdominal pain.   Will continue to monitor patient.  Sitter at bedside.

## 2014-03-20 LAB — GLUCOSE, CAPILLARY
GLUCOSE-CAPILLARY: 84 mg/dL (ref 70–99)
Glucose-Capillary: 133 mg/dL — ABNORMAL HIGH (ref 70–99)
Glucose-Capillary: 184 mg/dL — ABNORMAL HIGH (ref 70–99)
Glucose-Capillary: 224 mg/dL — ABNORMAL HIGH (ref 70–99)

## 2014-03-20 LAB — BASIC METABOLIC PANEL
ANION GAP: 7 (ref 5–15)
BUN: 9 mg/dL (ref 6–23)
CHLORIDE: 102 meq/L (ref 96–112)
CO2: 29 mmol/L (ref 19–32)
Calcium: 8.7 mg/dL (ref 8.4–10.5)
Creatinine, Ser: 0.53 mg/dL (ref 0.50–1.35)
GFR calc Af Amer: >90 mL/min (ref >90)
GFR calc non Af Amer: >90 mL/min (ref >90)
Glucose, Bld: 95 mg/dL (ref 70–99)
Potassium: 3.1 mmol/L — ABNORMAL LOW (ref 3.5–5.1)
SODIUM: 138 mmol/L (ref 135–145)

## 2014-03-20 MED ORDER — LINEZOLID 600 MG PO TABS: 600.0000 mg | ORAL_TABLET | Freq: Two times a day (BID) | ORAL | Status: AC

## 2014-03-20 MED ORDER — CLINDAMYCIN PALMITATE HCL 75 MG/5ML PO SOLR: 300.0000 mg | Freq: Three times a day (TID) | ORAL | Status: AC

## 2014-03-20 MED ORDER — POTASSIUM CHLORIDE ER 20 MEQ PO TBCR: 10.0000 meq | EXTENDED_RELEASE_TABLET | Freq: Two times a day (BID) | ORAL | Status: AC

## 2014-03-20 MED ORDER — JEVITY 1.2 CAL PO LIQD: 474.0000 mL | Freq: Three times a day (TID) | ORAL | Status: AC

## 2014-03-20 MED ORDER — NICOTINE 14 MG/24HR TD PT24: 14.0000 mg | MEDICATED_PATCH | Freq: Every day | TRANSDERMAL | Status: AC

## 2014-03-20 NOTE — Progress Notes (Signed)
Report given to [facility] at [place] who verbalized understanding.

## 2014-03-20 NOTE — Clinical Social Work Placement (Signed)
Clinical Social Work Department CLINICAL SOCIAL WORK PLACEMENT NOTE 03/20/2014  Patient:  [name], [name]  Account Number:  [id number] Admit date:  03/15/2014  Clinical Social Worker:  [name], LCSW  Date/time:  03/20/2014 01:19 PM  Clinical Social Work is seeking post-discharge placement for this patient at the following level of care:   [place]   (*CSW will update this form in Epic as items are completed)   03/20/2014  Patient/family provided with [place] Department of Clinical Social Work's list of facilities offering this level of care within the geographic area requested by the patient (or if unable, by the patient's family).  03/20/2014  Patient/family informed of their freedom to choose among providers that offer the needed level of care, that participate in Medicare, Medicaid or managed care program needed by the patient, have an available bed and are willing to accept the patient.  03/20/2014  Patient/family informed of [facility]' ownership interest in [facility], as well as of the fact that they are under no obligation to receive care at this facility.  PASARR submitted to EDS on  PASARR number received on   FL2 transmitted to all facilities in geographic area requested by pt/family on  03/20/2014 FL2 transmitted to all facilities within larger geographic area on   Patient informed that his/her managed care company has contracts with or will negotiate with  certain facilities, including the following:     Patient/family informed of bed offers received:  03/20/2014 Patient chooses bed at [place] Physician recommends and patient chooses bed at  [place]  Patient to be transferred to [place] on  03/20/2014 Patient to be transferred to facility by family Patient and family notified of transfer on 03/20/2014 Name of family member notified:  Barbaraann Share- sister  The following physician request were entered in  Epic:   Additional Comments: Pt has existing pasarr.  [name], [place]

## 2014-03-20 NOTE — Clinical Social Work Psychosocial (Signed)
Clinical Social Work Department BRIEF PSYCHOSOCIAL ASSESSMENT 03/20/2014  Patient:  [name], [name]     Account Number:  [id number]     Admit date:  03/15/2014  Clinical Social Worker:  Wyatt Haste  Date/Time:  03/20/2014 03:24 PM  Referred by:  Care Management  Date Referred:  03/20/2014 Referred for  SNF Placement   Other Referral:   Interview type:  Patient Other interview type:    PSYCHOSOCIAL DATA Living Status:  FAMILY Admitted from facility:   Level of care:   Primary support name:  Louise Primary support relationship to patient:  SIBLING Degree of support available:   adequate    CURRENT CONCERNS Current Concerns  Post-Acute Placement   Other Concerns:    SOCIAL WORK ASSESSMENT / PLAN CSW met with pt at bedside. Pt alert and oriented, but speech is difficult to understand. He is known to CSW from previous admission. Pt lives at home with his sister. He recently d/c home and then returned to hospital again several days later. Pt has PEG tube which is fairly new. PT and OT evaluated pt today and recommendation is for SNF. CSW discussed placement process and Medicare coverage/criteria. He states he feels he would be "better off" at a facility now. SNF list provided. Pt said that he would like to stay in [place]. Referral faxed and bed offer at [facility], which pt accepts. CSW notified his sister, Barbaraann Share who is also agreeable. Pt d/c today. Family plan to pick up pt this afternoon. D/C summary faxed.   Assessment/plan status:  No Further Intervention Required Other assessment/ plan:   Information/referral to community resources:   SNF list    PATIENT'S/FAMILY'S RESPONSE TO PLAN OF CARE: Pt very open to SNF now, although he had refused in the past. D/C today to [facility].       [name], [place]

## 2014-03-20 NOTE — Clinical Social Work Placement (Signed)
Clinical Social Work Department CLINICAL SOCIAL WORK PLACEMENT NOTE 03/20/2014  Patient:  [name], [name]  Account Number:  [id number] Admit date:  03/15/2014  Clinical Social Worker:  [name], LCSW  Date/time:  03/20/2014 01:19 PM  Clinical Social Work is seeking post-discharge placement for this patient at the following level of care:   [place]   (*CSW will update this form in Epic as items are completed)   03/20/2014  Patient/family provided with [place] Department of Clinical Social Work's list of facilities offering this level of care within the geographic area requested by the patient (or if unable, by the patient's family).  03/20/2014  Patient/family informed of their freedom to choose among providers that offer the needed level of care, that participate in Medicare, Medicaid or managed care program needed by the patient, have an available bed and are willing to accept the patient.  03/20/2014  Patient/family informed of [facility]' ownership interest in [facility], as well as of the fact that they are under no obligation to receive care at this facility.  PASARR submitted to EDS on  PASARR number received on   FL2 transmitted to all facilities in geographic area requested by pt/family on  03/20/2014 FL2 transmitted to all facilities within larger geographic area on   Patient informed that his/her managed care company has contracts with or will negotiate with  certain facilities, including the following:     Patient/family informed of bed offers received:   Patient chooses bed at  Physician recommends and patient chooses bed at    Patient to be transferred to  on   Patient to be transferred to facility by  Patient and family notified of transfer on  Name of family member notified:    The following physician request were entered in Epic:   Additional Comments: Pt has existing pasarr.  [name], [place]

## 2014-03-20 NOTE — Evaluation (Signed)
Physical Therapy Evaluation Patient Details Name: BECKHEM ISADORE MRN: [id number] DOB: 1950-07-22 Today's Date: 03/20/2014   History of Present Illness  History is somewhat sparse as patient is a difficult historian secondary to lack of teeth however I did speak to his sister Mrs. Wimer at phone [phone]. Patient lives with her as well as another brother. Apparently this morning patient was extremely sleepy and somnolent and when his brother who is his roommate went to church his sister tried to wake him up and he was unarousable. His blood glucose per her check was 46 and she gave him orange juice and immediately call 911. She tells me that he he has never received his tube feeds for his PEG tube and he is not compliant on a dysphagia 1 diet and has been eating regular foods.  Clinical Impression  Pt has had constant loose stools today therefore therapist was unable to get pt up.  Pt most likely need skilled level of care as he was recently sent home from the hospital and readmitted.  Determination of destination ad D/C will be made at next session after therapist is able to see how pt is able to transfer and ambulate.     Follow Up Recommendations Home health PT;SNF       Recommendations for Other Services   OT    Precautions / Restrictions Precautions Precautions: Fall Restrictions Weight Bearing Restrictions: No      Mobility  Bed Mobility Overal bed mobility: Needs Assistance Bed Mobility: Rolling Rolling: Independent   Supine to sit: Modified independent (Device/Increase time)     General bed mobility comments: Pt needed to cleaned due to loose stools; pt sitter states that she has cleaned him multiple times today therefore pt was not gotten up                           Pertinent Vitals/Pain Faces Pain Scale: No hurt    Home Living Family/patient expects to be discharged to:: Skilled nursing facility Living Arrangements: Other relatives Available Help at  Discharge: Family;Available PRN/intermittently Type of Home: House Home Access: Stairs to enter Entrance Stairs-Rails: Can reach both Entrance Stairs-Number of Steps: 4 Home Layout: One level Home Equipment: Cane - single point;Walker - 2 wheels      Prior Function Level of Independence: Independent with assistive device(s)               Hand Dominance   Dominant Hand: Right    Extremity/Trunk Assessment               Lower Extremity Assessment: Overall WFL for tasks assessed         Communication   Communication: Expressive difficulties  Cognition Arousal/Alertness: Lethargic Behavior During Therapy: Flat affect Overall Cognitive Status: Within Functional Limits for tasks assessed                         Exercises General Exercises - Lower Extremity Ankle Circles/Pumps: Both;10 reps Long Arc Quad: Both;10 reps Hip ABduction/ADduction: Both;10 reps Straight Leg Raises: Both;10 reps      Assessment/Plan    PT Assessment Patient needs continued PT services  PT Diagnosis Difficulty walking;Generalized weakness   PT Problem List Decreased strength;Decreased activity tolerance  PT Treatment Interventions Gait training;Therapeutic exercise   PT Goals (Current goals can be found in the Care Plan section)      Frequency Min 3X/week  End of Session   Activity Tolerance: Treatment limited secondary to medical complications (Comment) (loose stools) Patient left: in bed;with call bell/phone within reach;with bed alarm set           Time: 1204-1229 PT Time Calculation (min) (ACUTE ONLY): 25 min   Charges:   PT Evaluation $Initial PT Evaluation Tier I: 1 Procedure     PT G Codes:        Alicja Everitt,CINDY 11-Apr-2014, 12:29 PM

## 2014-03-20 NOTE — Evaluation (Signed)
Occupational Therapy Evaluation Patient Details Name: [name] MRN: [id number] DOB: August 24, 1950 Today's Date: 03/20/2014    History of Present Illness [name] was referred to occupational therapy for evaluation and treatment for a splint.  OT evaluated patient this date.  Patient very drowzy throughout session.   Clinical Impression   [name] demonstrated decreased activity tolerance with ADL completion this date.  Patient referred to OT for splint.  However BUE and hands functioning and patient not in pain.  No splint issued this date.      Follow Up Recommendations  SNF    Equipment Recommendations       Recommendations for Other Services       Precautions / Restrictions Precautions Precautions: Fall Restrictions Weight Bearing Restrictions: No      Mobility Bed Mobility Overal bed mobility: Needs Assistance Bed Mobility: Rolling Rolling: Independent   Supine to sit: Modified independent (Device/Increase time)     General bed mobility comments: Pt needed to cleaned due to loose stools; pt sitter states that she has cleaned him multiple times today therefore pt was not gotten up   Transfers                      Balance                                            ADL                                         General ADL Comments: Patient demonstrated independence after setup with washing face and hair and hands this date.  Patient too drowzy to sit edge of bed to attempt dressing evaluation     Vision                     Perception     Praxis      Pertinent Vitals/Pain Pain Assessment: No/denies pain Faces Pain Scale: No hurt     Hand Dominance Right   Extremity/Trunk Assessment Upper Extremity Assessment Upper Extremity Assessment: Overall WFL for tasks assessed   Lower Extremity Assessment Lower Extremity Assessment: Defer to PT evaluation   Cervical / Trunk Assessment Cervical /  Trunk Assessment: Kyphotic   Communication Communication Communication: Expressive difficulties   Cognition Arousal/Alertness: Lethargic Behavior During Therapy: Flat affect Overall Cognitive Status: Within Functional Limits for tasks assessed                     General Comments       Exercises       Shoulder Instructions      Home Living Family/patient expects to be discharged to:: Skilled nursing facility Living Arrangements: Other relatives Available Help at Discharge: Family;Available PRN/intermittently Type of Home: House Home Access: Stairs to enter [employer] of Steps: 4 Entrance Stairs-Rails: Can reach both Home Layout: One level               Home Equipment: Cane - single point;Walker - 2 wheels   Additional Comments: No bathroom in home, pt reports he uses the bathroom in the woods (per sister the bathroom is detached).       Prior Functioning/Environment Level of Independence: Independent  OT Diagnosis: Generalized weakness   OT Problem List: Decreased activity tolerance   OT Treatment/Interventions: Self-care/ADL training;Therapeutic exercise;Therapeutic activities;Patient/family education    OT Goals(Current goals can be found in the care plan section) Acute Rehab OT Goals Patient Stated Goal: i want to go back to sleep OT Goal Formulation: With patient Time For Goal Achievement: 03/27/14 Potential to Achieve Goals: Fair  OT Frequency: Min 2X/week   Barriers to D/C:            Co-evaluation              End of Session    Activity Tolerance: Patient limited by fatigue Patient left: in bed;with call bell/phone within reach;with bed alarm set;with nursing/sitter in room   Time: 1245-1305 OT Time Calculation (min): 20 min Charges:  OT General Charges $OT Visit: 1 Procedure OT Evaluation $Initial OT Evaluation Tier I: 1 Procedure G-Codes:    Vangie Bicker, OTR/L [phone]  03/20/2014,  1:06 PM

## 2014-03-20 NOTE — Progress Notes (Signed)
Patient's IV removed and was clean, dry, and intact at removal.  Patient transferring to [facility] by family members.  Patient transported to vehicle via wheelchair by nurse tech.

## 2014-03-20 NOTE — Discharge Summary (Signed)
Physician Discharge Summary  JHOSTIN EPPS GDY:[id number] DOB: 08-12-1950 DOA: 03/15/2014  PCP: Reynolds Bowl, MD  Admit date: 03/15/2014 Discharge date: 03/20/2014  Time spent:  25 minutes  Recommendations for Outpatient Follow-up:  1.  recommend goals of care to continue as a discussion as an outpatient at nursing facility- PATIENT  REPRESENTS HIGH risk DECOMPENSATION FAMILY /  PATIENT  WISHES FULL code status 2.   Being discharged to [facility] nursing facility 3.  Recommend CBC/ he met in 1 week 4.  Continue tube feeds and check for residuals patient should he nothing by mouth absolutely 5.  complete course of clindamycin 03/29/14 for aspiration pneumonia 6.  Complete course of Zyvox 03/22/13 for VRE pyelonephritis 7.  continue only Lantus 5 units daily at bedtime - would not use either Amaryl or sliding scale insulin unless fasting a.m. Sugars are above 200  Discharge Diagnoses:  Principal Problem:   Acute respiratory failure Active Problems:   Aspiration pneumonia   Hypokalemia   Tobacco abuse   Cirrhosis   Neck mass   Metastatic disease   Absolute anemia   Cholelithiasis   Esophageal varices   Hydronephrosis   HCAP (healthcare-associated pneumonia)   Discharge Condition:  guarded  Diet recommendation:  PEG feeds , nothing by mouth otherwise, mouth care  [facility] Weights   03/18/14 0507 03/19/14 0418 03/20/14 0537  Weight: 49.624 kg (109 lb 6.4 oz) 55.5 kg (122 lb 5.7 oz) 54.341 kg (119 lb 12.8 oz)    History of present illness:  Brief narrative: 64 y/o ? Recent admission 12/14-02/2214 for aspiration pneumonia in the setting of choking. H/o failure to thrive, seizure disorder, hypertension, diabetes mellitus uncontrolled, hyperlipidemia, continued tobacco abuse, liver cirrhosis, malnutrition Prior hospital stay CT of the chest showed pulmonary nodules and CT neck revealed advanced latency of the neck-cervical lymph node biopsy 12/16 = squamous cell CA EGD 12/17 = none  bleeding dysphagia varices grade 2 Colonoscopy 12/21 revealed near obstructing cecal mass and she'll subsequently placed for nutritional support Admitted to step down unit 12/27 with profound hypoglycemia in the 30s Presented to the emergency room and desatted to the 60 percentile and needed to be aggressively managed with BiPAP also found to be hypothermic at 35 and placed on Bair hugger Initial labs showed lactic acidosis 2.1, pro calcitonin 5 PaO2 44 on 100% oxygen FiO2 and pH 7.4 Portal chest x-ray = right airspace disease versus consolidation  Hospital Course:   1. Severe sepsis on admission with hypotension, hypothermia , SIRS from malignancy vs etiology aspiration transitioned patient-From IV antibiotics to by mouth clindamycin via tube 300 every 8 hourly .  Date of completion 03/29/13 2. VRE pyelonephritis-Continue Zyvox until 03/22/13. Will need contact precautions at facility 3. Profound hypoglycemia on admission secondary to poor by mouth intake necessitating bear hugger-now resolved 4. Acute respiratory 2/2 aspiration pneumonia. noncompliant with feeding tube feeds. Nutritionist input appreciated Weaned to room air 12/29 and not hypoxic. Chest x-ray as below. 5. hyponatremia - probably secondary to Very poor by mouth intake. Improving slightly-- basic metabolic panel showed sodium =138, on admission 129 6. Potential aspiration pneumonia versus metastatic lung disease from colon/ neck primary-2 view chest x-ray 12/28 confirms nodule which leads me to think this is more oncological. Speech therapy evaluation ordered today 12/29 nonemergent-I've made him nothing by mouth as he is overtly having secretions and coughs and I do not think he is safe to eat, transition to clindamycin via PEG tube once initiated 7. Cirrhosis-probably secondary to metastatic  colon cancer 8.  history diabetes mellitus - discontinue Amaryl, continue insulin at prior to admission doses of Lantus 5 units daily at  bedtime- 9. 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  hypokalemia- she will be given by mouth potassium packets on day of discharge 03/20/14 40 mEq daily for 3 days and with lab should be done  Discharge Exam: Filed Vitals:   03/20/14 1302  BP: 142/76  Pulse: 91  Temp: 98 F (36.7 C)  Resp: 20    alert and more oriented  tolerating feeds General:   EOMI NCAT , facial symmetry Cardiovascular:  Assessment S2 no murmur rub or gallop Respiratory:   Clinically clear  Abdomen soft nontender nondistended  PEG tube is working  Discharge Instructions   Discharge Instructions    Diet - low sodium heart healthy    Complete by:  As directed      Increase activity slowly    Complete by:  As directed           Current Discharge Medication List    START taking these medications   Details  clindamycin (CLEOCIN) 75 MG/5ML solution Take 20 mLs (300 mg total) by mouth every 8 (eight) hours. Qty: 100 mL, Refills: 0    linezolid (ZYVOX) 600 MG tablet Take 1 tablet (600 mg total) by mouth every 12 (twelve) hours. Qty: 2 tablet, Refills: 0    nicotine (NICODERM CQ - DOSED IN MG/24 HOURS) 14 mg/24hr patch Place 1 patch (14 mg total) onto the skin daily. Qty: 28 patch, Refills: 0    Nutritional Supplements (FEEDING SUPPLEMENT, JEVITY 1.2 CAL,) LIQD Place 474 mLs into feeding tube 3 (three) times daily. Qty: 1500 mL, Refills: 0    potassium chloride 20 MEQ TBCR Take 10 mEq by mouth 2 (two) times daily. Qty: 10 tablet, Refills: 0      CONTINUE these medications which have NOT CHANGED   Details  atorvastatin (LIPITOR) 10 MG tablet Take 10 mg by mouth every evening.     insulin aspart (NOVOLOG) 100 UNIT/ML injection Inject 16 Units into the skin 3 (three) times daily before meals.    insulin detemir (LEVEMIR) 100 UNIT/ML injection Inject 5 Units into the skin at bedtime.    !! levalbuterol (XOPENEX) 0.63 MG/3ML nebulizer solution Take 3 mLs (0.63 mg total) by nebulization  3 (three) times daily. Qty: 270 mL, Refills: 1    lisinopril (PRINIVIL,ZESTRIL) 5 MG tablet Take 5 mg by mouth daily.    pantoprazole (PROTONIX) 40 MG tablet Take 1 tablet (40 mg total) by mouth 2 (two) times daily before a meal. Qty: 60 tablet, Refills: 1    ferrous sulfate 325 (65 FE) MG tablet Take 2 tablets (650 mg total) by mouth 2 (two) times daily with a meal.    folic acid (FOLVITE) 1 MG tablet Take 1 tablet (1 mg total) by mouth daily.    !! levalbuterol (XOPENEX) 0.63 MG/3ML nebulizer solution Take 3 mLs (0.63 mg total) by nebulization every 6 (six) hours as needed for wheezing or shortness of breath. Qty: 90 mL, Refills: 1    nicotine polacrilex (NICORETTE) 2 MG gum Take 1 each (2 mg total) by mouth as needed for smoking cessation. Qty: 100 tablet, Refills: 1     !! - Potential duplicate medications found. Please discuss with provider.    STOP taking these medications     glipiZIDE (GLUCOTROL) 10 MG tablet      clindamycin (CLEOCIN) 300 MG capsule  polyethylene glycol (MIRALAX / GLYCOLAX) packet        No Known Allergies    The results of significant diagnostics from this hospitalization (including imaging, microbiology, ancillary and laboratory) are listed below for reference.    Significant Diagnostic Studies: Dg Chest 2 View  03/02/2014   CLINICAL DATA:  Choking on food denied  EXAM: CHEST  2 VIEW  COMPARISON:  02/28/2014  FINDINGS: Right hemidiaphragm remains elevated. Nodular density now projects over the inferior right hilum. Hazy airspace disease at the right base. Left lung is clear. Chronic right-sided rib deformities.  IMPRESSION: Hazy right lower lobe airspace disease.  New nodular density at the right lung base is indeterminate. Followup studies to ensure resolution are recommended. If the abnormality fails to resolve, CT is recommended.   Electronically Signed   By: Maryclare Bean M.D.   On: 03/02/2014 20:03   Dg Chest 2 View  02/28/2014   CLINICAL  DATA:  Cough and congestion for several days.  EXAM: CHEST  2 VIEW  COMPARISON:  05/23/2010  FINDINGS: Mild lung base opacity is noted, greater on the right. This may all be atelectasis. Pneumonia is possible but felt less likely. Elevation the right hemidiaphragm is stable.  No evidence of pulmonary edema. No pleural effusion or pneumothorax.  Cardiac silhouette is normal in size. Normal mediastinal and hilar contours.  Bony thorax is demineralized but intact.  IMPRESSION: 1. Lung base opacity, right greater than left, likely atelectasis. Right lower lobe pneumonia is possible, but felt less likely. 2. Chronic mild elevation the right hemidiaphragm. No pulmonary edema.   Electronically Signed   By: Lajean Manes M.D.   On: 02/28/2014 14:03   Ct Soft Tissue Neck W Contrast  03/03/2014   CLINICAL DATA:  64 year old male with cough, shortness of breath, abnormal neck fullness. Dysphagia for 4-5 months.  EXAM: CT NECK WITH CONTRAST  TECHNIQUE: Multidetector CT imaging of the neck was performed using the standard protocol following the bolus administration of intravenous contrast.  CONTRAST:  61mL OMNIPAQUE IOHEXOL 300 MG/ML SOLN in conjunction with contrast enhanced imaging of the chest reported separately.  COMPARISON:  Noncontrast CTs of the head 02/15/2014 and earlier.  FINDINGS: Chest findings today reported separately.  Study is intermittently degraded by motion artifact despite repeated imaging attempts.  Bulky and confluent cystic/necrotic soft tissue mass is in the right neck. A conglomeration at the right level 2 nodal station measures up to 45 mm largest dimension. More solid enhancing nodal/ex nodal disease tracking to the right level 4 station. At the level of the lateral wall of the oropharynx there is asymmetric but indistinct soft tissue thickening of the pharynx up to 9 mm (series 8, image 43). Some of the abnormal cystic/necrotic soft tissue mass extends to abut the enter surface of the ramus of  the right mandible (series 8, image 44). And there is motion artifact in the region of the mandible. There are prior ORIF changes to the right mandible ramus. Dentition is absent throughout. There is sclerosis of the posterior body of the right mandible, but no bony destruction or erosion identified.  There are smaller cystic/necrotic masses in the left head and neck including at the left level 1B nodal station (series 8, image 54). There is conglomerate involvement at the left Level 3 station. The left level 2 station is spared. There is a mildly enlarged solid-appearing node at the level 1 A station, 10 mm short axis. The largest of these masses measure  up to 3.9 cm largest dimension.  Negative thyroid. Larynx is within normal limits. Negative retropharyngeal space. Negative sublingual space. Negative submandibular and parotid glands except for regional mass effect and right submandibular ductal ectasia. Visualized orbit soft tissues are within normal limits. Grossly negative visible brain parenchyma.  Chronic inflammation of the left maxillary sinus, left middle ear and mastoid, with mucoperiosteal thickening in those regions. There may be a small accessory tooth in the anterior left maxillary sinus (series 17, image 28).  Both carotid and vertebral arteries in the neck remain patent. The right IJ is in dated and thrombosed. The bulky right neck disease is inseparable from the right carotid bifurcation, and tracks medially through the splayed right external and internal carotid arteries. The left IJ is compressed but remains patent.  No acute or suspicious osseous lesion identified. Degenerative changes in the cervical spine.  IMPRESSION: 1. 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 This invades and occludes the right internal jugular vein, and encases the right carotid bifurcation. 2. Left level 1 and  level 3 nodal station involvement as well. 3. Primary tumor site is uncertain, but might be the right tonsillar pillar. 4. Chest CT findings today reported separately.   Electronically Signed   By: Augusto Gamble M.D.   On: 03/03/2014 12:55   Ct Chest W Contrast  03/03/2014   CLINICAL DATA:  Cough, shortness of breath. Neck fullness. Dysphagia.  EXAM: CT CHEST WITH CONTRAST  TECHNIQUE: Multidetector CT imaging of the chest was performed during intravenous contrast administration.  CONTRAST:  65mL OMNIPAQUE IOHEXOL 300 MG/ML  SOLN  COMPARISON:  Chest CT 01/05/2010  FINDINGS: Neck findings reported separately.  Visualized thyroid is unremarkable. No axillary or mediastinal lymphadenopathy. Bilateral lower neck adenopathy, see dedicated neck CT report for description. Normal heart size. No pericardial effusion. Aorta and main pulmonary artery are normal in caliber.  Central airways are patent. There is a 1.9 x 1.7 cm nodule within the right lower lobe (image 34; series 3). There is an adjacent 0.6 cm right lower lobe nodule (image 34; series 3). There is a 0.5 cm right upper lobe nodule (image 21; series 3). There is a 3 mm right upper lobe nodule (image 15; series 3). Dependent ground-glass and consolidative opacity demonstrated within the right and left lower lobes. Additionally there is left lower lobe bronchial wall thickening. No pleural effusion or pneumothorax.  Visualization of the upper abdomen demonstrates a centrally necrotic enhancing mass within the hepatic dome measuring 2.8 x 2.9 cm. There is an additional 0.7 cm low-attenuation lesion within the hepatic dome (image 44; series 2). There is an additional 1.9 x 1.9 cm peripherally enhancing centrally necrotic lesion within the hepatic dome (image 40; series 2). Re- demonstrated hepatic parenchymal calcifications.  Mild heterogeneity along the inferior margin of right hepatic lobe. This is incompletely evaluated and focal lesion at this location is not  excluded. Small amount of perihepatic fluid. Small hiatal hernia. Diffuse esophageal wall thickening, most pronounced near the junction.  There is dilatation of the main pancreatic duct measuring up to 11 mm.  Multiple old posterior rib fractures. No aggressive or acute appearing osseous lesions. Degenerative changes of the lower thoracic and lumbar vertebral bodies. Gynecomastia.  IMPRESSION: 1. Multiple pulmonary nodules, concerning for metastatic disease. 2. Multiple centrally necrotic peripherally enhancing hepatic masses, concerning for metastatic disease. 3. Marked dilatation of the main pancreatic duct measuring  up to 11 mm. The causative etiology is not identified on current evaluation as the entire pancreas is not included. Recommend dedicated evaluation with contrast-enhanced CT or potentially MRI. 4. Bilateral lower cervical adenopathy, most compatible with metastatic disease. See dedicated neck CT report. 5. Bilateral lower lobe ground-glass opacities, favored to represent atelectasis. Additionally there is left lower lobe bronchial wall thickening, raising the possibility of associated aspiration. 6. Diffuse wall thickening of the esophagus, most pronounced near the GE junction. While findings may be secondary to esophagitis, underlying distal esophageal mass is not entirely excluded. Consider correlation with EGD.  These results will be called to the ordering clinician or representative by the Radiologist Assistant, and communication documented in the PACS or zVision Dashboard.   Electronically Signed   By: Lovey Newcomer M.D.   On: 03/03/2014 13:20   US Soft Tissue Head/neck  03/04/2014   CLINICAL DATA:  Neck mass, suspected cervical adenopathy by neck CT  EXAM: ULTRASOUND OF HEAD/NECK SOFT TISSUES  TECHNIQUE: Ultrasound examination of the head and neck soft tissues was performed in the area of clinical concern.  COMPARISON:  None  FINDINGS: In the RIGHT cervical region, 3 soft tissue masses are  identified likely representing enlarged lymph nodes.  More superior nodule is heterogeneous with areas of hyper echogenicity to hypoechoic question complex cystic change, 4.1 x 2.6 x 2.8 cm, containing internal blood flow on color Doppler imaging.  A more in inferior nodule measures 4.2 x 2.4 x 2.7 cm, is predominantly iso- to hypoechoic with mild heterogeneity, with more pronounced internal blood flow.  A smaller inferior RIGHT side nodule is identified measuring 1.5 x 1.1 x 1.0 cm, homogeneous relatively isoechoic.  In addition, an enlarged LEFT cervical node is identified, 2.3 x 3.6 x 1.8 cm, heterogeneous in echogenicity and containing internal blood flow.  The 3 largest nodules contain tiny echogenic foci which likely represent tiny calcifications.  IMPRESSION: Multiple soft tissue nodules within the neck as above most likely representing enlarged abnormal appearing cervical lymph nodes.   Electronically Signed   By: Lavonia Dana M.D.   On: 03/04/2014 14:19   US Guided Needle Placement  03/04/2014   CLINICAL DATA:  Cervical adenopathy  EXAM: ULTRASOUND GUIDED CORE NEEDLE BIOPSY OF A RIGHT CERVICAL NODE  COMPARISON:  Previous exams.  FINDINGS: Procedure, risks, benefits and alternatives discussed with the patient.  Patient's questions answered.  Written informed consent for core biopsy of the enlarged RIGHT cervical lymph nodes was obtained.  Time-out protocol followed.  Dominant nodule in the inferior RIGHT cervical region localized by ultrasound  Nodule measures 4.2 x 2.4 x 2.7 cm.  Skin prepped and draped in usual sterile fashion.  Skin and overlying soft tissues anesthetized with 3 mL of 2% lidocaine lidocaine.  Under direct sonographic visualization, three 14 gauge core biopsies of the targeted enlarged RIGHT cervical lymph node were performed.  Ultrasound visualization was utilized to confirm core sample acquisition within the targeted lymph node.  Obtained tissue cores appeared adequate and were placed  in the appropriate container.  Procedure tolerated well by patient.  Specimen sent to pathology for evaluation.  No evidence of hematoma on post procedural imaging.  IMPRESSION: Three ultrasound guided core biopsies of an enlarged RIGHT cervical lymph node as above.  No acute abnormalities.   Electronically Signed   By: Lavonia Dana M.D.   On: 03/04/2014 15:53   Ct Abdomen Pelvis W Contrast  03/05/2014   CLINICAL DATA:  Malignancy.  EXAM: CT ABDOMEN  AND PELVIS WITH CONTRAST  TECHNIQUE: Multidetector CT imaging of the abdomen and pelvis was performed using the standard protocol following bolus administration of intravenous contrast.  CONTRAST:  60mL OMNIPAQUE IOHEXOL 300 MG/ML SOLN, OMNIPAQUE IOHEXOL 300 MG/ML SOLN  COMPARISON:  CT scan of August 19, 2009.  FINDINGS: 23 x 16 mm irregular density is noted posteriorly in the right lower lobe concerning for metastatic disease. Smaller nodule is noted more anteriorly in the right lower lobe. Old right rib fractures are noted. No other significant osseous abnormality seen in the visualized portion of the abdomen or pelvis.  Hepatic cirrhosis is again noted with calcification present within right hepatic cleft. Mild cholelithiasis is noted. The spleen appears normal. Pancreatic head calcifications are noted suggesting chronic pancreatitis. There is interval development of severe pancreatic ductal dilatation suggesting obstruction. No pancreatic mass is noted. No biliary dilatation is noted. 2.1 cm ill-defined low density is noted in the medial portion the right hepatic lobe concerning for metastatic disease or malignancy. Adrenal glands appear normal. Nonobstructive calculus is noted in lower pole collecting system of left kidney. Bilateral hydronephrosis is noted without evidence of ureteral calculus. Mild urinary bladder distention is noted.  Large irregular mass measuring 7.1 x 4.9 cm is seen involving the cecum consistent with colonic malignancy. Multiple  enlarged satellite masses or lesions are noted concerning for metastatic disease in the right lower quadrant mesenteric region. The largest measures 3.3 x 2.6 cm in size. There is no evidence of bowel obstruction. Stool is noted in the rectum. No abnormal fluid collection is noted.  Atherosclerotic calcifications of abdominal aorta are noted without aneurysm formation.  Very large right inguinal hernia is noted which contains loops of small bowel, but does not result in incarceration or obstruction. This extends into the scrotum. Is significantly increased in size compared to prior exam.  IMPRESSION: Large right inguinal hernia is noted which is significantly increased in size compared to prior exam, which extends into the scrotum. And contains loops of small bowel, but does not result in incarceration or obstruction.  Hepatic cirrhosis is noted. 2.1 cm ill-defined low density is noted in the medial portion of the right hepatic lobe consistent with metastatic disease or malignancy.  Mild cholelithiasis.  2.3 cm irregular density seen posteriorly in the right lower lobe concerning for metastatic disease.  Large irregular mass is seen involving the cecum consistent with colonic malignancy. Multiple and large satellite lesions are masses are noted in the right lower quadrant mesenteric region consistent with metastatic disease.  Nonobstructive left nephrolithiasis is noted.  Bilateral hydronephrosis is noted without evidence of obstructing calculus. This is concerning for distal ureteral obstruction of unknown etiology, or potentially may be due to urinary bladder distention.  Pancreatic head calcifications are noted suggesting chronic pancreatitis. There is interval development of severe dilatation of the pancreatic duct without common bile duct dilatation. This is concerning for ductal obstruction.   Electronically Signed   By: Roque Lias M.D.   On: 03/05/2014 21:40   [place] Core Biopsy  03/04/2014   CLINICAL DATA:   Cervical adenopathy  EXAM: ULTRASOUND GUIDED CORE NEEDLE BIOPSY OF A RIGHT CERVICAL NODE  COMPARISON:  Previous exams.  FINDINGS: Procedure, risks, benefits and alternatives discussed with the patient.  Patient's questions answered.  Written informed consent for core biopsy of the enlarged RIGHT cervical lymph nodes was obtained.  Time-out protocol followed.  Dominant nodule in the inferior RIGHT cervical region localized by ultrasound  Nodule measures 4.2 x 2.4  x 2.7 cm.  Skin prepped and draped in usual sterile fashion.  Skin and overlying soft tissues anesthetized with 3 mL of 2% lidocaine lidocaine.  Under direct sonographic visualization, three 14 gauge core biopsies of the targeted enlarged RIGHT cervical lymph node were performed.  Ultrasound visualization was utilized to confirm core sample acquisition within the targeted lymph node.  Obtained tissue cores appeared adequate and were placed in the appropriate container.  Procedure tolerated well by patient.  Specimen sent to pathology for evaluation.  No evidence of hematoma on post procedural imaging.  IMPRESSION: Three ultrasound guided core biopsies of an enlarged RIGHT cervical lymph node as above.  No acute abnormalities.   Electronically Signed   By: Lavonia Dana M.D.   On: 03/04/2014 15:53   Dg Chest Port 1 View  03/16/2014   CLINICAL DATA:  Aspiration into the airway  EXAM: PORTABLE CHEST - 1 VIEW  COMPARISON:  03/15/2014, 05/23/2010  FINDINGS: There is elevation of the right diaphragm. There is right lower lobe pulmonary nodule. There is no focal parenchymal opacity, pleural effusion, or pneumothorax. The heart and mediastinal contours are unremarkable.  The osseous structures are unremarkable.  IMPRESSION: No active disease.  Right lower lobe pulmonary nodule better seen on recent CT chest 03/03/2014.   Electronically Signed   By: Kathreen Devoid   On: 03/16/2014 11:28   Dg Chest Portable 1 View  03/15/2014   CLINICAL DATA:  Unresponsive this  morning  EXAM: PORTABLE CHEST - 1 VIEW  COMPARISON:  03/02/2014  FINDINGS: Right hemidiaphragm remains elevated. Heterogeneous opacities have increased at the right lung base. Vascular congestion. No Kerley B-lines. Right infrahilar nodular density persists.  IMPRESSION: Increasing right lower lobe airspace disease.  Vascular congestion has developed.   Electronically Signed   By: Maryclare Bean M.D.   On: 03/15/2014 12:27   Dg Swallowing Func-speech Pathology  03/06/2014   Ephraim Hamburger, CCC-SLP     03/06/2014  1:12 PM Objective Swallowing Evaluation: Modified Barium Swallowing Study    Patient Details  Name: KAEGAN HETTICH MRN: [id number] Date of Birth: 1950-09-25  Today's Date: 03/06/2014 Time: 1130-1200 SLP Time Calculation (min) (ACUTE ONLY): 30 min  Past Medical History:  Past Medical History  Diagnosis Date  . Diabetes mellitus   . Liver disease   . Enlarged prostate   . Hernia   . Alcohol dependence   . Seizures   . Hypercholesterolemia   . Liver lesion   . Pulmonary nodules   . Cervical mass     biopsy 03/04/14  . Metastatic disease   . Anemia   . Malnutrition   . Esophageal varices   . Hydronephrosis   . Cholelithiasis    Past Surgical History:  Past Surgical History  Procedure Laterality Date  . Appendectomy     HPI:  Mr. 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  Multiple imaging studies (CT neck, CT chest, US abdomen)  performed since admission with metastatic disease and unknown  primary. CA 19-9 elevated at 95.8, AFP tumor marker normal.  Oncology consult appreciated. Neck node biopsy completed  yesterday, results pending. Also with esophageal wall thickening  on imaging and EGD tentatively planned for today. Also with plans  for CT A/P with contrast today. Anemia: multifactorial,  iron-deficiency noted. No overt signs of GI bleeding. Hemoccult  status  unknown but has been ordered. Continue to monitor.  Received 1 and 1/2 units of blood over night. SLP asked to  clinically evaluate swallow due to reports of solid food  dysphagia.     Assessment / Plan / Recommendation Clinical Impression  Dysphagia Diagnosis: Mild oral phase dysphagia;Moderate  pharyngeal phase dysphagia;Severe pharyngeal phase dysphagia Clinical impression: Mr Mangiaracina presents with mild oral phase  dysphagia in setting of edentulous status with impaired  mastication and weak lingual manipulation resulting in prolonged  oral transit and residue post swallow; mod/severe sensorimotor  and anatomically altered pharyngeal phase dysphagia characterized  by mild delay in swallow initiation, decreased tongue base  retraction, decreased hyolaryngeal excursion, decreased  pharyngeal pressure and decreased airway protection resulting in  significant residuals in pharynx post swallow in the valleculae,  pyriforms, and posterior pharyngeal wall. Residuals are more  pronounced with puree and semi solid textures, however liquids  are pooled in pyriforms which pt transiently aspirates (trace  amounts) post swallow. Penetration occurs during the swallow  (from residuals most likely) with liquids, however with cued  cough, pt is able to clear. Alternating semi solids and liquids  was only marginally effective in reducing pharyngeal residue. Pt  did not overtly cough during trace aspiration down posterior  tracheal wall, however he had wet vocal qualiity and some throat  clearing. Honey-thick liquids were not assessed as it was felt  that it would only increase residuals. Pt has osteophytes along  the cervical and thoracic spine with a more pronounced  impingement into pharyngeal space at C4-5 which is where most  solid residuals are delayed/pooled. While this is obviously not  an acute finding, Mr. Dieudonne has significant pharyngeal weakness  which makes it difficult to clear the residuals. The presence of   suspected mass was visualized and may also have deliterious  effects on pharyngeal swallow (particularly cricopharyngeal  function) also contributing to significant residuals. Pt is  judged to be at risk for all textures/consistencies, however a  "safest diet" can be recommended to avoid alternative sources of  nutrition. Recommend D1/puree and thin liquids; pt to cough after  each swallow and repeat swallow throughout meals. Recommendations  reviewed with pt and precautions posted. No family present.  Prognosis for improvement is poor given mass.    Treatment Recommendation  Therapy as outlined in treatment plan below    Diet Recommendation Dysphagia 1 (Puree);Thin liquid   Liquid Administration via: Cup Medication Administration: Crushed with puree Supervision: Patient able to self feed;Intermittent supervision  to cue for compensatory strategies Compensations: Slow rate;Multiple dry swallows after each  bite/sip;Follow solids with liquid;Hard cough after  swallow;Effortful swallow Postural Changes and/or Swallow Maneuvers: Seated upright 90  degrees;Upright 30-60 min after meal;Out of bed for meals    Other  Recommendations Recommended Consults: MBS Oral Care Recommendations: Oral care BID Other Recommendations: Clarify dietary restrictions   Follow Up Recommendations  Home health SLP    Frequency and Duration min 1 x/week  1 week   Pertinent Vitals/Pain VSS    SLP Swallow Goals   Pt will demonstrate safe and efficient consumption of least  restrictive diet with use of strategies as needed.    General Date of Onset: 03/02/14 HPI: Mr. 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  Multiple imaging studies (CT neck, CT chest, US abdomen)  performed since admission with metastatic disease and unknown  primary. CA 19-9 elevated at 95.8, AFP tumor marker normal.  Oncology  consult appreciated. Neck node biopsy completed  yesterday, results pending. Also with esophageal wall thickening  on imaging and EGD tentatively planned for today. Also with plans  for CT A/P with contrast today. Anemia: multifactorial,  iron-deficiency noted. No overt signs of GI bleeding. Hemoccult  status unknown but has been ordered. Continue to monitor.  Received 1 and 1/2 units of blood over night. SLP asked to  clinically evaluate swallow due to reports of solid food  dysphagia. Type of Study: Modified Barium Swallowing Study Reason for Referral: Objectively evaluate swallowing function Previous Swallow Assessment: 2010 Diet Prior to this Study: Thin liquids (full liquids) Temperature Spikes Noted: No Respiratory Status: Room air History of Recent Intubation: No Behavior/Cognition: Alert;Cooperative Oral Cavity - Dentition: Edentulous Oral Motor / Sensory Function: Impaired - see Bedside swallow  eval Self-Feeding Abilities: Able to feed self Patient Positioning: Upright in chair Baseline Vocal Quality: Wet Volitional Cough: Congested;Weak Volitional Swallow: Able to elicit Anatomy: Other (Comment) (osteophytes; mass-like opaque shape  noted at level of ~C6) Pharyngeal Secretions: Not observed secondary MBS    Reason for Referral Objectively evaluate swallowing function   Oral Phase Oral Preparation/Oral Phase Oral Phase: Impaired Oral - Solids Oral - Mechanical Soft: Impaired mastication;Weak lingual  manipulation;Reduced posterior propulsion;Delayed oral  transit;Lingual/palatal residue   Pharyngeal Phase Pharyngeal Phase Pharyngeal Phase: Impaired Pharyngeal - Nectar Pharyngeal - Nectar Cup: Premature spillage to pyriform  sinuses;Reduced pharyngeal peristalsis;Reduced epiglottic  inversion;Reduced anterior laryngeal mobility;Reduced laryngeal  elevation;Reduced airway/laryngeal closure;Reduced tongue base  retraction;Penetration/Aspiration after swallow;Trace  aspiration;Pharyngeal residue -  valleculae;Pharyngeal residue -  pyriform sinuses;Pharyngeal residue - cp segment;Lateral channel  residue Penetration/Aspiration details (nectar cup): Material enters  airway, passes BELOW cords without attempt by patient to eject  out (silent aspiration);Material enters airway, passes BELOW  cords then ejected out Pharyngeal - Thin Pharyngeal - Thin Cup: Premature spillage to pyriform  sinuses;Premature spillage to valleculae;Reduced pharyngeal  peristalsis;Reduced epiglottic inversion;Reduced anterior  laryngeal mobility;Reduced laryngeal elevation;Reduced  airway/laryngeal closure;Reduced tongue base  retraction;Penetration/Aspiration after swallow;Trace  aspiration;Pharyngeal residue - valleculae;Pharyngeal residue -  pyriform sinuses;Pharyngeal residue - cp segment;Inter-arytenoid  space residue;Lateral channel residue Penetration/Aspiration details (thin cup): Material enters  airway, passes BELOW cords without attempt by patient to eject  out (silent aspiration);Material enters airway, passes BELOW  cords then ejected out Pharyngeal - Solids Pharyngeal - Puree: Premature spillage to valleculae;Reduced  pharyngeal peristalsis;Reduced epiglottic inversion;Reduced  anterior laryngeal mobility;Reduced laryngeal elevation;Reduced  airway/laryngeal closure;Reduced tongue base  retraction;Pharyngeal residue - valleculae;Pharyngeal residue -  pyriform sinuses;Pharyngeal residue - posterior  pharnyx;Pharyngeal residue - cp segment Pharyngeal - Mechanical Soft: Premature spillage to  valleculae;Reduced anterior laryngeal mobility;Reduced epiglottic  inversion;Reduced pharyngeal peristalsis;Reduced laryngeal  elevation;Reduced airway/laryngeal closure;Reduced tongue base  retraction;Pharyngeal residue - valleculae;Pharyngeal residue -  pyriform sinuses;Pharyngeal residue - posterior  pharnyx;Pharyngeal residue - cp segment Pharyngeal - Pill: Not tested Pharyngeal Phase - Comment Pharyngeal Comment: Residuals more  pronounced with puree and semi  solids; cue to cough after swallow was effective in removing  liquids to cords  Cervical Esophageal Phase    GO    Cervical Esophageal Phase Cervical Esophageal Phase: Impaired Cervical Esophageal Phase - Solids Puree: Prominent cricopharyngeal segment        Thank you,  Genene Churn, CCC-SLP [phone]  PORTER,DABNEY 03/06/2014, 1:10 PM    US Abdomen Limited Ruq  03/03/2014   CLINICAL DATA:  Cirrhosis, diabetes,  hypercholesterolemia, abnormal CT exam in 2011 showing a questionable hepatic abnormality  EXAM: US ABDOMEN LIMITED - RIGHT UPPER QUADRANT  COMPARISON:  CT chest 01/05/2010, CT abdomen 08/19/2009  FINDINGS: Gallbladder:  Thickened gallbladder wall. Shadowing calculi and sludge within gallbladder. Gallbladder incompletely distended. No sonographic Murphy sign. Mild amount of pericholecystic fluid.  Common bile duct:  Diameter: Normal caliber 4 mm diameter  Liver:  Increased hepatic echogenicity with nodular margins consistent with cirrhosis. Hepatopetal portal venous flow. Question nodule within RIGHT lobe 3.0 x 1.9 x 2.6 cm. Linear calcification within RIGHT lobe on prior CT exam again identified.  Mild RIGHT renal collecting system dilatation noted.  Mildly heterogeneous appearance of the pancreas is identified, atrophic, poorly assessed due to overlying bowel.  IMPRESSION: Thickened gallbladder wall nonspecific in the setting of minimal ascites.  Gallstones and sludge within gallbladder without definite sonographic Murphy sign.  If there is persistent clinical concern for acute cholecystitis recommend hepatobiliary imaging.  Cirrhotic appearing liver with linear calcification in RIGHT lobe again identified.  Nonspecific RIGHT lobe liver lesion 3.0 x 1.9 x 2.6 cm, uncertain etiology, not definitely seen in prior exam; recommend followup MR assessment with and without contrast to characterize.   Electronically Signed   By: Lavonia Dana M.D.   On: 03/03/2014 14:35     Microbiology: Recent Results (from the past 240 hour(s))  Blood Culture (routine x 2)     Status: None (Preliminary result)   Collection Time: 03/15/14 12:42 PM  Result Value Ref Range Status   Specimen Description BLOOD LEFT HAND  Final   Special Requests BOTTLES DRAWN AEROBIC AND ANAEROBIC 8CC BOTTLES  Final   Culture NO GROWTH 4 DAYS  Final   Report Status PENDING  Incomplete  Blood Culture (routine x 2)     Status: None (Preliminary result)   Collection Time: 03/15/14 12:42 PM  Result Value Ref Range Status   Specimen Description BLOOD RIGHT HAND DRAWN BY RN  Final   Special Requests BOTTLES DRAWN AEROBIC AND ANAEROBIC 6CC BOTTLES  Final   Culture NO GROWTH 4 DAYS  Final   Report Status PENDING  Incomplete  Urine culture     Status: None   Collection Time: 03/15/14  1:08 PM  Result Value Ref Range Status   Specimen Description URINE, CATHETERIZED  Final   Special Requests NONE  Final   Colony Count   Final    25,000 COLONIES/ML Performed at [facility]    Culture   Final    VANCOMYCIN RESISTANT ENTEROCOCCUS ISOLATED Note: CRITICAL RESULT CALLED TO, READ BACK BY AND VERIFIED WITH: BREE JOHNSON AT 10:31 P.M. ON 03/18/2014 WARRB Performed at [facility]    Report Status 03/18/2014 FINAL  Final   Organism ID, Bacteria VANCOMYCIN RESISTANT ENTEROCOCCUS ISOLATED  Final      Susceptibility   Vancomycin resistant enterococcus isolated - MIC*    AMPICILLIN RESISTANT      LEVOFLOXACIN >=8 RESISTANT Resistant     NITROFURANTOIN <=16 SENSITIVE Sensitive     VANCOMYCIN 256 RESISTANT Resistant     TETRACYCLINE >=16 RESISTANT Resistant     LINEZOLID 1 SENSITIVE Sensitive     * VANCOMYCIN RESISTANT ENTEROCOCCUS ISOLATED  MRSA PCR Screening     Status: None   Collection Time: 03/15/14  5:19 PM  Result Value Ref Range Status   MRSA by PCR NEGATIVE NEGATIVE Final    Comment:        The GeneXpert MRSA Assay (FDA approved for NASAL specimens  only), is one  component of a comprehensive MRSA colonization surveillance program. It is not intended to diagnose MRSA infection nor to guide or monitor treatment for MRSA infections.      Labs: Basic Metabolic Panel:  Recent Labs Lab 03/15/14 1140 03/16/14 0514 03/18/14 0646 03/18/14 1734 03/20/14 0625  NA 131* 133* 135 135 138  K 4.3 3.5 4.1 3.7 3.1*  CL 95* 100 97 100 102  CO2 $Re'30 26 30 28 29  'oOp$ GLUCOSE 192* 134* 264* 343* 95  BUN $Re'12 9 8 9 9  'EHP$ CREATININE 0.81 0.74 0.77 0.78 0.53  CALCIUM 8.6 8.0* 8.7 8.2* 8.7   Liver Function Tests:  Recent Labs Lab 03/18/14 1734  AST 17  ALT 9  ALKPHOS 137*  BILITOT 0.4  PROT 6.3  ALBUMIN 2.3*   No results for input(s): LIPASE, AMYLASE in the last 168 hours. No results for input(s): AMMONIA in the last 168 hours. CBC:  Recent Labs Lab 03/15/14 1140 03/18/14 0646 03/18/14 1734  WBC 14.9* 19.6* 27.8*  NEUTROABS 13.7* 17.1* 25.3*  HGB 12.1* 12.8* 10.9*  HCT 37.0* 39.8 33.8*  MCV 71.7* 71.7* 71.6*  PLT 461* 477* 421*   Cardiac Enzymes: No results for input(s): CKTOTAL, CKMB, CKMBINDEX, TROPONINI in the last 168 hours. BNP: BNP (last 3 results) No results for input(s): PROBNP in the last 8760 hours. CBG:  Recent Labs Lab 03/19/14 2120 03/20/14 0027 03/20/14 0357 03/20/14 0843 03/20/14 1149  GLUCAP 248* 224* 133* 84 184*       Signed:  Jerry Clyne, JAI-GURMUKH  [facility] 03/20/2014, 3:20 PM

## 2014-03-21 LAB — CULTURE, BLOOD (ROUTINE X 2)
CULTURE: NO GROWTH
Culture: NO GROWTH

## 2014-03-23 NOTE — Progress Notes (Signed)
UR chart review completed.  

## 2014-03-26 ENCOUNTER — Ambulatory Visit (HOSPITAL_COMMUNITY): Payer: Medicare Other | Admitting: Hematology & Oncology

## 2014-04-08 ENCOUNTER — Ambulatory Visit: Payer: Medicare Other | Admitting: Nurse Practitioner

## 2014-04-08 ENCOUNTER — Telehealth: Payer: Self-pay | Admitting: Nurse Practitioner

## 2014-04-08 ENCOUNTER — Encounter: Payer: Self-pay | Admitting: Nurse Practitioner

## 2014-04-08 NOTE — Telephone Encounter (Signed)
Noted  

## 2014-04-08 NOTE — Telephone Encounter (Signed)
PATIENT WAS A NO SHOW 04/08/14 AND LETTER WAS SENT  °

## 2014-04-20 DEATH — deceased

## 2014-04-22 ENCOUNTER — Telehealth: Payer: Self-pay | Admitting: Gastroenterology

## 2014-04-22 ENCOUNTER — Encounter: Payer: Self-pay | Admitting: Gastroenterology

## 2014-04-22 NOTE — Telephone Encounter (Signed)
I called [facility] to set up OV, but he is not a resident there. I have been unable to reach him by phone. I mailed appointment letter for OV 2/17 at 1030 with EG

## 2014-04-22 NOTE — Telephone Encounter (Signed)
Noted  

## 2014-04-22 NOTE — Telephone Encounter (Signed)
PATH REVIEWED. PT HAS NOT SEEN ONCOLOGY AS AN OUTPATIENT. PT DECEASED JAN 4.  PT A NO SHOW FOR JAN 2016 [facility]. CALLED WORK CONTACT-PT NOT EMPLOYED AT NUMBER. [phone] BUSY. TRIED; [id number]

## 2014-05-06 ENCOUNTER — Ambulatory Visit: Payer: Medicare Other | Admitting: Nurse Practitioner

## 2015-02-09 IMAGING — CT CT ABD-PELV W/ CM
2 of 5 series · 13 of 46 positions shown, 15 images · IV contrast (Omnipaque 300)
Comparison: CT scan of August 19, 2009.

CLINICAL DATA: Malignancy.

EXAM:
CT ABDOMEN AND PELVIS WITH CONTRAST
TECHNIQUE: Multidetector CT imaging of the abdomen and pelvis was performed
using the standard protocol following bolus administration of
intravenous contrast.
CONTRAST:  50mL OMNIPAQUE IOHEXOL 300 MG/ML SOLN, 100mL OMNIPAQUE
IOHEXOL 300 MG/ML SOLN

[Series 2: abd_pel_with 5.0 b40f · axial · 0.61mm/px · z∈[-459,+6]mm · 10 of 105 slices shown, 12 images]
[im 6/105  soft-tissue]
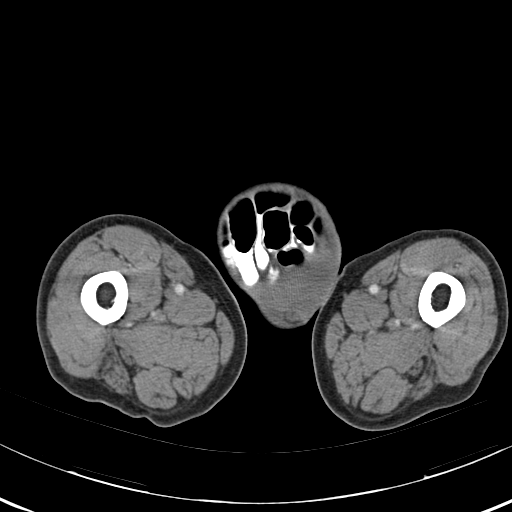
[im 6/105  bone]
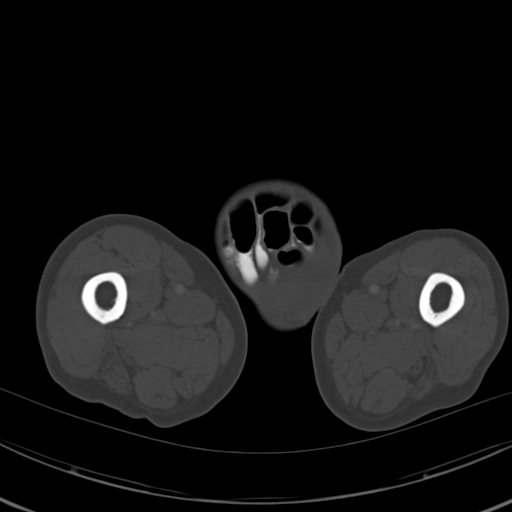
[im 16/105  soft-tissue]
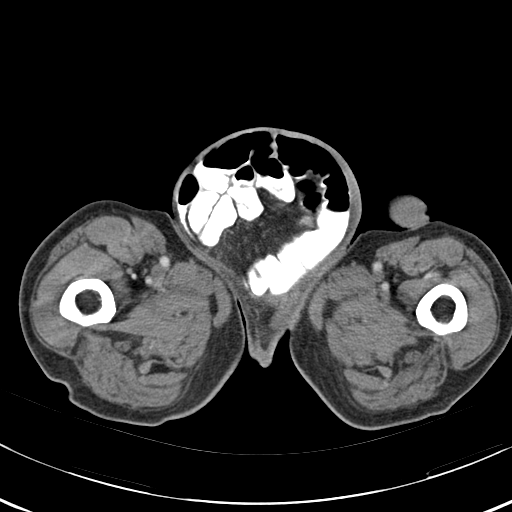
[im 27/105  soft-tissue]
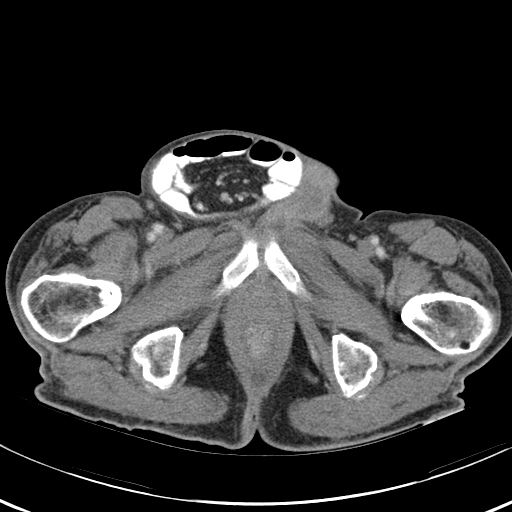
[im 37/105  soft-tissue]
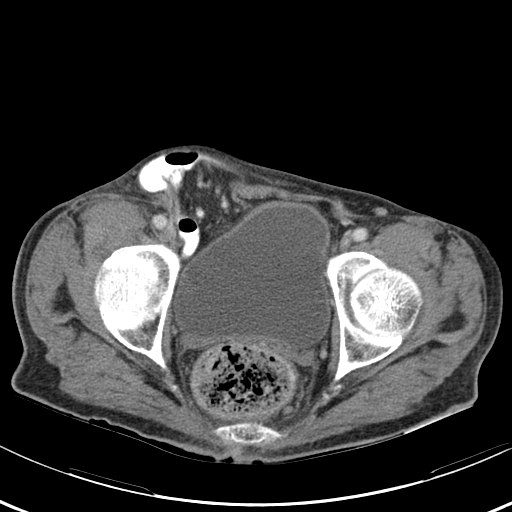
[im 47/105  soft-tissue]
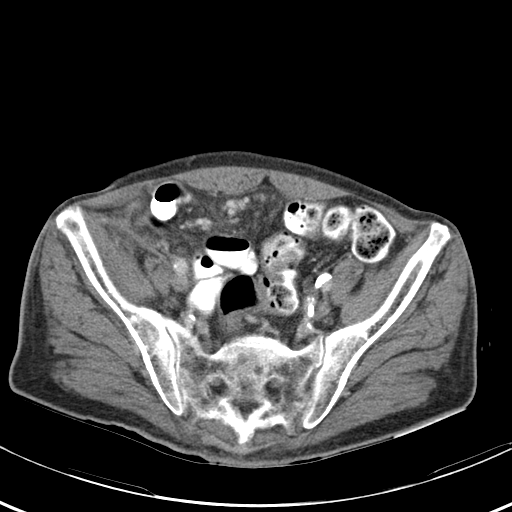
[im 58/105  soft-tissue]
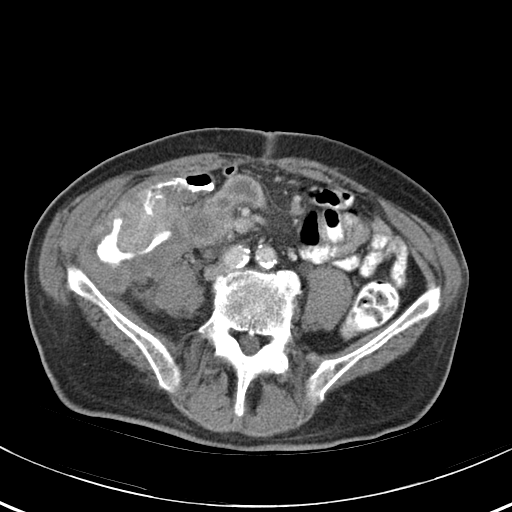
[im 68/105  soft-tissue]
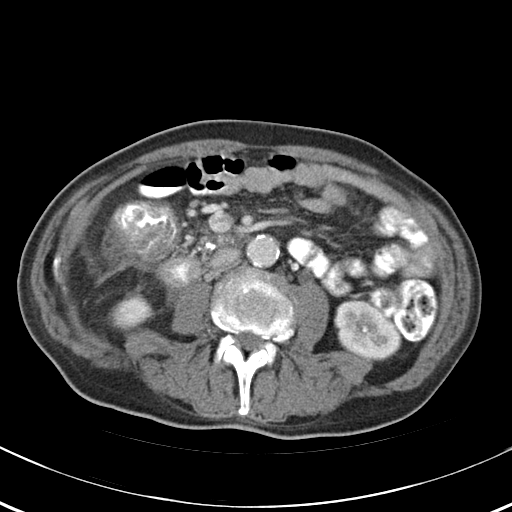
[im 79/105  soft-tissue]
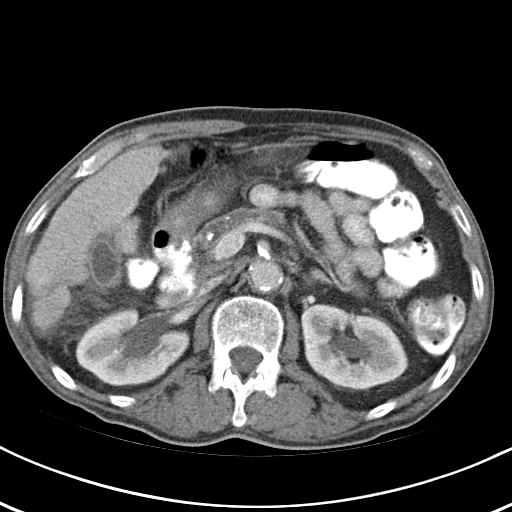
[im 89/105  soft-tissue]
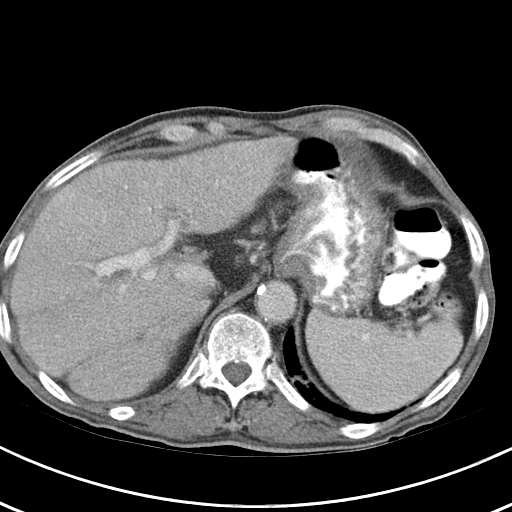
[im 89/105  bone]
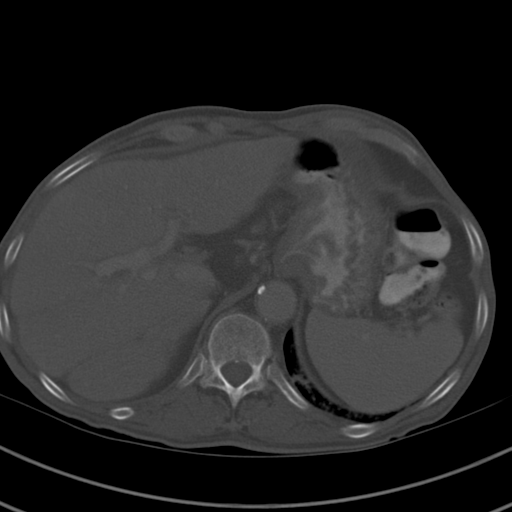
[im 99/105  soft-tissue]
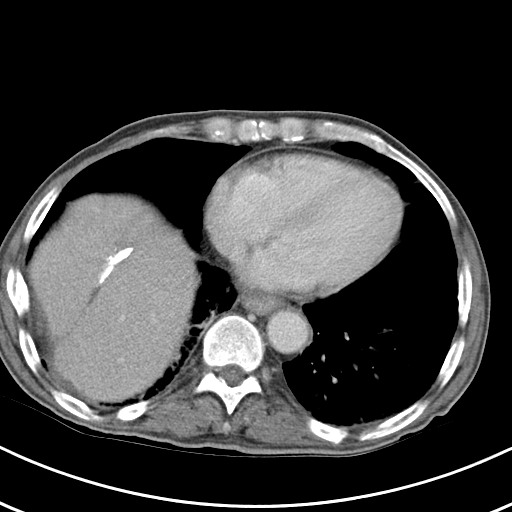

[Series 4: abd_pel_with 3.0 spo · coronal · 0.62mm/px · 3 of 74 slices shown]
[im 25/74  soft-tissue]
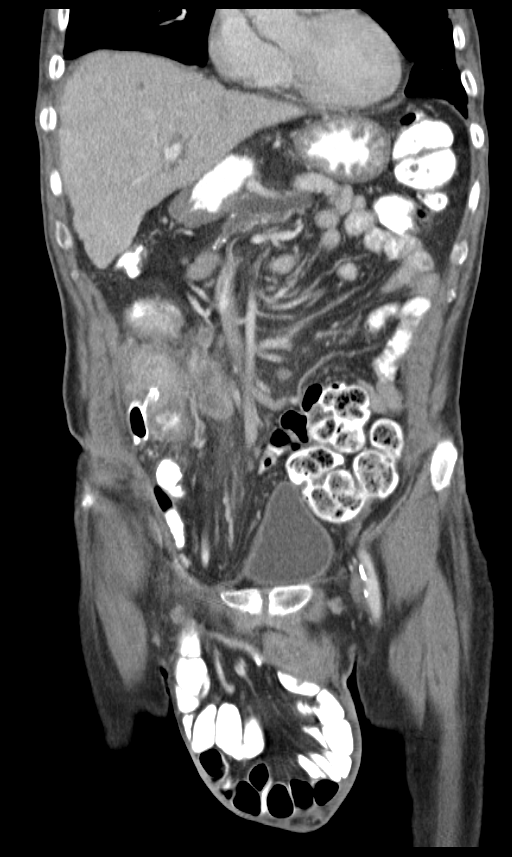
[im 33/74  soft-tissue]
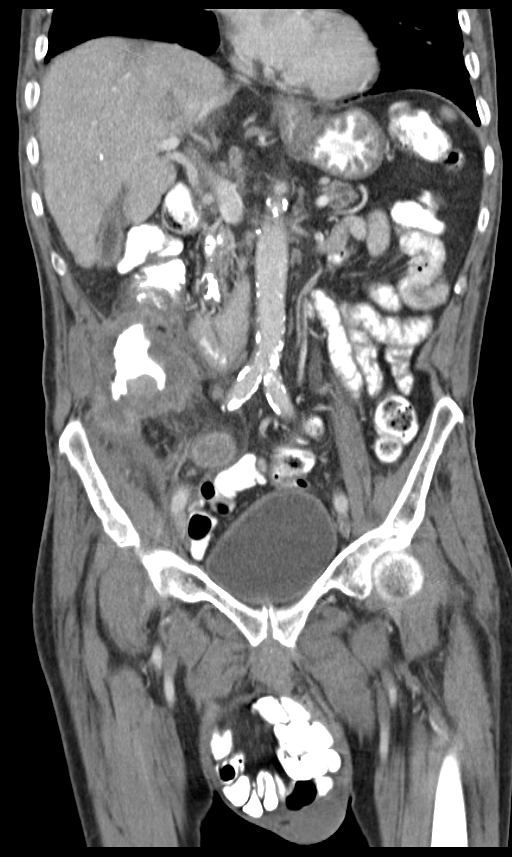
[im 41/74  soft-tissue]
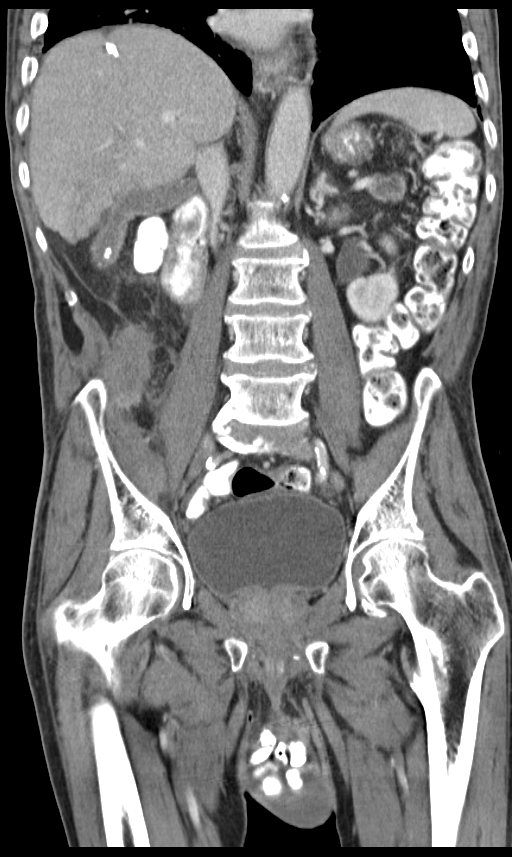

[13 of 46 positions shown; findings below may reference images not displayed]

FINDINGS: 23 x 16 mm irregular density is noted posteriorly in the right lower
lobe concerning for metastatic disease. Smaller nodule is noted more
anteriorly in the right lower lobe. Old right rib fractures are
noted. No other significant osseous abnormality seen in the
visualized portion of the abdomen or pelvis.

Hepatic cirrhosis is again noted with calcification present within
right hepatic cleft. Mild cholelithiasis is noted. The spleen
appears normal. Pancreatic head calcifications are noted suggesting
chronic pancreatitis. There is interval development of severe
pancreatic ductal dilatation suggesting obstruction. No pancreatic
mass is noted. No biliary dilatation is noted. 2.1 cm ill-defined
low density is noted in the medial portion the right hepatic lobe
concerning for metastatic disease or malignancy. Adrenal glands
appear normal. Nonobstructive calculus is noted in lower pole
collecting system of left kidney. Bilateral hydronephrosis is noted
without evidence of ureteral calculus. Mild urinary bladder
distention is noted.

Large irregular mass measuring 7.1 x 4.9 cm is seen involving the
cecum consistent with colonic malignancy. Multiple enlarged
satellite masses or lesions are noted concerning for metastatic
disease in the right lower quadrant mesenteric region. The largest
measures 3.3 x 2.6 cm in size. There is no evidence of bowel
obstruction. Stool is noted in the rectum. No abnormal fluid
collection is noted.

Atherosclerotic calcifications of abdominal aorta are noted without
aneurysm formation.

Very large right inguinal hernia is noted which contains loops of
small bowel, but does not result in incarceration or obstruction.
This extends into the scrotum. Is significantly increased in size
compared to prior exam.
IMPRESSION: Large right inguinal hernia is noted which is significantly
increased in size compared to prior exam, which extends into the
scrotum. And contains loops of small bowel, but does not result in
incarceration or obstruction.

Hepatic cirrhosis is noted. 2.1 cm ill-defined low density is noted
in the medial portion of the right hepatic lobe consistent with
metastatic disease or malignancy.

Mild cholelithiasis.

2.3 cm irregular density seen posteriorly in the right lower lobe
concerning for metastatic disease.

Large irregular mass is seen involving the cecum consistent with
colonic malignancy. Multiple and large satellite lesions are masses
are noted in the right lower quadrant mesenteric region consistent
with metastatic disease.

Nonobstructive left nephrolithiasis is noted.

Bilateral hydronephrosis is noted without evidence of obstructing
calculus. This is concerning for distal ureteral obstruction of
unknown etiology, or potentially may be due to urinary bladder
distention.

Pancreatic head calcifications are noted suggesting chronic
pancreatitis. There is interval development of severe dilatation of
the pancreatic duct without common bile duct dilatation. This is
concerning for ductal obstruction.
